# Patient Record
Sex: Male | Born: 1935 | Race: White | Hispanic: No | Marital: Married | Smoking: Former smoker
Health system: Southern US, Community
[De-identification: ages and names within clinical notes are randomized; demographics above are authoritative.]

## PROBLEM LIST (undated history)

## (undated) DIAGNOSIS — N400 Enlarged prostate without lower urinary tract symptoms: Secondary | ICD-10-CM

## (undated) DIAGNOSIS — Z8619 Personal history of other infectious and parasitic diseases: Secondary | ICD-10-CM

## (undated) DIAGNOSIS — N200 Calculus of kidney: Secondary | ICD-10-CM

## (undated) DIAGNOSIS — D494 Neoplasm of unspecified behavior of bladder: Secondary | ICD-10-CM

## (undated) DIAGNOSIS — N189 Chronic kidney disease, unspecified: Secondary | ICD-10-CM

## (undated) DIAGNOSIS — E878 Other disorders of electrolyte and fluid balance, not elsewhere classified: Secondary | ICD-10-CM

## (undated) DIAGNOSIS — R011 Cardiac murmur, unspecified: Secondary | ICD-10-CM

## (undated) DIAGNOSIS — M1990 Unspecified osteoarthritis, unspecified site: Secondary | ICD-10-CM

## (undated) DIAGNOSIS — C801 Malignant (primary) neoplasm, unspecified: Secondary | ICD-10-CM

## (undated) DIAGNOSIS — R319 Hematuria, unspecified: Secondary | ICD-10-CM

## (undated) DIAGNOSIS — K219 Gastro-esophageal reflux disease without esophagitis: Secondary | ICD-10-CM

## (undated) DIAGNOSIS — R972 Elevated prostate specific antigen [PSA]: Secondary | ICD-10-CM

## (undated) DIAGNOSIS — Z7901 Long term (current) use of anticoagulants: Secondary | ICD-10-CM

## (undated) DIAGNOSIS — R55 Syncope and collapse: Secondary | ICD-10-CM

## (undated) DIAGNOSIS — I5042 Chronic combined systolic (congestive) and diastolic (congestive) heart failure: Secondary | ICD-10-CM

## (undated) DIAGNOSIS — I10 Essential (primary) hypertension: Secondary | ICD-10-CM

## (undated) DIAGNOSIS — R339 Retention of urine, unspecified: Secondary | ICD-10-CM

## (undated) DIAGNOSIS — Z85828 Personal history of other malignant neoplasm of skin: Secondary | ICD-10-CM

## (undated) DIAGNOSIS — R569 Unspecified convulsions: Secondary | ICD-10-CM

## (undated) DIAGNOSIS — C4490 Unspecified malignant neoplasm of skin, unspecified: Secondary | ICD-10-CM

## (undated) DIAGNOSIS — N4889 Other specified disorders of penis: Secondary | ICD-10-CM

## (undated) DIAGNOSIS — Z87442 Personal history of urinary calculi: Secondary | ICD-10-CM

## (undated) DIAGNOSIS — M109 Gout, unspecified: Secondary | ICD-10-CM

## (undated) DIAGNOSIS — I482 Chronic atrial fibrillation, unspecified: Secondary | ICD-10-CM

## (undated) DIAGNOSIS — R8281 Pyuria: Secondary | ICD-10-CM

## (undated) DIAGNOSIS — S3730XA Unspecified injury of urethra, initial encounter: Secondary | ICD-10-CM

## (undated) HISTORY — DX: Unspecified malignant neoplasm of skin, unspecified: C44.90

## (undated) HISTORY — DX: Chronic atrial fibrillation, unspecified: I48.20

## (undated) HISTORY — DX: Calculus of kidney: N20.0

## (undated) HISTORY — DX: Gout, unspecified: M10.9

## (undated) HISTORY — DX: Hematuria, unspecified: R31.9

## (undated) HISTORY — DX: Retention of urine, unspecified: R33.9

## (undated) HISTORY — DX: Other specified disorders of penis: N48.89

## (undated) HISTORY — DX: Elevated prostate specific antigen (PSA): R97.20

## (undated) HISTORY — DX: Benign prostatic hyperplasia without lower urinary tract symptoms: N40.0

## (undated) HISTORY — DX: Pyuria: R82.81

## (undated) HISTORY — DX: Long term (current) use of anticoagulants: Z79.01

## (undated) HISTORY — DX: Other disorders of electrolyte and fluid balance, not elsewhere classified: E87.8

## (undated) HISTORY — DX: Cardiac murmur, unspecified: R01.1

## (undated) HISTORY — DX: Unspecified osteoarthritis, unspecified site: M19.90

## (undated) HISTORY — DX: Personal history of urinary calculi: Z87.442

## (undated) HISTORY — DX: Syncope and collapse: R55

## (undated) HISTORY — PX: SKIN BIOPSY: SHX1

## (undated) HISTORY — DX: Unspecified injury of urethra, initial encounter: S37.30XA

## (undated) HISTORY — PX: CATARACT EXTRACTION: SUR2

## (undated) HISTORY — DX: Gastro-esophageal reflux disease without esophagitis: K21.9

## (undated) HISTORY — DX: Essential (primary) hypertension: I10

---

## 1985-01-09 HISTORY — PX: AORTIC VALVE REPLACEMENT: SHX41

## 2008-11-15 ENCOUNTER — Emergency Department (HOSPITAL_COMMUNITY): Admission: EM | Admit: 2008-11-15 | Discharge: 2008-11-15 | Payer: Self-pay | Admitting: Emergency Medicine

## 2009-11-08 ENCOUNTER — Ambulatory Visit: Payer: Self-pay | Admitting: Cardiology

## 2009-11-08 ENCOUNTER — Inpatient Hospital Stay (HOSPITAL_COMMUNITY): Admission: AD | Admit: 2009-11-08 | Discharge: 2009-11-12 | Payer: Self-pay | Admitting: Cardiology

## 2009-11-09 ENCOUNTER — Encounter: Payer: Self-pay | Admitting: Cardiology

## 2009-11-15 ENCOUNTER — Ambulatory Visit: Payer: Self-pay | Admitting: Cardiology

## 2009-11-19 ENCOUNTER — Ambulatory Visit: Payer: Self-pay | Admitting: Cardiology

## 2009-11-25 ENCOUNTER — Ambulatory Visit: Payer: Self-pay | Admitting: Cardiology

## 2009-12-03 ENCOUNTER — Ambulatory Visit: Payer: Self-pay | Admitting: Cardiovascular Disease

## 2009-12-13 ENCOUNTER — Ambulatory Visit: Payer: Self-pay | Admitting: Cardiology

## 2009-12-24 ENCOUNTER — Ambulatory Visit: Payer: Self-pay | Admitting: Cardiology

## 2010-01-05 ENCOUNTER — Ambulatory Visit: Payer: Self-pay | Admitting: Cardiology

## 2010-02-28 ENCOUNTER — Ambulatory Visit (INDEPENDENT_AMBULATORY_CARE_PROVIDER_SITE_OTHER): Payer: Medicare Other | Admitting: *Deleted

## 2010-02-28 DIAGNOSIS — Z954 Presence of other heart-valve replacement: Secondary | ICD-10-CM

## 2010-02-28 DIAGNOSIS — I4891 Unspecified atrial fibrillation: Secondary | ICD-10-CM

## 2010-02-28 DIAGNOSIS — Z7901 Long term (current) use of anticoagulants: Secondary | ICD-10-CM

## 2010-02-28 DIAGNOSIS — E7800 Pure hypercholesterolemia, unspecified: Secondary | ICD-10-CM

## 2010-03-22 LAB — PROTIME-INR
INR: 2.94 — ABNORMAL HIGH (ref 0.00–1.49)
INR: 3.04 — ABNORMAL HIGH (ref 0.00–1.49)
INR: 3.2 — ABNORMAL HIGH (ref 0.00–1.49)
INR: 3.2 — ABNORMAL HIGH (ref 0.00–1.49)
Prothrombin Time: 30.7 seconds — ABNORMAL HIGH (ref 11.6–15.2)
Prothrombin Time: 31.5 seconds — ABNORMAL HIGH (ref 11.6–15.2)
Prothrombin Time: 32.8 seconds — ABNORMAL HIGH (ref 11.6–15.2)
Prothrombin Time: 32.8 seconds — ABNORMAL HIGH (ref 11.6–15.2)

## 2010-03-22 LAB — BASIC METABOLIC PANEL
BUN: 31 mg/dL — ABNORMAL HIGH (ref 6–23)
BUN: 31 mg/dL — ABNORMAL HIGH (ref 6–23)
BUN: 33 mg/dL — ABNORMAL HIGH (ref 6–23)
CO2: 33 mEq/L — ABNORMAL HIGH (ref 19–32)
CO2: 38 mEq/L — ABNORMAL HIGH (ref 19–32)
Calcium: 8.7 mg/dL (ref 8.4–10.5)
Calcium: 8.7 mg/dL (ref 8.4–10.5)
Chloride: 100 mEq/L (ref 96–112)
Chloride: 101 mEq/L (ref 96–112)
Chloride: 98 mEq/L (ref 96–112)
Creatinine, Ser: 1.1 mg/dL (ref 0.4–1.5)
Creatinine, Ser: 1.34 mg/dL (ref 0.4–1.5)
Creatinine, Ser: 1.41 mg/dL (ref 0.4–1.5)
GFR calc Af Amer: 57 mL/min — ABNORMAL LOW (ref >60)
GFR calc Af Amer: 59 mL/min — ABNORMAL LOW (ref >60)
GFR calc non Af Amer: 47 mL/min — ABNORMAL LOW (ref >60)
GFR calc non Af Amer: 50 mL/min — ABNORMAL LOW (ref >60)
GFR calc non Af Amer: >60 mL/min (ref >60)
Glucose, Bld: 104 mg/dL — ABNORMAL HIGH (ref 70–99)
Glucose, Bld: 111 mg/dL — ABNORMAL HIGH (ref 70–99)
Glucose, Bld: 116 mg/dL — ABNORMAL HIGH (ref 70–99)
Glucose, Bld: 118 mg/dL — ABNORMAL HIGH (ref 70–99)
Potassium: 3.4 mEq/L — ABNORMAL LOW (ref 3.5–5.1)
Potassium: 3.7 mEq/L (ref 3.5–5.1)
Potassium: 3.9 mEq/L (ref 3.5–5.1)
Sodium: 141 mEq/L (ref 135–145)
Sodium: 142 mEq/L (ref 135–145)
Sodium: 143 mEq/L (ref 135–145)
Sodium: 144 mEq/L (ref 135–145)

## 2010-03-22 LAB — BRAIN NATRIURETIC PEPTIDE
Pro B Natriuretic peptide (BNP): 160 pg/mL — ABNORMAL HIGH (ref 0.0–100.0)
Pro B Natriuretic peptide (BNP): 260 pg/mL — ABNORMAL HIGH (ref 0.0–100.0)
Pro B Natriuretic peptide (BNP): 261 pg/mL — ABNORMAL HIGH (ref 0.0–100.0)
Pro B Natriuretic peptide (BNP): 373 pg/mL — ABNORMAL HIGH (ref 0.0–100.0)

## 2010-03-22 LAB — CARDIAC PANEL(CRET KIN+CKTOT+MB+TROPI)
CK, MB: 5.3 ng/mL — ABNORMAL HIGH (ref 0.3–4.0)
Relative Index: 3.9 — ABNORMAL HIGH (ref 0.0–2.5)
Troponin I: 0.02 ng/mL (ref 0.00–0.06)

## 2010-03-23 LAB — TSH: TSH: 1.752 u[IU]/mL (ref 0.350–4.500)

## 2010-03-23 LAB — CARDIAC PANEL(CRET KIN+CKTOT+MB+TROPI)
CK, MB: 6 ng/mL — ABNORMAL HIGH (ref 0.3–4.0)
CK, MB: 6.3 ng/mL (ref 0.3–4.0)
Relative Index: 3.9 — ABNORMAL HIGH (ref 0.0–2.5)
Total CK: 155 U/L (ref 7–232)
Total CK: 157 U/L (ref 7–232)
Troponin I: 0.03 ng/mL (ref 0.00–0.06)

## 2010-03-23 LAB — COMPREHENSIVE METABOLIC PANEL
ALT: 19 U/L (ref 0–53)
AST: 20 U/L (ref 0–37)
Albumin: 3.4 g/dL — ABNORMAL LOW (ref 3.5–5.2)
Alkaline Phosphatase: 93 U/L (ref 39–117)
BUN: 36 mg/dL — ABNORMAL HIGH (ref 6–23)
CO2: 32 mEq/L (ref 19–32)
Chloride: 107 mEq/L (ref 96–112)
Creatinine, Ser: 1.02 mg/dL (ref 0.4–1.5)
GFR calc Af Amer: >60 mL/min (ref >60)
GFR calc non Af Amer: >60 mL/min (ref >60)
Glucose, Bld: 115 mg/dL — ABNORMAL HIGH (ref 70–99)
Sodium: 145 mEq/L (ref 135–145)
Total Bilirubin: 1 mg/dL (ref 0.3–1.2)
Total Protein: 7 g/dL (ref 6.0–8.3)

## 2010-03-23 LAB — BRAIN NATRIURETIC PEPTIDE: Pro B Natriuretic peptide (BNP): 759 pg/mL — ABNORMAL HIGH (ref 0.0–100.0)

## 2010-03-23 LAB — CBC
HCT: 33.9 % — ABNORMAL LOW (ref 39.0–52.0)
Hemoglobin: 10.2 g/dL — ABNORMAL LOW (ref 13.0–17.0)
MCH: 28.3 pg (ref 26.0–34.0)
MCHC: 30.1 g/dL (ref 30.0–36.0)
MCV: 94.2 fL (ref 78.0–100.0)
Platelets: 186 10*3/uL (ref 150–400)
RBC: 3.6 MIL/uL — ABNORMAL LOW (ref 4.22–5.81)
WBC: 9.1 10*3/uL (ref 4.0–10.5)

## 2010-03-23 LAB — APTT: aPTT: 45 seconds — ABNORMAL HIGH (ref 24–37)

## 2010-03-23 LAB — PROTIME-INR: INR: 3.09 — ABNORMAL HIGH (ref 0.00–1.49)

## 2010-04-13 LAB — URINE CULTURE
Colony Count: NO GROWTH
Culture: NO GROWTH

## 2010-04-13 LAB — URINALYSIS, ROUTINE W REFLEX MICROSCOPIC
Glucose, UA: NEGATIVE mg/dL
Ketones, ur: 15 mg/dL — AB
Nitrite: POSITIVE — AB
Protein, ur: >300 mg/dL — AB
Specific Gravity, Urine: 1.027 (ref 1.005–1.030)
Urobilinogen, UA: 1 mg/dL (ref 0.0–1.0)
pH: 5 (ref 5.0–8.0)

## 2010-04-13 LAB — URINE MICROSCOPIC-ADD ON

## 2010-04-13 LAB — PROTIME-INR
INR: 2.33 — ABNORMAL HIGH (ref 0.00–1.49)
Prothrombin Time: 25.4 s — ABNORMAL HIGH (ref 11.6–15.2)

## 2010-06-10 ENCOUNTER — Other Ambulatory Visit: Payer: Self-pay | Admitting: *Deleted

## 2010-06-10 DIAGNOSIS — E785 Hyperlipidemia, unspecified: Secondary | ICD-10-CM

## 2010-06-10 MED ORDER — CARVEDILOL 6.25 MG PO TABS: 6.2500 mg | ORAL_TABLET | Freq: Two times a day (BID) | ORAL | Status: DC

## 2010-06-10 MED ORDER — SIMVASTATIN 10 MG PO TABS: 10.0000 mg | ORAL_TABLET | Freq: Every evening | ORAL | Status: DC

## 2010-06-13 ENCOUNTER — Other Ambulatory Visit (INDEPENDENT_AMBULATORY_CARE_PROVIDER_SITE_OTHER): Payer: Medicare Other | Admitting: *Deleted

## 2010-06-13 DIAGNOSIS — E785 Hyperlipidemia, unspecified: Secondary | ICD-10-CM

## 2010-06-13 LAB — BASIC METABOLIC PANEL
CO2: 32 mEq/L (ref 19–32)
Calcium: 8.7 mg/dL (ref 8.4–10.5)
GFR: 59.49 mL/min — ABNORMAL LOW (ref >60.00)
Potassium: 4.4 mEq/L (ref 3.5–5.1)
Sodium: 140 mEq/L (ref 135–145)

## 2010-06-13 LAB — HEPATIC FUNCTION PANEL
Alkaline Phosphatase: 85 U/L (ref 39–117)
Bilirubin, Direct: 0.2 mg/dL (ref 0.0–0.3)
Total Protein: 7 g/dL (ref 6.0–8.3)

## 2010-06-13 LAB — LIPID PANEL
HDL: 41.8 mg/dL (ref >39.00)
Total CHOL/HDL Ratio: 3
VLDL: 15.2 mg/dL (ref 0.0–40.0)

## 2010-06-17 ENCOUNTER — Telehealth: Payer: Self-pay | Admitting: *Deleted

## 2010-06-17 NOTE — Telephone Encounter (Signed)
Left message on home # that labs are ok and to have repeat labs in six months.  Pt told to call with any concerns.

## 2010-06-17 NOTE — Telephone Encounter (Signed)
Message copied by Adolphus Birchwood on Fri Jun 17, 2010  1:15 PM ------      Message from: Roger Shelter      Created: Wed Jun 15, 2010  8:32 AM       Ok, recheck in six months.

## 2010-06-20 ENCOUNTER — Other Ambulatory Visit: Payer: Self-pay | Admitting: *Deleted

## 2010-06-20 MED ORDER — POTASSIUM CHLORIDE CRYS ER 20 MEQ PO TBCR: 20.0000 meq | EXTENDED_RELEASE_TABLET | Freq: Two times a day (BID) | ORAL | Status: DC

## 2010-07-20 ENCOUNTER — Other Ambulatory Visit: Payer: Self-pay | Admitting: Cardiology

## 2010-07-20 NOTE — Telephone Encounter (Signed)
Med refill

## 2010-08-15 ENCOUNTER — Encounter: Payer: Self-pay | Admitting: Nurse Practitioner

## 2010-08-22 ENCOUNTER — Other Ambulatory Visit: Payer: PRIVATE HEALTH INSURANCE | Admitting: *Deleted

## 2010-08-22 ENCOUNTER — Ambulatory Visit (INDEPENDENT_AMBULATORY_CARE_PROVIDER_SITE_OTHER): Payer: Medicare Other | Admitting: Nurse Practitioner

## 2010-08-22 ENCOUNTER — Encounter: Payer: Self-pay | Admitting: Nurse Practitioner

## 2010-08-22 DIAGNOSIS — I4891 Unspecified atrial fibrillation: Secondary | ICD-10-CM

## 2010-08-22 DIAGNOSIS — I5020 Unspecified systolic (congestive) heart failure: Secondary | ICD-10-CM

## 2010-08-22 DIAGNOSIS — E785 Hyperlipidemia, unspecified: Secondary | ICD-10-CM

## 2010-08-22 DIAGNOSIS — I359 Nonrheumatic aortic valve disorder, unspecified: Secondary | ICD-10-CM

## 2010-08-22 DIAGNOSIS — R0600 Dyspnea, unspecified: Secondary | ICD-10-CM

## 2010-08-22 DIAGNOSIS — I482 Chronic atrial fibrillation, unspecified: Secondary | ICD-10-CM

## 2010-08-22 DIAGNOSIS — I5032 Chronic diastolic (congestive) heart failure: Secondary | ICD-10-CM | POA: Insufficient documentation

## 2010-08-22 DIAGNOSIS — Z952 Presence of prosthetic heart valve: Secondary | ICD-10-CM | POA: Insufficient documentation

## 2010-08-22 DIAGNOSIS — R0609 Other forms of dyspnea: Secondary | ICD-10-CM

## 2010-08-22 NOTE — Progress Notes (Signed)
    Eric Wheeler Date of Birth: April 20, 1919   History of Present Illness: Eric Wheeler is seen today for his 4 month check. He is seen for Dr. Antoine Poche. He is a former patient of Dr. Ronnald Nian. He is 77 years old. He is doing well. He gets short winded if he overdoes, but it seems to be at baseline. No chest pain. He is not dizzy. He has had a flare up of his gout. He is on coumadin and gets his protimes with Dr. Jeannetta Nap. He does bruise easily. No active bleeding reported. His weight is staying good at home. He is tolerating his medicines.   Current Outpatient Prescriptions on File Prior to Visit  Medication Sig Dispense Refill  . carvedilol (COREG) 6.25 MG tablet Take 1 tablet (6.25 mg total) by mouth 2 (two) times daily.  60 tablet  6  . diltiazem (CARDIZEM CD) 120 MG 24 hr capsule Take 120 mg by mouth daily.        . furosemide (LASIX) 40 MG tablet Take 40 mg by mouth daily.        . potassium chloride SA (K-DUR,KLOR-CON) 20 MEQ tablet Take 1 tablet (20 mEq total) by mouth 2 (two) times daily.  60 tablet  6  . simvastatin (ZOCOR) 10 MG tablet TAKE ONE TABLET BY MOUTH IN THE EVENING  30 tablet  5  . terazosin (HYTRIN) 5 MG capsule Take 5 mg by mouth at bedtime.        [name] warfarin (COUMADIN) 5 MG tablet Take 5 mg by mouth as directed.          Allergies  Allergen Reactions  . Codeine   . Sulfa Drugs Cross Reactors     Past Medical History  Diagnosis Date  . Chronic atrial fibrillation   . Chronic anticoagulation     followed by Dr. Jeannetta Nap  . BPH (benign prostatic hypertrophy)   . Hypertension   . History of kidney stones   . Systolic heart failure     EF 45 to 50% per echo in 2011  . BPH (benign prostatic hyperplasia)   . S/P AVR 1987    Dr. Andrey Campanile    Past Surgical History  Procedure Date  . Aortic valve replacement 1987    #21 MM ST JUDE PLACED BY DR.CHARLES WILSON  . Cataract extraction     History  Smoking status  . Former Smoker  . Quit date: 01/10/1971    Smokeless tobacco  . Not on file    History  Alcohol Use No    Family History  Problem Relation Age of Onset  . Cancer Mother   . Heart disease Father   . Asthma Father   . Stroke Father     Review of Systems: The review of systems is positive for recent flare up of gout.  All other systems were reviewed and are negative.  Physical Exam: BP 120/74  Pulse 86  Ht 5\' 9"  (1.753 m)  Wt 173 lb 3.2 oz (78.563 kg)  BMI 25.58 kg/m2  LABORATORY DATA: n/a   Assessment / Plan:

## 2010-08-22 NOTE — Assessment & Plan Note (Signed)
He looks compensated. Weights are stable at home. No change in his therapy. He has not tolerated ACE in the past due to dizziness. I will see him back in about 4 months. Patient is agreeable to this plan and will call if any problems develop in the interim.

## 2010-08-22 NOTE — Patient Instructions (Addendum)
Stay on your current medicines I will see you back in 4 months. We will check labs at that time. Come fasting. Call for any problems.

## 2010-08-22 NOTE — Assessment & Plan Note (Signed)
Rate is controlled. No change in his medicines. Protimes are monitored by primary care.

## 2010-08-22 NOTE — Assessment & Plan Note (Signed)
Remote AVR in 1987. Last echo was in 2011.

## 2010-11-17 ENCOUNTER — Other Ambulatory Visit: Payer: Self-pay | Admitting: Physician Assistant

## 2010-11-17 ENCOUNTER — Other Ambulatory Visit: Payer: Self-pay | Admitting: *Deleted

## 2010-12-22 ENCOUNTER — Other Ambulatory Visit: Payer: Medicare Other | Admitting: *Deleted

## 2010-12-22 ENCOUNTER — Ambulatory Visit (INDEPENDENT_AMBULATORY_CARE_PROVIDER_SITE_OTHER): Payer: Medicare Other | Admitting: Nurse Practitioner

## 2010-12-22 ENCOUNTER — Encounter: Payer: Self-pay | Admitting: Nurse Practitioner

## 2010-12-22 ENCOUNTER — Other Ambulatory Visit (INDEPENDENT_AMBULATORY_CARE_PROVIDER_SITE_OTHER): Payer: Medicare Other | Admitting: *Deleted

## 2010-12-22 DIAGNOSIS — I4891 Unspecified atrial fibrillation: Secondary | ICD-10-CM

## 2010-12-22 DIAGNOSIS — I359 Nonrheumatic aortic valve disorder, unspecified: Secondary | ICD-10-CM

## 2010-12-22 DIAGNOSIS — Z952 Presence of prosthetic heart valve: Secondary | ICD-10-CM

## 2010-12-22 DIAGNOSIS — R0600 Dyspnea, unspecified: Secondary | ICD-10-CM

## 2010-12-22 DIAGNOSIS — E785 Hyperlipidemia, unspecified: Secondary | ICD-10-CM

## 2010-12-22 DIAGNOSIS — I5020 Unspecified systolic (congestive) heart failure: Secondary | ICD-10-CM

## 2010-12-22 DIAGNOSIS — R0989 Other specified symptoms and signs involving the circulatory and respiratory systems: Secondary | ICD-10-CM

## 2010-12-22 DIAGNOSIS — R0609 Other forms of dyspnea: Secondary | ICD-10-CM

## 2010-12-22 DIAGNOSIS — I482 Chronic atrial fibrillation, unspecified: Secondary | ICD-10-CM

## 2010-12-22 LAB — BASIC METABOLIC PANEL
BUN: 29 mg/dL — ABNORMAL HIGH (ref 6–23)
CO2: 30 mEq/L (ref 19–32)
Calcium: 8.8 mg/dL (ref 8.4–10.5)
Chloride: 103 mEq/L (ref 96–112)
Creatinine, Ser: 1.3 mg/dL (ref 0.4–1.5)
GFR: 53.32 mL/min — ABNORMAL LOW (ref >60.00)
Glucose, Bld: 106 mg/dL — ABNORMAL HIGH (ref 70–99)
Potassium: 4.2 mEq/L (ref 3.5–5.1)
Sodium: 140 mEq/L (ref 135–145)

## 2010-12-22 LAB — BRAIN NATRIURETIC PEPTIDE: Pro B Natriuretic peptide (BNP): 235 pg/mL — ABNORMAL HIGH (ref 0.0–100.0)

## 2010-12-22 LAB — LIPID PANEL
Cholesterol: 146 mg/dL (ref 0–200)
HDL: 37.1 mg/dL — ABNORMAL LOW (ref >39.00)
LDL Cholesterol: 96 mg/dL (ref 0–99)
Total CHOL/HDL Ratio: 4
Triglycerides: 67 mg/dL (ref 0.0–149.0)
VLDL: 13.4 mg/dL (ref 0.0–40.0)

## 2010-12-22 LAB — HEPATIC FUNCTION PANEL
ALT: 16 U/L (ref 0–53)
AST: 17 U/L (ref 0–37)
Albumin: 3.9 g/dL (ref 3.5–5.2)
Alkaline Phosphatase: 82 U/L (ref 39–117)
Bilirubin, Direct: 0.1 mg/dL (ref 0.0–0.3)
Total Bilirubin: 0.6 mg/dL (ref 0.3–1.2)
Total Protein: 7.3 g/dL (ref 6.0–8.3)

## 2010-12-22 NOTE — Assessment & Plan Note (Signed)
Has had remote AVR. Last echo was in 2011. He is doing well for his 77 years of age.

## 2010-12-22 NOTE — Assessment & Plan Note (Signed)
His rate is controlled. He remains on his coumadin. This is managed by his PCP.

## 2010-12-22 NOTE — Assessment & Plan Note (Signed)
He looks compensated. No complaints today. We are checking labs. Will tentatively see him back in 6 months. Patient is agreeable to this plan and will call if any problems develop in the interim.

## 2010-12-22 NOTE — Patient Instructions (Signed)
Stay on your current medicines.  I think you are doing well.   We will see you back in about 6 months with Dr. Antoine Poche.   Call the [facility] office at [phone] if you have any questions, problems or concerns.

## 2010-12-22 NOTE — Progress Notes (Signed)
Eric Wheeler Date of Birth: 18-Aug-1919 Medical Record #[id number]  History of Present Illness: Eric Wheeler is seen back today for a 4 month visit. He is seen for Dr. Antoine Poche. He is a former patient of Dr. Ronnald Nian. He has chronic systolic dysfunction with an EF of 45 to 50%, prior AVR, HTN and chronic atrial fib. He was hospitalized a little over one year ago with a CHF exacerbation. He sees Dr. Jeannetta Nap for primary care. He is doing well. Has just gotten over a cold. His wife is with him today and she notes that he does a lot of things he shouldn't. He is trying to stay active. Says he is "shooting for 100". No chest pain. Dyspnea is at baseline. No adverse bruising/bleeding with his coumadin. Overall, he feels well.   Current Outpatient Prescriptions on File Prior to Visit  Medication Sig Dispense Refill  . carvedilol (COREG) 6.25 MG tablet Take 1 tablet (6.25 mg total) by mouth 2 (two) times daily.  60 tablet  6  . diltiazem (CARDIZEM CD) 120 MG 24 hr capsule Take 120 mg by mouth daily.        . furosemide (LASIX) 40 MG tablet TAKE ONE TABLET BY MOUTH TWICE DAILY  60 tablet  3  . potassium chloride SA (K-DUR,KLOR-CON) 20 MEQ tablet Take 1 tablet (20 mEq total) by mouth 2 (two) times daily.  60 tablet  6  . simvastatin (ZOCOR) 10 MG tablet TAKE ONE TABLET BY MOUTH IN THE EVENING  30 tablet  5  . terazosin (HYTRIN) 5 MG capsule Take 5 mg by mouth at bedtime.        [name] warfarin (COUMADIN) 5 MG tablet Take 5 mg by mouth as directed.          Allergies  Allergen Reactions  . Ace Inhibitors     Dizziness   . Codeine   . Sulfa Drugs Cross Reactors     Past Medical History  Diagnosis Date  . Chronic atrial fibrillation   . Chronic anticoagulation     followed by Dr. Jeannetta Nap  . BPH (benign prostatic hypertrophy)   . Hypertension   . History of kidney stones   . Systolic heart failure     EF 45 to 50% per echo in 2011  . BPH (benign prostatic hyperplasia)   . S/P AVR 1987    Dr.  Andrey Campanile    Past Surgical History  Procedure Date  . Aortic valve replacement 1987    #21 MM ST JUDE PLACED BY DR.CHARLES WILSON  . Cataract extraction     History  Smoking status  . Former Smoker  . Quit date: 01/10/1971  Smokeless tobacco  . Not on file    History  Alcohol Use No    Family History  Problem Relation Age of Onset  . Cancer Mother   . Heart disease Father   . Asthma Father   . Stroke Father     Review of Systems: The review of systems is per the HPI.  All other systems were reviewed and are negative.  Physical Exam: Pulse 76  Ht 5\' 9"  (1.753 m)  Wt 172 lb (78.019 kg)  BMI 25.40 kg/m2 Patient is very pleasant and in no acute distress. Skin is warm and dry. Color is normal.  HEENT is unremarkable. Normocephalic/atraumatic. PERRL. Sclera are nonicteric. Neck is supple. No masses. No JVD. Lungs are clear. Cardiac exam shows an irregular rhythm.Rate is controlled. Valve is crisp.  Abdomen is soft.  Extremities are without edema. Gait and ROM are intact. No gross neurologic deficits noted.  LABORATORY DATA: PENDING   Assessment / Plan:

## 2011-01-12 ENCOUNTER — Other Ambulatory Visit: Payer: Self-pay | Admitting: Nurse Practitioner

## 2011-01-13 DIAGNOSIS — I359 Nonrheumatic aortic valve disorder, unspecified: Secondary | ICD-10-CM | POA: Diagnosis not present

## 2011-01-13 DIAGNOSIS — Z7901 Long term (current) use of anticoagulants: Secondary | ICD-10-CM | POA: Diagnosis not present

## 2011-01-16 ENCOUNTER — Other Ambulatory Visit: Payer: Self-pay | Admitting: Cardiology

## 2011-02-09 DIAGNOSIS — Z7901 Long term (current) use of anticoagulants: Secondary | ICD-10-CM | POA: Diagnosis not present

## 2011-02-09 DIAGNOSIS — I359 Nonrheumatic aortic valve disorder, unspecified: Secondary | ICD-10-CM | POA: Diagnosis not present

## 2011-02-14 DIAGNOSIS — Z85828 Personal history of other malignant neoplasm of skin: Secondary | ICD-10-CM | POA: Diagnosis not present

## 2011-03-09 DIAGNOSIS — I359 Nonrheumatic aortic valve disorder, unspecified: Secondary | ICD-10-CM | POA: Diagnosis not present

## 2011-04-05 DIAGNOSIS — Z7901 Long term (current) use of anticoagulants: Secondary | ICD-10-CM | POA: Diagnosis not present

## 2011-04-05 DIAGNOSIS — I359 Nonrheumatic aortic valve disorder, unspecified: Secondary | ICD-10-CM | POA: Diagnosis not present

## 2011-04-05 DIAGNOSIS — I059 Rheumatic mitral valve disease, unspecified: Secondary | ICD-10-CM | POA: Diagnosis not present

## 2011-04-26 ENCOUNTER — Other Ambulatory Visit: Payer: Self-pay | Admitting: Cardiology

## 2011-05-03 DIAGNOSIS — N138 Other obstructive and reflux uropathy: Secondary | ICD-10-CM | POA: Diagnosis not present

## 2011-05-03 DIAGNOSIS — R3129 Other microscopic hematuria: Secondary | ICD-10-CM | POA: Diagnosis not present

## 2011-05-03 DIAGNOSIS — N403 Nodular prostate with lower urinary tract symptoms: Secondary | ICD-10-CM | POA: Diagnosis not present

## 2011-05-04 DIAGNOSIS — I359 Nonrheumatic aortic valve disorder, unspecified: Secondary | ICD-10-CM | POA: Diagnosis not present

## 2011-06-15 DIAGNOSIS — I359 Nonrheumatic aortic valve disorder, unspecified: Secondary | ICD-10-CM | POA: Diagnosis not present

## 2011-06-29 DIAGNOSIS — Z7901 Long term (current) use of anticoagulants: Secondary | ICD-10-CM | POA: Diagnosis not present

## 2011-06-29 DIAGNOSIS — I359 Nonrheumatic aortic valve disorder, unspecified: Secondary | ICD-10-CM | POA: Diagnosis not present

## 2011-07-04 ENCOUNTER — Other Ambulatory Visit: Payer: Self-pay | Admitting: Nurse Practitioner

## 2011-07-12 ENCOUNTER — Other Ambulatory Visit: Payer: Self-pay | Admitting: Nurse Practitioner

## 2011-07-14 NOTE — Telephone Encounter (Signed)
Fax Received. Refill Completed. Eric Wheeler (R.M.A)   

## 2011-07-24 ENCOUNTER — Other Ambulatory Visit: Payer: Self-pay | Admitting: Nurse Practitioner

## 2011-07-27 DIAGNOSIS — I359 Nonrheumatic aortic valve disorder, unspecified: Secondary | ICD-10-CM | POA: Diagnosis not present

## 2011-07-27 DIAGNOSIS — Z7901 Long term (current) use of anticoagulants: Secondary | ICD-10-CM | POA: Diagnosis not present

## 2011-08-08 DIAGNOSIS — T07XXXA Unspecified multiple injuries, initial encounter: Secondary | ICD-10-CM | POA: Diagnosis not present

## 2011-08-08 DIAGNOSIS — I499 Cardiac arrhythmia, unspecified: Secondary | ICD-10-CM | POA: Diagnosis not present

## 2011-08-10 DIAGNOSIS — I359 Nonrheumatic aortic valve disorder, unspecified: Secondary | ICD-10-CM | POA: Diagnosis not present

## 2011-08-15 DIAGNOSIS — Z85828 Personal history of other malignant neoplasm of skin: Secondary | ICD-10-CM | POA: Diagnosis not present

## 2011-08-15 DIAGNOSIS — L570 Actinic keratosis: Secondary | ICD-10-CM | POA: Diagnosis not present

## 2011-09-07 DIAGNOSIS — I359 Nonrheumatic aortic valve disorder, unspecified: Secondary | ICD-10-CM | POA: Diagnosis not present

## 2011-10-05 DIAGNOSIS — I359 Nonrheumatic aortic valve disorder, unspecified: Secondary | ICD-10-CM | POA: Diagnosis not present

## 2011-11-02 DIAGNOSIS — I359 Nonrheumatic aortic valve disorder, unspecified: Secondary | ICD-10-CM | POA: Diagnosis not present

## 2011-11-06 DIAGNOSIS — N403 Nodular prostate with lower urinary tract symptoms: Secondary | ICD-10-CM | POA: Diagnosis not present

## 2011-11-06 DIAGNOSIS — N138 Other obstructive and reflux uropathy: Secondary | ICD-10-CM | POA: Diagnosis not present

## 2011-11-27 ENCOUNTER — Other Ambulatory Visit: Payer: Self-pay | Admitting: Cardiology

## 2011-11-27 NOTE — Telephone Encounter (Signed)
..   Requested Prescriptions   Pending Prescriptions Disp Refills  . carvedilol (COREG) 6.25 MG tablet [Pharmacy Med Name: CARVEDILOL 6.25MG   TAB] 60 tablet 3    Sig: TAKE ONE TABLET BY MOUTH TWICE DAILY   

## 2011-12-14 DIAGNOSIS — I359 Nonrheumatic aortic valve disorder, unspecified: Secondary | ICD-10-CM | POA: Diagnosis not present

## 2012-01-10 DIAGNOSIS — R569 Unspecified convulsions: Secondary | ICD-10-CM

## 2012-01-10 HISTORY — DX: Unspecified convulsions: R56.9

## 2012-01-15 ENCOUNTER — Other Ambulatory Visit: Payer: Self-pay | Admitting: Nurse Practitioner

## 2012-02-13 ENCOUNTER — Other Ambulatory Visit: Payer: Self-pay | Admitting: Nurse Practitioner

## 2012-02-13 ENCOUNTER — Telehealth: Payer: Self-pay | Admitting: *Deleted

## 2012-02-13 NOTE — Telephone Encounter (Signed)
I was trying to refill pts simvastatin needed appt. Called pt made an appt to see Norma Fredrickson on 03/01/12 will refill script today. Pt wife verbally understood and repeated appt date and time back

## 2012-03-01 ENCOUNTER — Ambulatory Visit (INDEPENDENT_AMBULATORY_CARE_PROVIDER_SITE_OTHER): Payer: Medicare Other | Admitting: Nurse Practitioner

## 2012-03-01 ENCOUNTER — Encounter: Payer: Self-pay | Admitting: Nurse Practitioner

## 2012-03-01 VITALS — BP 162/78 | HR 88 | Ht 69.0 in | Wt 169.8 lb

## 2012-03-01 DIAGNOSIS — I5022 Chronic systolic (congestive) heart failure: Secondary | ICD-10-CM

## 2012-03-01 MED ORDER — FUROSEMIDE 40 MG PO TABS: 40.0000 mg | ORAL_TABLET | Freq: Two times a day (BID) | ORAL | Status: DC

## 2012-03-01 NOTE — Patient Instructions (Addendum)
Stay on your current medicines - I have refilled the Lasix today  Stay active  I will see you in 6 months  Call the  [facility] office at [phone] if you have any questions, problems or concerns.

## 2012-03-01 NOTE — Progress Notes (Signed)
Jenelle Mages Date of Birth: May 18, 1919 Medical Record #[id number]  History of Present Illness: Mr. Sagan is seen back today for a follow up visit. He is seen for Dr. Antoine Poche. He is a former patient of Dr. Ronnald Nian. He has chronic systolic dysfunction with an EF of 45 to 50%, prior AVR back in 1987, HTN and chronic atrial fib. He remains on chronic coumadin therapy. He is followed by Dr. Windle Guard. He is intolerant to ACE inhibitors due to dizziness. He is on low dose beta blocker therapy.   He was last seen in December of 2012.   He comes in today. He is here with his wife. He remains hard of hearing. He is doing ok. No chest pain. Breathing is stable. BP at home is good. He remains active. Still driving. Not lightheaded or dizzy. No palpitations reported. No falls. Tolerating his medicines well. Usually only takes his lasix once a day and will occasionally need a second. His coumadin and regular labs are drawn by his PCP.  Current Outpatient Prescriptions on File Prior to Visit  Medication Sig Dispense Refill  . carvedilol (COREG) 6.25 MG tablet TAKE ONE TABLET BY MOUTH TWICE DAILY  60 tablet  3  . diltiazem (CARDIZEM CD) 120 MG 24 hr capsule Take 120 mg by mouth daily.        [name] KLOR-CON M20 20 MEQ tablet TAKE ONE TABLET BY MOUTH TWICE DAILY  60 each  6  . simvastatin (ZOCOR) 10 MG tablet Take 1 tablet (10 mg total) by mouth at bedtime.  30 tablet  3  . terazosin (HYTRIN) 5 MG capsule Take 5 mg by mouth at bedtime.        [name] warfarin (COUMADIN) 5 MG tablet Take 5 mg by mouth as directed.         No current facility-administered medications on file prior to visit.    Allergies  Allergen Reactions  . Ace Inhibitors     Dizziness   . Codeine   . Sulfa Drugs Cross Reactors     Past Medical History  Diagnosis Date  . Chronic atrial fibrillation   . Chronic anticoagulation     followed by Dr. Jeannetta Nap  . BPH (benign prostatic hypertrophy)   . Hypertension   . History of  kidney stones   . Systolic heart failure     EF 45 to 50% per echo in 2011 following exacerbation  . BPH (benign prostatic hyperplasia)   . S/P AVR 1987    Dr. Andrey Campanile    Past Surgical History  Procedure Laterality Date  . Aortic valve replacement  1987    #21 MM ST JUDE PLACED BY DR.CHARLES WILSON  . Cataract extraction      History  Smoking status  . Former Smoker  . Quit date: 01/10/1971  Smokeless tobacco  . Not on file    History  Alcohol Use No    Family History  Problem Relation Age of Onset  . Cancer Mother   . Heart disease Father   . Asthma Father   . Stroke Father     Review of Systems: The review of systems is per the HPI.  All other systems were reviewed and are negative.  Physical Exam: BP 162/78  Pulse 88  Ht 5\' 9"  (1.753 m)  Wt 169 lb 12.8 oz (77.021 kg)  BMI 25.06 kg/m2 Weight is down 3 pounds since his last visit here in 2012.  Patient is very pleasant and in  no acute distress. He is hard of hearing. Skin is warm and dry. Color is normal.  HEENT is unremarkable. Normocephalic/atraumatic. PERRL. Sclera are nonicteric. Neck is supple. No masses. No JVD. Lungs are clear. Cardiac exam shows an irregular rhythm. Valve is crisp.  Rate is ok. Abdomen is soft. Extremities are without edema. Gait and ROM are intact. No gross neurologic deficits noted.   LABORATORY DATA: Lab Results  Component Value Date   WBC 9.1 11/08/2009   HGB 10.2* 11/08/2009   HCT 33.9* 11/08/2009   PLT 186 11/08/2009   GLUCOSE 106* 12/22/2010   CHOL 146 12/22/2010   TRIG 67.0 12/22/2010   HDL 37.10* 12/22/2010   LDLCALC 96 12/22/2010   ALT 16 12/22/2010   AST 17 12/22/2010   NA 140 12/22/2010   K 4.2 12/22/2010   CL 103 12/22/2010   CREATININE 1.3 12/22/2010   BUN 29* 12/22/2010   CO2 30 12/22/2010   TSH 1.752 11/08/2009   INR 2.94* 11/12/2009     Assessment / Plan: 1. Systolic heart failure - doing well. Looks pretty well compensated. No change in his medicines.     2. HTN - has better BP control at home.  3. S/p AVR - on coumadin.   4. Chronic atrial fib - rate is ok. Remains on coumadin.  5. Advanced age  Overall he is doing pretty well and holding his own. I have left him on his current regimen. I will see him in 6 months. Lasix was refilled today.   Patient is agreeable to this plan and will call if any problems develop in the interim.

## 2012-03-07 DIAGNOSIS — Z23 Encounter for immunization: Secondary | ICD-10-CM | POA: Diagnosis not present

## 2012-03-07 DIAGNOSIS — M109 Gout, unspecified: Secondary | ICD-10-CM | POA: Diagnosis not present

## 2012-03-07 DIAGNOSIS — E785 Hyperlipidemia, unspecified: Secondary | ICD-10-CM | POA: Diagnosis not present

## 2012-03-29 ENCOUNTER — Emergency Department (HOSPITAL_COMMUNITY)
Admission: EM | Admit: 2012-03-29 | Discharge: 2012-03-29 | Disposition: A | Discharge status: Home / Self Care | Payer: Medicare Other | Attending: Emergency Medicine | Admitting: Emergency Medicine

## 2012-03-29 ENCOUNTER — Emergency Department (HOSPITAL_COMMUNITY): Payer: Medicare Other

## 2012-03-29 ENCOUNTER — Encounter (HOSPITAL_COMMUNITY): Payer: Self-pay | Admitting: Adult Health

## 2012-03-29 DIAGNOSIS — I2510 Atherosclerotic heart disease of native coronary artery without angina pectoris: Secondary | ICD-10-CM | POA: Diagnosis not present

## 2012-03-29 DIAGNOSIS — Z954 Presence of other heart-valve replacement: Secondary | ICD-10-CM | POA: Insufficient documentation

## 2012-03-29 DIAGNOSIS — Z862 Personal history of diseases of the blood and blood-forming organs and certain disorders involving the immune mechanism: Secondary | ICD-10-CM | POA: Insufficient documentation

## 2012-03-29 DIAGNOSIS — Z7901 Long term (current) use of anticoagulants: Secondary | ICD-10-CM | POA: Insufficient documentation

## 2012-03-29 DIAGNOSIS — Z79899 Other long term (current) drug therapy: Secondary | ICD-10-CM | POA: Insufficient documentation

## 2012-03-29 DIAGNOSIS — Z87448 Personal history of other diseases of urinary system: Secondary | ICD-10-CM | POA: Insufficient documentation

## 2012-03-29 DIAGNOSIS — Z8679 Personal history of other diseases of the circulatory system: Secondary | ICD-10-CM | POA: Insufficient documentation

## 2012-03-29 DIAGNOSIS — R0602 Shortness of breath: Secondary | ICD-10-CM | POA: Diagnosis not present

## 2012-03-29 DIAGNOSIS — R569 Unspecified convulsions: Secondary | ICD-10-CM | POA: Insufficient documentation

## 2012-03-29 DIAGNOSIS — I5020 Unspecified systolic (congestive) heart failure: Secondary | ICD-10-CM | POA: Diagnosis not present

## 2012-03-29 DIAGNOSIS — R000 Tachycardia, unspecified: Secondary | ICD-10-CM | POA: Diagnosis not present

## 2012-03-29 DIAGNOSIS — I10 Essential (primary) hypertension: Secondary | ICD-10-CM | POA: Diagnosis not present

## 2012-03-29 DIAGNOSIS — R55 Syncope and collapse: Secondary | ICD-10-CM | POA: Diagnosis not present

## 2012-03-29 DIAGNOSIS — I4891 Unspecified atrial fibrillation: Secondary | ICD-10-CM | POA: Diagnosis not present

## 2012-03-29 DIAGNOSIS — R51 Headache: Secondary | ICD-10-CM | POA: Diagnosis not present

## 2012-03-29 DIAGNOSIS — R413 Other amnesia: Secondary | ICD-10-CM | POA: Insufficient documentation

## 2012-03-29 DIAGNOSIS — Z951 Presence of aortocoronary bypass graft: Secondary | ICD-10-CM | POA: Insufficient documentation

## 2012-03-29 DIAGNOSIS — Z87442 Personal history of urinary calculi: Secondary | ICD-10-CM | POA: Diagnosis not present

## 2012-03-29 DIAGNOSIS — Z87891 Personal history of nicotine dependence: Secondary | ICD-10-CM | POA: Insufficient documentation

## 2012-03-29 LAB — BASIC METABOLIC PANEL
Calcium: 9.1 mg/dL (ref 8.4–10.5)
GFR calc Af Amer: 50 mL/min — ABNORMAL LOW (ref >90)
GFR calc non Af Amer: 43 mL/min — ABNORMAL LOW (ref >90)
Sodium: 139 mEq/L (ref 135–145)

## 2012-03-29 LAB — PROTIME-INR
INR: 2.54 — ABNORMAL HIGH (ref 0.00–1.49)
Prothrombin Time: 26.1 seconds — ABNORMAL HIGH (ref 11.6–15.2)

## 2012-03-29 LAB — CBC
HCT: 39.6 % (ref 39.0–52.0)
MCHC: 32.3 g/dL (ref 30.0–36.0)
RDW: 14.7 % (ref 11.5–15.5)

## 2012-03-29 MED ORDER — LEVETIRACETAM 1000 MG PO TABS: 500.0000 mg | ORAL_TABLET | Freq: Two times a day (BID) | ORAL | Status: DC

## 2012-03-29 MED ORDER — LEVETIRACETAM 500 MG PO TABS
1000.0000 mg | ORAL_TABLET | Freq: Once | ORAL | Status: AC
  Administered 2012-03-29: 1000 mg via ORAL
  Filled 2012-03-29: qty 2

## 2012-03-29 NOTE — Consult Note (Signed)
Reason for Consult: possible seizure Referring Physician: Chaney Malling  CC: episode of unresponsiveness  History is obtained from:Patient, daughter  HPI: Eric Wheeler is a 79 y.o. male with a history of afib and mechanical AVR(in 1987) who has had 6 - 7 episodes in total. The episode today was typical and consisted of a change in respiration followed by a blank stare without eye deviation. He smacked his lips during this period. After approximately 1 minute, this blank stare passed and he had micturation urgency. He was confused and got lost in his own house and urinated in an inappropriate place. He returned to his normal self over 5 - 10 minutes. He is currently completely back to baseline.   He has had two episodes causing him to fall, but the others have been identical to the one today including lip smacking and post-ictal confusion.   He describes the event as "lasting only a couple of seconds" and jsut "I don't breath" but can't give a reason why not and feels he could if he tried during the event. From his description I feel he is amnestic to the events.   He does have some mild memory problems.   ROS: A 14 point ROS was performed and is negative except as noted in the HPI.  Past Medical History  Diagnosis Date  . Chronic atrial fibrillation   . Chronic anticoagulation     followed by Dr. Jeannetta Nap  . BPH (benign prostatic hypertrophy)   . Hypertension   . History of kidney stones   . Systolic heart failure     EF 45 to 50% per echo in 2011 following exacerbation  . BPH (benign prostatic hyperplasia)   . S/P AVR 1987    Dr. Andrey Campanile    Family History: No hx sz or dementia.   Social History: Tob: remote hx of smoking  Exam: Current vital signs: BP 133/78  Pulse 93  Temp(Src) 98 F (36.7 C) (Oral)  Resp 16  SpO2 94% Vital signs in last 24 hours: Temp:  [97.7 F (36.5 C)-98 F (36.7 C)] 98 F (36.7 C) (03/21 1650) Pulse Rate:  [41-111] 93 (03/21 1926) Resp:   [15-25] 16 (03/21 1926) BP: (126-155)/(56-136) 133/78 mmHg (03/21 1926) SpO2:  [94 %-100 %] 94 % (03/21 1926)  General: in bed, NAD CV: RRR Mental Status: Patient is awake, alert, oriented to person, place, month, year, and situation. Able to spell world backwards without difficulty # of quarters in $2.75 is given as 6, then corrected with prompting to 7.  No signs of aphasia or neglect.  Cranial Nerves: II: Visual Fields are full. Pupils are equal, round, and reactive to light.  Discs are difficult to visualize. III,IV, VI: EOMI without ptosis or diploplia.  V: Facial sensation is symmetric to temperature VII: Facial movement is symmetric.  VIII: hearing is intact to voice X: Uvula elevates symmetrically XI: Shoulder shrug is symmetric. XII: tongue is midline without atrophy or fasciculations.  Motor: Tone is normal. Bulk is normal. 5/5 strength was present in all four extremities.  Sensory: Sensation is symmetric to light touch and temperature in the arms and legs. Deep Tendon Reflexes: 2+ and symmetric in the biceps and patellae.  Cerebellar: FNF  With mild tremor bilaterally Gait: Patient has a stable casual gait.   I have reviewed labs in epic and the results pertinent to this consultation are: BMP mildly elevated Cr.   I have reviewed the images obtained:CT head - remote cerebellar infarct. No  acute findings.   Impression: 79 yo M with at least 6 -7 events over the past few months that are sterotypical and consistent with seizure. I feel that given the description, I would start an AED even if his EEG is normal and therefore do not feel strongly about admitting him solely to obtain and EEG. Given the duration that these events have been happening, I also do not feel strongly about urgent MRI given his negative CT.   With him back to baseline, I feel that he could be started on keppra with outpatient follow up.   Recommendations: 1) Start keppra 500mg  BID 2) F/U as  outpatient with neurology. Could consider EEG/MRI at that time.  3) I advised him in no uncertain terms that he is not allowed to drive for 6 months. I advised him that by  law, anyone with a seizure is not allowed to drive for this period. I expressed this to the family present as well.    Ritta Slot, MD [facility] [phone]  If 7pm- 7am, please page neurology on call at [phone].

## 2012-03-29 NOTE — ED Notes (Signed)
Pt placed on monitor.  

## 2012-03-29 NOTE — ED Notes (Signed)
Pt presents to ED due to near syncopal episode that happened earlier today.  Per family pt was sitting up and became aphasic and SOB lasting about 2 minutes.  Pt also was confused during episode and doesn't remember it happening.  Pt denies any complaints at present.  A-fib on monitor- hx of a-fib, pt on cardiac monitor.  Will continue to monitor pt.

## 2012-03-29 NOTE — ED Notes (Signed)
Presents with an episode of near syncope, per family, "he became very SOB, eyes fixed and he could not speak even though he was trying to speak and very disoriented after, lasted about 3-4 minutes" Event occurred at 15: 50 today.  pt is alert and oriented at this time, no drift, no droop, no slurred speech. Answering all questions appropriately. Pt denies pain. Takes warfarin.

## 2012-03-29 NOTE — ED Notes (Signed)
AIDET performed. 

## 2012-03-29 NOTE — ED Provider Notes (Signed)
I have supervised the resident on the management of this patient and agree with the note above. I personally interviewed and examined the patient and my addendum is below.   Eric Wheeler is a 79 y.o. male hx of CAD s/p CABG, afib on coumadin here with AMS. He was visiting his wife in the hospital and was sitting and suddenly was starring at space and had lip smacking. He was a little confused afterwards. No urinary incontinence. Had similar episode several months ago with urinary incontinence. Felt fine out. No facial droop or weakness. Vitals showed HR 90-110 in atrial fibrillation otherwise stable. EKG showed afib that is chronic. Nonfocal neuro exam. Will get CT head to r/o stroke vs bleed. Will get labs. Likely seizure vs syncope.   Neuro evaluated, felt it was seizure and not syncope. Patient given keppra and will d/c home on keppra. CT head unremarkable. Labs showed slightly elevated BNP but CXR showed no heart failure.    Richardean Canal, MD 03/30/12 [phone]

## 2012-03-29 NOTE — ED Provider Notes (Signed)
History     CSN: [id number]  Arrival date & time 03/29/12  1550   First MD Initiated Contact with Patient 03/29/12 1656      Chief Complaint  Patient presents with  . Near Syncope    (Consider location/radiation/quality/duration/timing/severity/associated sxs/prior treatment) HPI Comments: 79 yo male presenting with chief complaint of near syncope.  Family reports that they were sitting and eating when the patient's eyes glazed over and he stopped breathing and stopped responding to questions.  Daughter reports that he was acting like he was trying to talk/mouthing words.  He did not fall over or slump in his chair.  Episode lasted 3-4 minutes before he regained full level of consciousness.  He was then confused for some time and appeared disoriented.  Daughter reports that this occurred in pt's wife's hospital room, and after the incident, pt got up to use the bathroom, but went into a neighboring patient's room instead.    Pt himself reports feeling well now.  No chest pain, SOB, nausea, vomiting, or dizziness.  He denies these symptoms at the time of the incident.     Past Medical History  Diagnosis Date  . Chronic atrial fibrillation   . Chronic anticoagulation     followed by Dr. Jeannetta Nap  . BPH (benign prostatic hypertrophy)   . Hypertension   . History of kidney stones   . Systolic heart failure     EF 45 to 50% per echo in 2011 following exacerbation  . BPH (benign prostatic hyperplasia)   . S/P AVR 1987    Dr. Andrey Campanile    Past Surgical History  Procedure Laterality Date  . Aortic valve replacement  1987    #21 MM ST JUDE PLACED BY DR.CHARLES WILSON  . Cataract extraction      Family History  Problem Relation Age of Onset  . Cancer Mother   . Heart disease Father   . Asthma Father   . Stroke Father     History  Substance Use Topics  . Smoking status: Former Smoker    Quit date: 01/10/1971  . Smokeless tobacco: Not on file  . Alcohol Use: No      Review  of Systems  Constitutional: Negative for fever.  HENT: Negative for congestion, facial swelling and trouble swallowing.   Respiratory: Negative for cough and shortness of breath.   Cardiovascular: Negative for chest pain.  Gastrointestinal: Positive for diarrhea (several days ago). Negative for nausea, vomiting and abdominal pain.  Genitourinary: Negative for difficulty urinating.  Skin: Negative for rash.  All other systems reviewed and are negative.    Allergies  Ace inhibitors; Codeine; and Sulfa drugs cross reactors  Home Medications   Current Outpatient Rx  Name  Route  Sig  Dispense  Refill  . carvedilol (COREG) 6.25 MG tablet      TAKE ONE TABLET BY MOUTH TWICE DAILY   60 tablet   3   . diltiazem (CARDIZEM CD) 120 MG 24 hr capsule   Oral   Take 120 mg by mouth daily.           . furosemide (LASIX) 40 MG tablet   Oral   Take 1 tablet (40 mg total) by mouth 2 (two) times daily.   60 tablet   11   . KLOR-CON M20 20 MEQ tablet      TAKE ONE TABLET BY MOUTH TWICE DAILY   60 each   6   . simvastatin (ZOCOR) 10 MG tablet  Oral   Take 1 tablet (10 mg total) by mouth at bedtime.   30 tablet   3   . terazosin (HYTRIN) 5 MG capsule   Oral   Take 5 mg by mouth at bedtime.           [name] warfarin (COUMADIN) 5 MG tablet   Oral   Take 5 mg by mouth as directed.             BP 141/76  Pulse 103  Temp(Src) 98 F (36.7 C) (Oral)  Resp 15  SpO2 100%  Physical Exam  Nursing note and vitals reviewed. Constitutional: He is oriented to person, place, and time. He appears well-developed and well-nourished. No distress.  HENT:  Head: Normocephalic and atraumatic.  Mouth/Throat: Oropharynx is clear and moist.  Eyes: Conjunctivae are normal. Pupils are equal, round, and reactive to light. No scleral icterus.  Neck: Normal range of motion. Neck supple.  Cardiovascular: Normal heart sounds and intact distal pulses.  An irregularly irregular rhythm present.  Tachycardia present.   No murmur heard. Pulmonary/Chest: Effort normal and breath sounds normal. No stridor. No respiratory distress. He has no wheezes. He has no rales.  Median sternotomy scar.    Abdominal: Soft. He exhibits no distension. There is no tenderness. There is no rebound and no guarding.  Musculoskeletal: Normal range of motion. He exhibits edema (trace BLE). He exhibits no tenderness.  Neurological: He is alert and oriented to person, place, and time.  Skin: Skin is warm and dry. No rash noted.  Psychiatric: He has a normal mood and affect. His behavior is normal.    ED Course  Procedures (including critical care time)  Labs Reviewed  CBC - Abnormal; Notable for the following:    Hemoglobin 12.8 (*)    All other components within normal limits  BASIC METABOLIC PANEL - Abnormal; Notable for the following:    CO2 33 (*)    Glucose, Bld 115 (*)    BUN 32 (*)    Creatinine, Ser 1.39 (*)    GFR calc non Af Amer 43 (*)    GFR calc Af Amer 50 (*)    All other components within normal limits  PROTIME-INR - Abnormal; Notable for the following:    Prothrombin Time 26.1 (*)    INR 2.54 (*)    All other components within normal limits  PRO B NATRIURETIC PEPTIDE - Abnormal; Notable for the following:    Pro B Natriuretic peptide (BNP) 2069.0 (*)    All other components within normal limits  POCT I-STAT TROPONIN I   Dg Chest 2 View  03/29/2012  *RADIOLOGY REPORT*  Clinical Data: Shortness of breath.  CHEST - 2 VIEW  Comparison: 11/08/2009 chest radiograph  Findings: Cardiomegaly and median sternotomy wires again noted.  There is no evidence of focal airspace disease, pulmonary edema, suspicious pulmonary nodule/mass, pleural effusion, or pneumothorax. No acute bony abnormalities are identified.  IMPRESSION: Cardiomegaly without evidence of acute cardiopulmonary disease.   Original Report Authenticated By: Harmon Pier, M.D.    Ct Head Wo Contrast  03/29/2012  *RADIOLOGY REPORT*   Clinical Data: 79 year old male with altered mental status and near- syncope.  CT HEAD WITHOUT CONTRAST  Technique:  Contiguous axial images were obtained from the base of the skull through the vertex without contrast.  Comparison: None  Findings: Mild chronic small vessel white matter ischemic changes and a remote right cerebellar infarct are noted. No acute intracranial abnormalities are identified, including mass  lesion or mass effect, hydrocephalus, extra-axial fluid collection, midline shift, hemorrhage, or acute infarction.  The visualized bony calvarium is unremarkable. Mucosal thickening within the left maxillary and ethmoid sinuses noted.  IMPRESSION: No evidence of acute intracranial abnormality.  Mild chronic small vessel white matter ischemic changes and remote right cerebellar infarct.  Left paranasal sinus disease/sinusitis.   Original Report Authenticated By: Harmon Pier, M.D.   All radiology studies independently viewed by me.      Date: 03/29/2012  Rate: 93  Rhythm: atrial fibrillation and premature ventricular contractions (PVC)  QRS Axis: left  Intervals: normal  ST/T Wave abnormalities: normal  Conduction Disutrbances:none  Narrative Interpretation:   Old EKG Reviewed: none available   1. Seizure       MDM   79 yo male with presyncope vs partial seizure.  Well appearing now.  Strong heart history, no neuro history.  Plan labs, head CT, CXR.  Will also have neurology evaluate.    6:26 PM Neurology evaluated and felt that symptoms are typical of seizure.  They recommend PO Keppra here and dc on keppra.  Labs unremarkable except for elevated BNP.  Unsure what his current baseline is (last value several years ago).  He denies SOB and CXR is clear.  Plan dc home with keppra and neurology follow up.       Rennis Petty, MD 03/29/12 [id number]

## 2012-04-01 ENCOUNTER — Other Ambulatory Visit: Payer: Self-pay | Admitting: Cardiology

## 2012-04-02 NOTE — Telephone Encounter (Signed)
..   Requested Prescriptions   Pending Prescriptions Disp Refills  . carvedilol (COREG) 6.25 MG tablet [Pharmacy Med Name: CARVEDILOL 6.25MG    TAB] 60 tablet 6    Sig: TAKE ONE TABLET BY MOUTH TWICE DAILY

## 2012-04-09 ENCOUNTER — Encounter: Payer: Self-pay | Admitting: Neurology

## 2012-04-09 ENCOUNTER — Ambulatory Visit (INDEPENDENT_AMBULATORY_CARE_PROVIDER_SITE_OTHER): Payer: Medicare Other | Admitting: Neurology

## 2012-04-09 VITALS — BP 132/69 | HR 101 | Ht 68.75 in | Wt 176.0 lb

## 2012-04-09 DIAGNOSIS — R55 Syncope and collapse: Secondary | ICD-10-CM

## 2012-04-09 NOTE — Patient Instructions (Signed)
   We will check an EEG and a heart monitor.

## 2012-04-09 NOTE — Progress Notes (Signed)
Reason for visit: Syncope  Eric Wheeler is a 79 y.o. male  History of present illness:  Eric Wheeler is a 79 year old right-handed white male with a history of atrial fibrillation, and an aortic valve replacement. The patient indicates that since his heart valve surgery that was done in 1987, he has had episodes of "stopping breathing". The events may occur once every several months. The patient has had 2 recent syncopal events associated with cessation of breathing. The first event occurred about 2 months ago, and this occurred while sitting in a chair at home. The patient was noted to stop breathing before the event. The patient lost consciousness, and he slumped over and fell over on the floor. There was no stiffening or jerking. The patient did not bite his tongue or lose control of the bowels or the bladder. The patient had a second episode on 03/29/2012. The patient was at a hospital visiting someone else, and he had a similar event. The patient again did not jerk or twitch. The patient was noted to be blanched in the face just prior to the black out. The patient may have some sensation of nausea or queasiness. The patient reports no focal numbness or weakness of the face, arms, or legs. The patient had a CT scan of the brain that was relatively unremarkable, without acute changes. No significant small vessel ischemic changes were noted. The patient is sent to this office. He was placed on Keppra taking 500 mg twice daily. The patient was operating a motor vehicle, but he has not done this since his second syncopal event.  Past Medical History  Diagnosis Date  . Chronic atrial fibrillation   . Chronic anticoagulation     followed by Dr. Jeannetta Nap  . BPH (benign prostatic hypertrophy)   . Hypertension   . History of kidney stones   . Systolic heart failure     EF 45 to 50% per echo in 2011 following exacerbation  . BPH (benign prostatic hyperplasia)   . S/P AVR 1987    Dr. Andrey Campanile     Past Surgical History  Procedure Laterality Date  . Aortic valve replacement  1987    #21 MM ST JUDE PLACED BY DR.CHARLES WILSON  . Cataract extraction      Family History  Problem Relation Age of Onset  . Cancer Mother   . Heart disease Father   . Asthma Father   . Stroke Father   . Dementia Sister   . Heart disease Brother     Social history:  reports that he quit smoking about 41 years ago. He does not have any smokeless tobacco history on file. He reports that he does not drink alcohol or use illicit drugs.  Medications:  Current Outpatient Prescriptions on File Prior to Visit  Medication Sig Dispense Refill  . carvedilol (COREG) 6.25 MG tablet Take 6.25 mg by mouth 2 (two) times daily with a meal.      . carvedilol (COREG) 6.25 MG tablet TAKE ONE TABLET BY MOUTH TWICE DAILY  60 tablet  6  . diltiazem (CARDIZEM CD) 120 MG 24 hr capsule Take 120 mg by mouth daily.        . furosemide (LASIX) 40 MG tablet Take 1 tablet (40 mg total) by mouth 2 (two) times daily.  60 tablet  11  . levETIRAcetam (KEPPRA) 1000 MG tablet Take 0.5 tablets (500 mg total) by mouth 2 (two) times daily.  60 tablet  1  . potassium  chloride SA (K-DUR,KLOR-CON) 20 MEQ tablet Take 20 mEq by mouth 2 (two) times daily.      . simvastatin (ZOCOR) 10 MG tablet Take 1 tablet (10 mg total) by mouth at bedtime.  30 tablet  3  . terazosin (HYTRIN) 5 MG capsule Take 5 mg by mouth at bedtime.        [name] warfarin (COUMADIN) 5 MG tablet Take 2.5-5 mg by mouth See admin instructions. Takes 0.5 (2.5mg ) on Saturday and Sunday. Takes 1 tablet (5mg ) all other days.       No current facility-administered medications on file prior to visit.    Allergies:  Allergies  Allergen Reactions  . Ace Inhibitors     Dizziness   . Codeine   . Sulfa Drugs Cross Reactors     ROS:  Out of a complete 14 system review of symptoms, the patient complains only of the following symptoms, and all other reviewed systems are  negative.  Swelling in the legs Short of breath Easy bruising Feeling cold Blackout Decreased energy  Blood pressure 132/69, pulse 101, height 5' 8.75" (1.746 m), weight 176 lb (79.833 kg).  Physical Exam  General: The patient is alert and cooperative at the time of the examination.  Head: Pupils are equal, round, and reactive to light. Discs are flat bilaterally.  Neck: The neck is supple, no carotid bruits are noted.  Respiratory: The respiratory examination is clear.  Cardiovascular: The cardiovascular examination reveals an irregular rhythm, a mechanical valve click is noted in the aortic area.  Skin: Extremities are without significant edema.  Neurologic Exam  Mental status:  Cranial nerves: Facial symmetry is present. There is good sensation of the face to pinprick and soft touch bilaterally. The strength of the facial muscles and the muscles to head turning and shoulder shrug are normal bilaterally. Speech is well enunciated, no aphasia or dysarthria is noted. Extraocular movements are full. Visual fields are full.  Motor: The motor testing reveals 5 over 5 strength of all 4 extremities. Good symmetric motor tone is noted throughout.  Sensory: Sensory testing is intact to pinprick, soft touch, vibration sensation, and position sense on all 4 extremities. No evidence of extinction is noted.  Coordination: Cerebellar testing reveals good finger-nose-finger and heel-to-shin bilaterally.  Gait and station: Gait is slightly wide-based, normal for age. Tandem gait is unstable. Romberg is negative. No drift is seen  Reflexes: Deep tendon reflexes are symmetric and normal bilaterally. Toes are downgoing bilaterally.   Assessment/Plan:  1. Syncopal event  2. Aortic valve replacement, mechanical  3. Atrial fibrillation  The patient has had 2 events of loss of consciousness preceded by episodes where he stops breathing. The patient may have nausea associated with these  events, but no jerking or twitching or stiffening. These events may not be seizures. The patient will be set up for an EEG study, and he will have a CardioNet monitor for 3 weeks to ensure that a heart rhythm abnormality is not the etiology of his events. The patient will followup in 3 months. For now, the patient will remain on Keppra.  Marlan Palau MD 04/09/2012 12:12 PM  [facility] [address] [place], [place] [address]  Phone [phone] Fax [phone]

## 2012-04-15 ENCOUNTER — Ambulatory Visit (INDEPENDENT_AMBULATORY_CARE_PROVIDER_SITE_OTHER): Payer: Medicare Other | Admitting: Radiology

## 2012-04-15 ENCOUNTER — Telehealth: Payer: Self-pay | Admitting: Neurology

## 2012-04-15 DIAGNOSIS — R55 Syncope and collapse: Secondary | ICD-10-CM

## 2012-04-15 NOTE — Procedures (Signed)
History: Eric Wheeler is a 79 year old gentleman with a history of episodes of loss of consciousness that were preceded by episodes of respiratory arrest. The patient has a history of atrial fibrillation and an aortic valve replacement. The patient is being evaluated for possible seizures.   This is a routine EEG. No skull defects are noted. Medications include carvedilol, Proscar, Lasix, Keppra, Zocor, Hytrin, and Coumadin.   EEG classification: Normal awake  Description of the recording: The background rhythms of this recording consists of a fairly well modulated medium amplitude alpha rhythm of 8 Hz that is reactive to eye opening and closure. As the record progresses, the patient appears to remain in the waking state throughout the recording. Photic stimulation was performed, resulting in a bilateral and symmetric photic driving response. Hyperventilation was not performed. At no time during the recording does there appear to be evidence of spike or spike wave discharges or evidence of focal slowing. EKG monitor shows no evidence of cardiac rhythm abnormalities with a heart rate of 84.  Impression: This is a normal EEG recording in the waking state. No evidence of ictal or interictal discharges are seen.

## 2012-04-15 NOTE — Telephone Encounter (Signed)
I called patient. EEG study was unremarkable. The patient has been set up for a CardioNet monitor study. I will contact him once I get this result.

## 2012-04-17 ENCOUNTER — Telehealth: Payer: Self-pay | Admitting: *Deleted

## 2012-04-17 NOTE — Telephone Encounter (Signed)
Nigel Sloop called stating since the EEG was normal, should the patient continue taking Keppra.

## 2012-04-17 NOTE — Telephone Encounter (Signed)
I called the patient. He is to stay on the Keppra for now. We will get the results from the CardioNet monitor. We will make a decision concerning the Keppra at that time.

## 2012-04-22 ENCOUNTER — Ambulatory Visit (INDEPENDENT_AMBULATORY_CARE_PROVIDER_SITE_OTHER): Payer: Medicare Other

## 2012-04-22 DIAGNOSIS — R55 Syncope and collapse: Secondary | ICD-10-CM

## 2012-04-22 NOTE — Progress Notes (Signed)
Place a event monitor on patient and went over the instructions on how to use it and when to return it

## 2012-04-29 ENCOUNTER — Telehealth: Payer: Self-pay | Admitting: Physician Assistant

## 2012-04-29 NOTE — Telephone Encounter (Signed)
Rec'd a call from [employer] that Mr Eric Wheeler's HR had gone > 180 for a minute or more. They could not reach Mr Steen. Called the number listed and spoke to his son-in-law, Mr Shela Nevin. Mr Eric Wheeler is doing fine, he was weed-eating this pm and fell but it was a mechanical fall, no significant injuries. Pt is not having any other problems at this time.

## 2012-05-08 DIAGNOSIS — N138 Other obstructive and reflux uropathy: Secondary | ICD-10-CM | POA: Diagnosis not present

## 2012-05-08 DIAGNOSIS — N403 Nodular prostate with lower urinary tract symptoms: Secondary | ICD-10-CM | POA: Diagnosis not present

## 2012-05-30 DIAGNOSIS — I359 Nonrheumatic aortic valve disorder, unspecified: Secondary | ICD-10-CM | POA: Diagnosis not present

## 2012-06-04 ENCOUNTER — Telehealth: Payer: Self-pay

## 2012-06-04 NOTE — Telephone Encounter (Signed)
TALKED TO HIS SON IN LAW ABOUT HIS MONITOR RESULTS

## 2012-06-10 ENCOUNTER — Other Ambulatory Visit: Payer: Self-pay | Admitting: Nurse Practitioner

## 2012-07-09 ENCOUNTER — Telehealth: Payer: Self-pay | Admitting: Nurse Practitioner

## 2012-07-09 NOTE — Telephone Encounter (Signed)
New Problem:    Called in wanting to have her father begin getting his INR checks in our office.  Please call back.

## 2012-07-09 NOTE — Telephone Encounter (Signed)
Gave to Tiffany in coumadin clinic she made an appointment for Pt to start getting INR's checked here

## 2012-07-09 NOTE — Telephone Encounter (Signed)
Ok to do here. Find out when he needs his next check.

## 2012-08-29 ENCOUNTER — Ambulatory Visit: Payer: Medicare Other | Admitting: Neurology

## 2012-08-30 ENCOUNTER — Encounter: Payer: Self-pay | Admitting: Nurse Practitioner

## 2012-08-30 ENCOUNTER — Ambulatory Visit (INDEPENDENT_AMBULATORY_CARE_PROVIDER_SITE_OTHER): Payer: Medicare Other | Admitting: Nurse Practitioner

## 2012-08-30 ENCOUNTER — Ambulatory Visit (INDEPENDENT_AMBULATORY_CARE_PROVIDER_SITE_OTHER): Payer: Medicare Other | Admitting: *Deleted

## 2012-08-30 VITALS — BP 120/62 | HR 64 | Ht 69.0 in | Wt 167.0 lb

## 2012-08-30 DIAGNOSIS — Z952 Presence of prosthetic heart valve: Secondary | ICD-10-CM

## 2012-08-30 DIAGNOSIS — I4891 Unspecified atrial fibrillation: Secondary | ICD-10-CM

## 2012-08-30 DIAGNOSIS — E785 Hyperlipidemia, unspecified: Secondary | ICD-10-CM

## 2012-08-30 DIAGNOSIS — Z7901 Long term (current) use of anticoagulants: Secondary | ICD-10-CM | POA: Diagnosis not present

## 2012-08-30 DIAGNOSIS — Z954 Presence of other heart-valve replacement: Secondary | ICD-10-CM | POA: Diagnosis not present

## 2012-08-30 DIAGNOSIS — R55 Syncope and collapse: Secondary | ICD-10-CM

## 2012-08-30 DIAGNOSIS — I482 Chronic atrial fibrillation, unspecified: Secondary | ICD-10-CM

## 2012-08-30 LAB — BASIC METABOLIC PANEL
BUN: 35 mg/dL — ABNORMAL HIGH (ref 6–23)
CO2: 29 mEq/L (ref 19–32)
Calcium: 8.7 mg/dL (ref 8.4–10.5)
Chloride: 102 mEq/L (ref 96–112)
Creatinine, Ser: 1.4 mg/dL (ref 0.4–1.5)
GFR: 52.67 mL/min — ABNORMAL LOW (ref >60.00)
Glucose, Bld: 99 mg/dL (ref 70–99)
Potassium: 4.1 mEq/L (ref 3.5–5.1)
Sodium: 139 mEq/L (ref 135–145)

## 2012-08-30 LAB — CBC WITH DIFFERENTIAL/PLATELET
Basophils Absolute: 0 10*3/uL (ref 0.0–0.1)
Basophils Relative: 0.5 % (ref 0.0–3.0)
Eosinophils Absolute: 0.3 10*3/uL (ref 0.0–0.7)
Eosinophils Relative: 4.4 % (ref 0.0–5.0)
HCT: 37.4 % — ABNORMAL LOW (ref 39.0–52.0)
Hemoglobin: 12.4 g/dL — ABNORMAL LOW (ref 13.0–17.0)
Lymphocytes Relative: 18.3 % (ref 12.0–46.0)
Lymphs Abs: 1.3 10*3/uL (ref 0.7–4.0)
MCHC: 33 g/dL (ref 30.0–36.0)
MCV: 89.4 fl (ref 78.0–100.0)
Monocytes Absolute: 0.9 10*3/uL (ref 0.1–1.0)
Monocytes Relative: 12.2 % — ABNORMAL HIGH (ref 3.0–12.0)
Neutro Abs: 4.7 10*3/uL (ref 1.4–7.7)
Neutrophils Relative %: 64.6 % (ref 43.0–77.0)
Platelets: 178 10*3/uL (ref 150.0–400.0)
RBC: 4.18 Mil/uL — ABNORMAL LOW (ref 4.22–5.81)
RDW: 15.3 % — ABNORMAL HIGH (ref 11.5–14.6)
WBC: 7.2 10*3/uL (ref 4.5–10.5)

## 2012-08-30 LAB — TSH: TSH: 1.38 u[IU]/mL (ref 0.35–5.50)

## 2012-08-30 LAB — POCT INR: INR: 2.7

## 2012-08-30 NOTE — Patient Instructions (Addendum)
For now, stay on your current medicines  I am going to check labs today  I will talk with one of the EP doctors (electrician doctors of the heart) about your spells and a possible different kind of heart monitor  I will call Eric Wheeler and tell him what they say.   Call the [facility] office at [phone] if you have any questions, problems or concerns.

## 2012-08-30 NOTE — Progress Notes (Signed)
Jenelle Mages Date of Birth: [birth year] Medical Record #[id number]  History of Present Illness: Mr. Age is seen back today for a 6 month check. Seen for Dr. Antoine Poche. Former patient of Dr. Ronnald Nian. Has had chronic systolic heart failure with an EF of 45 to 50%, prior AVR back in 1987, HTN and chronic atrial fib. Remains on chronic coumadin. Intolerant to ACE due to dizziness. Coumadin is now monitored thru our office.  Back in April he was seen by neuro - he told Dr. Anne Hahn the following - that ever since his heart valve surgery that was done in 1987, he had had episodes of "stopping breathing". The events may occur once every several months. The patient has had 2 recent syncopal events associated with cessation of breathing. The first event occurred about 2 months ago, and this occurred while sitting in a chair at home. The patient was noted to stop breathing before the event. The patient lost consciousness, and he slumped over and fell over on the floor. There was no stiffening or jerking. The patient did not bite his tongue or lose control of the bowels or the bladder. The patient had a second episode on 03/29/2012. The patient was at a hospital visiting someone else, and he had a similar event. The patient again did not jerk or twitch. The patient was noted to be blanched in the face just prior to the black out. The patient may have some sensation of nausea or queasiness. The patient reports no focal numbness or weakness of the face, arms, or legs. The patient had a CT scan of the brain that was relatively unremarkable, without acute changes. No significant small vessel ischemic changes were noted. The patient is sent to this office. He was placed on Keppra taking 500 mg twice daily. The patient was operating a motor vehicle, but he has not done this since his second syncopal event.  He has since had an EEG and has worn an event monitor - unfortunately, his spell happened after coming out of  the shower and was not "captured". Not felt to be seizures and his Keppra was stopped.   Comes in today. Here with his son in law today. He is now 79 years old. Seems to be slowing down. Has stopped driving. No chest pain. Continues to have these spells where he "stops breathing" - had a spell 2 weeks ago. Probably having more frequently now but still less than once a month. He tells me that he is not passing out but the description of one of the spells that was witnessed, he had one eye fixed, mouth open and guppy breathing. Notes that he will feel his heart beating fast. Describes a "weakness that comes over him with exertion". Having to rest more when he does exert himself and has gotten less active due to these spells - only happens maybe one a month and unfortunately the cardiac monitor missed it because he had just gotten out of the shower. No significant injury yet.Dr. Anne Hahn told them that he really thinks this is cardiac. Now checking his coumadin here because his wife has to have hers checked and it is done here.    Current Outpatient Prescriptions  Medication Sig Dispense Refill  . carvedilol (COREG) 6.25 MG tablet Take 6.25 mg by mouth 2 (two) times daily with a meal.      . finasteride (PROSCAR) 5 MG tablet Take 5 mg by mouth daily.      . furosemide (LASIX) 40  MG tablet Take 1 tablet (40 mg total) by mouth 2 (two) times daily.  60 tablet  11  . potassium chloride SA (K-DUR,KLOR-CON) 20 MEQ tablet Take 20 mEq by mouth 2 (two) times daily.      . simvastatin (ZOCOR) 10 MG tablet TAKE ONE TABLET BY MOUTH AT BEDTIME  30 tablet  9  . terazosin (HYTRIN) 5 MG capsule Take 5 mg by mouth at bedtime.        [name] warfarin (COUMADIN) 5 MG tablet Take 2.5-5 mg by mouth See admin instructions. Takes 0.5 (2.5mg ) on Saturday and Sunday. Takes 1 tablet (5mg ) all other days.       No current facility-administered medications for this visit.    Allergies  Allergen Reactions  . Ace Inhibitors      Dizziness   . Codeine   . Sulfa Drugs Cross Reactors     Past Medical History  Diagnosis Date  . Chronic atrial fibrillation   . Chronic anticoagulation     followed by Dr. Jeannetta Nap  . BPH (benign prostatic hypertrophy)   . Hypertension   . History of kidney stones   . Systolic heart failure     EF 45 to 50% per echo in 2011 following exacerbation  . BPH (benign prostatic hyperplasia)   . S/P AVR 1987    Dr. Andrey Campanile  . Gout   . Congestive heart failure   . Syncope     Past Surgical History  Procedure Laterality Date  . Aortic valve replacement  1987    #21 MM ST JUDE PLACED BY DR.CHARLES WILSON  . Cataract extraction      History  Smoking status  . Former Smoker  . Quit date: 01/10/1971  Smokeless tobacco  . Not on file    History  Alcohol Use No    Family History  Problem Relation Age of Onset  . Cancer Mother   . Heart disease Father   . Asthma Father   . Stroke Father   . Dementia Sister   . Heart disease Brother     Review of Systems: The review of systems is per the HPI.  All other systems were reviewed and are negative.  Physical Exam: BP 120/62  Pulse 64  Ht 5\' 9"  (1.753 m)  Wt 167 lb (75.751 kg)  BMI 24.65 kg/m2 Patient is very pleasant and in no acute distress. Skin is warm and dry. Color is normal.  HEENT is unremarkable. Normocephalic/atraumatic. PERRL. Sclera are nonicteric. Neck is supple. No masses. No JVD. Lungs are clear. Cardiac exam shows an irregular rhythm. Rate is ok. Valve sounds ok. Abdomen is soft. Extremities are without edema. Gait and ROM are intact. No gross neurologic deficits noted.  LABORATORY DATA: PENDING  Lab Results  Component Value Date   WBC 10.5 03/29/2012   HGB 12.8* 03/29/2012   HCT 39.6 03/29/2012   PLT 167 03/29/2012   GLUCOSE 115* 03/29/2012   CHOL 146 12/22/2010   TRIG 67.0 12/22/2010   HDL 37.10* 12/22/2010   LDLCALC 96 12/22/2010   ALT 16 12/22/2010   AST 17 12/22/2010   NA 139 03/29/2012   K 4.4  03/29/2012   CL 96 03/29/2012   CREATININE 1.39* 03/29/2012   BUN 32* 03/29/2012   CO2 33* 03/29/2012   TSH 1.752 11/08/2009   INR 2.7 08/30/2012    Assessment / Plan:  1. Chronic atrial fib - rate seems ok. Event monitor was basically ok - did have a spell with  increased HR - but this was after weed eating and then falling. This was not associated with any symptoms at that time.   2. Remote AVR - last echo in 2011 - don't really feel that we need to repeat yet.   3. HTN - blood pressure looks ok.   4. Syncope - not sure what to make of this. Family is quite concerned. Has already had an event monitor - may need a loop to further define - will talk with EP and then talk to Nigel Sloop (son in law) at [phone] or [phone].   5. Advanced age   Patient is agreeable to this plan and will call if any problems develop in the interim.   Rosalio Macadamia, RN, ANP-C [facility] [address] [place], [place]  [address]

## 2012-09-03 ENCOUNTER — Telehealth: Payer: Self-pay | Admitting: Nurse Practitioner

## 2012-09-03 NOTE — Telephone Encounter (Signed)
New problem    Pt was in office Friday and Lawson Fiscal told him about having sx and he want to tell Lawson Fiscal he doesn't want to have sx. Please inform her about this.

## 2012-09-03 NOTE — Telephone Encounter (Signed)
Eric Wheeler, Can you call hiim? Not sure what to make of this phone note. I was going to talk with EP when I get back about possible loop recorder - is this what he is talking about??  Shandrea Lusk

## 2012-09-03 NOTE — Telephone Encounter (Signed)
Routed to Norma Fredrickson, NP.

## 2012-09-10 NOTE — Telephone Encounter (Signed)
Spoke with patient's wife today - Melvern does not wish for any further testing/procedures. Will call if he has any problems. Also made aware that cholesterol levels did not get checked - will add to next INR.   Patient is agreeable to this plan and will call if any problems develop in the interim.   Rosalio Macadamia, RN, ANP-C [facility] [address] [place], [place]  [address]

## 2012-09-20 ENCOUNTER — Ambulatory Visit (INDEPENDENT_AMBULATORY_CARE_PROVIDER_SITE_OTHER): Payer: Medicare Other | Admitting: Nurse Practitioner

## 2012-09-20 ENCOUNTER — Ambulatory Visit (INDEPENDENT_AMBULATORY_CARE_PROVIDER_SITE_OTHER): Payer: Medicare Other | Admitting: *Deleted

## 2012-09-20 DIAGNOSIS — I4891 Unspecified atrial fibrillation: Secondary | ICD-10-CM

## 2012-09-20 DIAGNOSIS — E7800 Pure hypercholesterolemia, unspecified: Secondary | ICD-10-CM

## 2012-09-20 DIAGNOSIS — Z7901 Long term (current) use of anticoagulants: Secondary | ICD-10-CM | POA: Diagnosis not present

## 2012-09-20 DIAGNOSIS — Z954 Presence of other heart-valve replacement: Secondary | ICD-10-CM | POA: Diagnosis not present

## 2012-09-20 DIAGNOSIS — R55 Syncope and collapse: Secondary | ICD-10-CM | POA: Diagnosis not present

## 2012-09-20 DIAGNOSIS — I482 Chronic atrial fibrillation, unspecified: Secondary | ICD-10-CM

## 2012-09-20 DIAGNOSIS — Z952 Presence of prosthetic heart valve: Secondary | ICD-10-CM

## 2012-09-20 LAB — HEPATIC FUNCTION PANEL
ALT: 9 U/L (ref 0–53)
AST: 15 U/L (ref 0–37)
Albumin: 3.9 g/dL (ref 3.5–5.2)
Alkaline Phosphatase: 78 U/L (ref 39–117)
Bilirubin, Direct: 0.1 mg/dL (ref 0.0–0.3)
Total Bilirubin: 0.7 mg/dL (ref 0.3–1.2)
Total Protein: 7.3 g/dL (ref 6.0–8.3)

## 2012-09-20 LAB — LIPID PANEL
Cholesterol: 148 mg/dL (ref 0–200)
HDL: 33.5 mg/dL — ABNORMAL LOW (ref >39.00)
LDL Cholesterol: 95 mg/dL (ref 0–99)
Total CHOL/HDL Ratio: 4
Triglycerides: 97 mg/dL (ref 0.0–149.0)
VLDL: 19.4 mg/dL (ref 0.0–40.0)

## 2012-10-04 ENCOUNTER — Ambulatory Visit (INDEPENDENT_AMBULATORY_CARE_PROVIDER_SITE_OTHER): Payer: Medicare Other | Admitting: Pharmacist

## 2012-10-04 DIAGNOSIS — I4891 Unspecified atrial fibrillation: Secondary | ICD-10-CM

## 2012-10-04 DIAGNOSIS — Z954 Presence of other heart-valve replacement: Secondary | ICD-10-CM | POA: Diagnosis not present

## 2012-10-04 DIAGNOSIS — Z7901 Long term (current) use of anticoagulants: Secondary | ICD-10-CM | POA: Diagnosis not present

## 2012-10-04 DIAGNOSIS — Z952 Presence of prosthetic heart valve: Secondary | ICD-10-CM

## 2012-10-04 DIAGNOSIS — I482 Chronic atrial fibrillation, unspecified: Secondary | ICD-10-CM

## 2012-10-21 ENCOUNTER — Ambulatory Visit (INDEPENDENT_AMBULATORY_CARE_PROVIDER_SITE_OTHER): Payer: Medicare Other | Admitting: *Deleted

## 2012-10-21 DIAGNOSIS — Z7901 Long term (current) use of anticoagulants: Secondary | ICD-10-CM

## 2012-10-21 DIAGNOSIS — Z952 Presence of prosthetic heart valve: Secondary | ICD-10-CM

## 2012-10-21 DIAGNOSIS — I4891 Unspecified atrial fibrillation: Secondary | ICD-10-CM | POA: Diagnosis not present

## 2012-10-21 DIAGNOSIS — I482 Chronic atrial fibrillation, unspecified: Secondary | ICD-10-CM

## 2012-10-21 DIAGNOSIS — Z954 Presence of other heart-valve replacement: Secondary | ICD-10-CM | POA: Diagnosis not present

## 2012-10-21 LAB — POCT INR: INR: 3.4

## 2012-10-21 MED ORDER — WARFARIN SODIUM 5 MG PO TABS: ORAL_TABLET | ORAL | Status: DC

## 2012-11-04 DIAGNOSIS — N138 Other obstructive and reflux uropathy: Secondary | ICD-10-CM | POA: Diagnosis not present

## 2012-11-04 DIAGNOSIS — R3129 Other microscopic hematuria: Secondary | ICD-10-CM | POA: Diagnosis not present

## 2012-11-11 ENCOUNTER — Ambulatory Visit (INDEPENDENT_AMBULATORY_CARE_PROVIDER_SITE_OTHER): Payer: Medicare Other | Admitting: General Practice

## 2012-11-11 DIAGNOSIS — Z7901 Long term (current) use of anticoagulants: Secondary | ICD-10-CM | POA: Diagnosis not present

## 2012-11-11 DIAGNOSIS — I482 Chronic atrial fibrillation, unspecified: Secondary | ICD-10-CM

## 2012-11-11 DIAGNOSIS — I4891 Unspecified atrial fibrillation: Secondary | ICD-10-CM | POA: Diagnosis not present

## 2012-11-11 DIAGNOSIS — Z954 Presence of other heart-valve replacement: Secondary | ICD-10-CM | POA: Diagnosis not present

## 2012-11-11 DIAGNOSIS — Z952 Presence of prosthetic heart valve: Secondary | ICD-10-CM

## 2012-11-11 LAB — POCT INR: INR: 3.5

## 2012-11-14 ENCOUNTER — Other Ambulatory Visit: Payer: Self-pay

## 2012-11-27 ENCOUNTER — Other Ambulatory Visit: Payer: Self-pay | Admitting: Cardiology

## 2012-12-10 ENCOUNTER — Ambulatory Visit (INDEPENDENT_AMBULATORY_CARE_PROVIDER_SITE_OTHER): Payer: Medicare Other | Admitting: General Practice

## 2012-12-10 DIAGNOSIS — Z952 Presence of prosthetic heart valve: Secondary | ICD-10-CM

## 2012-12-10 DIAGNOSIS — Z7901 Long term (current) use of anticoagulants: Secondary | ICD-10-CM

## 2012-12-10 DIAGNOSIS — I4891 Unspecified atrial fibrillation: Secondary | ICD-10-CM | POA: Diagnosis not present

## 2012-12-10 DIAGNOSIS — I482 Chronic atrial fibrillation, unspecified: Secondary | ICD-10-CM

## 2012-12-10 DIAGNOSIS — Z954 Presence of other heart-valve replacement: Secondary | ICD-10-CM | POA: Diagnosis not present

## 2012-12-10 LAB — POCT INR: INR: 3.4

## 2012-12-25 DIAGNOSIS — R310 Gross hematuria: Secondary | ICD-10-CM | POA: Diagnosis not present

## 2012-12-25 DIAGNOSIS — N390 Urinary tract infection, site not specified: Secondary | ICD-10-CM | POA: Diagnosis not present

## 2012-12-31 DIAGNOSIS — R310 Gross hematuria: Secondary | ICD-10-CM | POA: Diagnosis not present

## 2012-12-31 DIAGNOSIS — R972 Elevated prostate specific antigen [PSA]: Secondary | ICD-10-CM | POA: Diagnosis not present

## 2012-12-31 DIAGNOSIS — R3129 Other microscopic hematuria: Secondary | ICD-10-CM | POA: Diagnosis not present

## 2013-01-07 ENCOUNTER — Other Ambulatory Visit: Payer: Self-pay | Admitting: Cardiology

## 2013-01-13 ENCOUNTER — Ambulatory Visit (INDEPENDENT_AMBULATORY_CARE_PROVIDER_SITE_OTHER): Payer: Medicare Other | Admitting: Pharmacist

## 2013-01-13 DIAGNOSIS — I4891 Unspecified atrial fibrillation: Secondary | ICD-10-CM

## 2013-01-13 DIAGNOSIS — Z5181 Encounter for therapeutic drug level monitoring: Secondary | ICD-10-CM | POA: Diagnosis not present

## 2013-01-13 DIAGNOSIS — Z954 Presence of other heart-valve replacement: Secondary | ICD-10-CM

## 2013-01-13 DIAGNOSIS — I482 Chronic atrial fibrillation, unspecified: Secondary | ICD-10-CM

## 2013-01-13 DIAGNOSIS — Z7901 Long term (current) use of anticoagulants: Secondary | ICD-10-CM

## 2013-01-13 DIAGNOSIS — Z952 Presence of prosthetic heart valve: Secondary | ICD-10-CM

## 2013-01-13 LAB — POCT INR: INR: 2.7

## 2013-01-22 DIAGNOSIS — R310 Gross hematuria: Secondary | ICD-10-CM | POA: Diagnosis not present

## 2013-01-30 ENCOUNTER — Telehealth: Payer: Self-pay | Admitting: Nurse Practitioner

## 2013-01-30 NOTE — Telephone Encounter (Signed)
I left a msg w/ pam surgical scheduler/ that Dr Percival Spanish and nurse will be back tomorrow and I will forward msg to both of them.

## 2013-01-30 NOTE — Telephone Encounter (Signed)
New Problem:  Pam from Dr. Ralene Muskrat Office is checking on the status of a surgical clearance form that was faxed on 01/22/13. Pam is requesting a call back.

## 2013-01-31 ENCOUNTER — Other Ambulatory Visit: Payer: Self-pay | Admitting: Urology

## 2013-01-31 ENCOUNTER — Telehealth: Payer: Self-pay | Admitting: Cardiology

## 2013-01-31 NOTE — Telephone Encounter (Signed)
Received request from Nurse fax box, documents faxed for surgical clearance. [id number] Fax number: [phone] Attention: [phone]/[phone]

## 2013-01-31 NOTE — Telephone Encounter (Signed)
Paperwork completed and signed - taken to MR to be faxed today.

## 2013-02-03 ENCOUNTER — Telehealth: Payer: Self-pay | Admitting: Cardiology

## 2013-02-03 NOTE — Telephone Encounter (Signed)
New Problem:  Pam is calling to inform the nurse about pt's Lovanox-bridging for patient's surgery on 02/18/13. States she was calling to give the nurse the date. Pam is also requesting a call back.

## 2013-02-03 NOTE — Telephone Encounter (Signed)
Will forward to CC to address the Lovenox bridging.

## 2013-02-10 ENCOUNTER — Encounter (HOSPITAL_COMMUNITY): Payer: Self-pay | Admitting: Pharmacy Technician

## 2013-02-10 NOTE — Telephone Encounter (Signed)
S/w pt's son-in-law is aware per [name], NP that we changed pt's appointment post hospital visit

## 2013-02-11 NOTE — Telephone Encounter (Signed)
I have reviewed the need for anticoagulation/bridging. Has bladder tumors and has had ongoing issues with hematuria. He is felt to be at high risk for bleeding post op as well. He is in chronic atrial fib and has a mechanical [employer]. [employer] AVR.   Discussed with Ronnald Collum, PHD here with the coumadin clinic - we will plan on bridging him prior to his surgery and stopping coumadin 5 days prior. We would like to restart his coumadin the day after surgery - February 11th. He has a follow up visit in the office on Friday, February 13th and will plan on restarting his Lovenox at that time (72 hours post op) as long as he is doing well.    Please send this message to "Pam" with Dr. [name] at [facility]

## 2013-02-11 NOTE — Telephone Encounter (Signed)
I am faxing over to [facility] at Dr. Ralene Muskrat office @ [phone] [place] note for surgical clearance and will hold on to the paper work.

## 2013-02-12 ENCOUNTER — Encounter (HOSPITAL_COMMUNITY): Payer: Self-pay

## 2013-02-12 ENCOUNTER — Ambulatory Visit (INDEPENDENT_AMBULATORY_CARE_PROVIDER_SITE_OTHER): Payer: Medicare Other | Admitting: Pharmacist

## 2013-02-12 ENCOUNTER — Encounter (HOSPITAL_COMMUNITY)
Admission: RE | Admit: 2013-02-12 | Discharge: 2013-02-12 | Disposition: A | Discharge status: Home / Self Care | Payer: Medicare Other | Source: Ambulatory Visit | Attending: Urology | Admitting: Urology

## 2013-02-12 DIAGNOSIS — Z7901 Long term (current) use of anticoagulants: Secondary | ICD-10-CM | POA: Diagnosis not present

## 2013-02-12 DIAGNOSIS — I482 Chronic atrial fibrillation, unspecified: Secondary | ICD-10-CM

## 2013-02-12 DIAGNOSIS — I4891 Unspecified atrial fibrillation: Secondary | ICD-10-CM | POA: Diagnosis not present

## 2013-02-12 DIAGNOSIS — Z954 Presence of other heart-valve replacement: Secondary | ICD-10-CM

## 2013-02-12 DIAGNOSIS — Z5181 Encounter for therapeutic drug level monitoring: Secondary | ICD-10-CM | POA: Diagnosis not present

## 2013-02-12 DIAGNOSIS — Z01812 Encounter for preprocedural laboratory examination: Secondary | ICD-10-CM | POA: Insufficient documentation

## 2013-02-12 DIAGNOSIS — Z952 Presence of prosthetic heart valve: Secondary | ICD-10-CM

## 2013-02-12 HISTORY — DX: Unspecified convulsions: R56.9

## 2013-02-12 HISTORY — DX: Personal history of other malignant neoplasm of skin: Z85.828

## 2013-02-12 HISTORY — DX: Neoplasm of unspecified behavior of bladder: D49.4

## 2013-02-12 HISTORY — DX: Malignant (primary) neoplasm, unspecified: C80.1

## 2013-02-12 HISTORY — DX: Personal history of other infectious and parasitic diseases: Z86.19

## 2013-02-12 LAB — CBC
HEMATOCRIT: 43.1 % (ref 39.0–52.0)
Hemoglobin: 12.9 g/dL — ABNORMAL LOW (ref 13.0–17.0)
MCH: 28.1 pg (ref 26.0–34.0)
MCHC: 29.9 g/dL — AB (ref 30.0–36.0)
MCV: 93.9 fL (ref 78.0–100.0)
Platelets: 144 10*3/uL — ABNORMAL LOW (ref 150–400)
RBC: 4.59 MIL/uL (ref 4.22–5.81)
RDW: 14.8 % (ref 11.5–15.5)
WBC: 7.7 10*3/uL (ref 4.0–10.5)

## 2013-02-12 LAB — POCT INR: INR: 2.5

## 2013-02-12 LAB — BASIC METABOLIC PANEL
BUN: 34 mg/dL — ABNORMAL HIGH (ref 6–23)
CO2: 31 mEq/L (ref 19–32)
CREATININE: 1.45 mg/dL — AB (ref 0.50–1.35)
Calcium: 9.1 mg/dL (ref 8.4–10.5)
Chloride: 99 mEq/L (ref 96–112)
GFR, EST AFRICAN AMERICAN: 47 mL/min — AB (ref >90)
GFR, EST NON AFRICAN AMERICAN: 41 mL/min — AB (ref >90)
Glucose, Bld: 106 mg/dL — ABNORMAL HIGH (ref 70–99)
Potassium: 4.9 mEq/L (ref 3.7–5.3)
Sodium: 141 mEq/L (ref 137–147)

## 2013-02-12 LAB — APTT: aPTT: 33 seconds (ref 24–37)

## 2013-02-12 LAB — PROTIME-INR
INR: 2.04 — ABNORMAL HIGH (ref 0.00–1.49)
Prothrombin Time: 22.4 seconds — ABNORMAL HIGH (ref 11.6–15.2)

## 2013-02-12 MED ORDER — ENOXAPARIN SODIUM 80 MG/0.8ML ~~LOC~~ SOLN: 80.0000 mg | SUBCUTANEOUS | Status: DC

## 2013-02-12 NOTE — Progress Notes (Signed)
BMET faxed to Dr. [name]

## 2013-02-12 NOTE — Patient Instructions (Addendum)
Eric Wheeler  02/12/2013                           YOUR PROCEDURE IS SCHEDULED ON: 02/18/13               PLEASE REPORT TO SHORT STAY CENTER AT :  5:30 AM               CALL THIS NUMBER IF ANY PROBLEMS THE DAY OF SURGERY :               [phone]                   NO VISITORS UNDER AGE 78 DUE TO FLU RESTRICTIONS                REMEMBER:   Do not eat food or drink liquids AFTER MIDNIGHT    Take these medicines the morning of surgery with A SIP OF WATER: CARVEDILOL   Do not wear jewelry, make-up   Do not wear lotions, powders, or perfumes.   Do not shave legs or underarms 12 hrs. before surgery (men may shave face)  Do not bring valuables to the hospital.  Contacts, dentures or bridgework may not be worn into surgery.  Leave suitcase in the car. After surgery it may be brought to your room.  For patients admitted to the hospital more than one night, checkout time is 11:00                          The day of discharge.   Patients discharged the day of surgery will not be allowed to drive home                             If going home same day of surgery, must have someone stay with you first                           24 hrs at home and arrange for some one to drive you home from hospital.    Special Instructions:   Please read over the following fact sheets that you were given:               1. [place]                                                X_____________________________________________________________________        Failure to follow these instructions may result in cancellation of your surgery

## 2013-02-12 NOTE — Patient Instructions (Addendum)
2/4- Coumadin 1 tablet 2/5- No Coumadin or Lovenox 2/6- Lovenox 80mg  injection first thing in the morning 2/7- Lovenox 80mg  injection first thing in the morning 2/8- Lovenox 80mg  injection first thing in the morning 2/9- Lovenox 80mg  injection first thing in the morning 2/10- Day of Procedure.  DO NOT TAKE ANY LOVENOX OR COUMADIN 2/11- Coumadin 1 tablet 2/12- Coumadin 1/2 tablet 2/13- Appt with Cecille Rubin.

## 2013-02-17 NOTE — Progress Notes (Signed)
Spoke with son-in law Mr. Coble, to inform about surgery date and time change per Dr. Suzanne Boron arrive at 630 am and go to Short Stay [place] take medication as instructed. Do chlorhexidine shower bath Tuesday night and Wednesday morning.

## 2013-02-18 ENCOUNTER — Telehealth: Payer: Self-pay | Admitting: Nurse Practitioner

## 2013-02-18 NOTE — Telephone Encounter (Signed)
New Problem:  Pt's son n law, Fritz Pickerel, states the pt's surgery date has been moved from today to tomorrow. Fritz Pickerel is asking if the pt still needs to come in on this Friday to see her and for a coumadin check or if they should wait until next week. Fritz Pickerel  Would like a call back.

## 2013-02-18 NOTE — Telephone Encounter (Signed)
S/w pt's son in law is aware to keep f/u appointment on Friday, 2/13

## 2013-02-18 NOTE — H&P (Signed)
oblems Problems   1. Elevated prostate specific antigen (PSA) (790.93)  2. Gross hematuria (599.71)  3. Microscopic hematuria (599.72)  4. Nodular prostate with lower urinary tract symptoms (600.11)  5. Urinary tract infection (599.0)  6. Urinary tract infection (599.0)  History of Present Illness   Eric Wheeler returns today in f/u.  He was in in December with an episode of gross hematuria and saw Justice Rocher who thought he had a UTI and started him on an antibiotic.  The culture came back negative and the med was stopped.   The urine has cleared.  He had a CT scan that showed no renal or ureteral abnormalities but he had 2 right sided bladder wall lesions that could be trabeculation or tumors.  His prostate was quite large.  He has a history of BPH with BOO and prior retention.  He is currently on finsteride and terazosin.  He is voiding well and still has nocturia but only 1x.    He has a mild sting with voiding but no other complaints.   His UA today has 3-6 RBC's.  He has had prior PSA elevations to about 10 but we stopped checking that in 2010.   There have been no significant changes.   Past Medical History Problems   1. History of Arthritis (V13.4)  2. History of Gout (274.9)  3. History of cardiac disorder (V12.50)  4. History of congestive heart disease (V12.59)  5. History of heartburn (V12.79)  6. History of hypercholesterolemia (V12.29)  7. History of kidney stones (V13.01)  8. Microscopic hematuria (599.72)  9. History of Murmur (785.2)  10. History of Pyuria (791.9)  11. History of Skin Cancer (V10.83)  12. History of Urinary urgency (788.63)  13. History of Valvular Heart Disease (396.9)  Surgical History Problems   1. History of Biopsy Skin  2. History of Heart Valve Replacement  Current Meds  1. Carvedilol 6.25 MG Oral Tablet;  Therapy: (Recorded:27Oct2014) to Recorded  2. Finasteride 5 MG Oral Tablet; TAKE ONE TABLET BY MOUTH EVERY DAY;  Therapy: 27Oct2010 to  (Evaluate:23Apr2015)  Requested for: 28Apr2014; Last  Rx:28Apr2014 Ordered  3. Furosemide 40 MG Oral Tablet;  Therapy: (Recorded:22Oct2012) to Recorded  4. Simvastatin 10 MG Oral Tablet;  Therapy: (Recorded:27Oct2014) to Recorded  5. Terazosin HCl - 5 MG Oral Capsule; TAKE ONE CAPSULE BY MOUTH EVERY DAY;  Therapy: 21Aug2009 to (Evaluate:25Apr2015)  Requested for: 30Apr2014; Last  Rx:30Apr2014 Ordered  6. Warfarin Sodium 5 MG Oral Tablet;  Therapy: (Recorded:17Nov2008) to Recorded  Allergies Medication   1. Codeine Derivatives  2. Sulfa Drugs  Family History Problems   1. Family history of Cancer : Mother  2. Family history of Death In The Family Father : Father   Stroke     Age 20  3. Family history of Death In The Family Mother : Mother   Cancer     Age 26  4. Family history of Family Health Status Number Of Children   1 daughter  5. Family history of Stroke Syndrome (V17.1) : Father  Social History Problems   1. Denied: Alcohol Use  2. Caffeine Use   1  3. Former Smoker   Stop at age 76  4. Marital History - Currently Married  5. Occupation:   retired   Past and social history reviewed and updated.   He is isn't driving anymore.   Review of Systems  Genitourinary: no hematuria.  Gastrointestinal: no flank pain and no abdominal pain.  Constitutional:  no fever.    Vitals Vital Signs [Data Includes: Last 1 Day]  Recorded: [id number] 02:57PM  Blood Pressure: 139 / 70 Temperature: 97.7 F Heart Rate: 86  Physical Exam Constitutional: Well nourished and well developed . No acute distress.  Pulmonary: No respiratory distress and normal respiratory rhythm and effort.  Cardiovascular: Heart rate and rhythm are normal . No peripheral edema. His valve is audible.    Results/Data Urine [Data Includes: Last 1 Day]   [id number]  COLOR YELLOW   APPEARANCE CLEAR   SPECIFIC GRAVITY 1.025   pH 6.0   GLUCOSE NEG mg/dL  BILIRUBIN NEG   KETONE NEG mg/dL  BLOOD  SMALL   PROTEIN TRACE mg/dL  UROBILINOGEN 1 mg/dL  NITRITE NEG   LEUKOCYTE ESTERASE NEG   SQUAMOUS EPITHELIAL/HPF RARE   WBC 3-6 WBC/hpf  RBC 3-6 RBC/hpf  BACTERIA RARE   CRYSTALS NONE SEEN   CASTS NONE SEEN    Procedure  Procedure: Cystoscopy   Indication: Hematuria.  Informed Consent: Risks, benefits, and potential adverse events were discussed and informed consent was obtained from the patient.  Prep: The patient was prepped with betadine.  Procedure Note:  Urethral meatus:. No abnormalities.  Anterior urethra: No abnormalities.  Prostatic urethra: No abnormalities . Estimated length was 5 cm. There was visual obstruction of the prostatic urethra. The lateral and median prostatic lobes were enlarged. An enlarged intravesical median lobe was visualized.  Bladder: Visulization was clear. The ureteral orifices were not able to be identified. Examination of the bladder demonstrated moderate trabeculation and a diverticulum located on the right side of the bladder (with surrounding tumor). (He has a area suspicious for CIS on the right lateral wall and anterior bladder that is extensive and he has a 2-3cm papillary tumor in the CIS near a tic on the right posterior wall. ) The patient tolerated the procedure well.  Complications: None.    Assessment Assessed   1. Bladder cancer (188.9)  2. 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   Plan Gross hematuria   1. Cysto; Status:Hold For - Appointment,Date of Service; Requested QIH:[id number];  Health Maintenance   2. UA With REFLEX; [Do Not Release]; Status:Resulted - Requires Verification;   Done:  [id number] 02:11PM   He is going to need a TURBT.  I won't do [place] since he will probably need BCG.  I reviewed the risks of bleeding, infection, retention, bladder injury, need for second stage resection, thrombotic events and anesthetic  risks.  He will need to be off of warfarin preop and for a week post op.   Discussion/Summary    CC: Dr. Claris Gower and [facility]. [name]

## 2013-02-19 ENCOUNTER — Encounter (HOSPITAL_COMMUNITY): Payer: Medicare Other | Admitting: Anesthesiology

## 2013-02-19 ENCOUNTER — Ambulatory Visit (HOSPITAL_COMMUNITY): Payer: Medicare Other | Admitting: Anesthesiology

## 2013-02-19 ENCOUNTER — Observation Stay (HOSPITAL_COMMUNITY)
Admission: RE | Admit: 2013-02-19 | Discharge: 2013-02-21 | Disposition: A | Discharge status: Home / Self Care | Payer: Medicare Other | Source: Ambulatory Visit | Attending: Urology | Admitting: Urology

## 2013-02-19 ENCOUNTER — Encounter (HOSPITAL_COMMUNITY): Payer: Self-pay | Admitting: *Deleted

## 2013-02-19 ENCOUNTER — Encounter (HOSPITAL_COMMUNITY)
Admission: RE | Disposition: A | Discharge status: Home / Self Care | Payer: Self-pay | Source: Ambulatory Visit | Attending: Urology

## 2013-02-19 DIAGNOSIS — Z79899 Other long term (current) drug therapy: Secondary | ICD-10-CM | POA: Insufficient documentation

## 2013-02-19 DIAGNOSIS — N401 Enlarged prostate with lower urinary tract symptoms: Secondary | ICD-10-CM | POA: Insufficient documentation

## 2013-02-19 DIAGNOSIS — D090 Carcinoma in situ of bladder: Secondary | ICD-10-CM | POA: Diagnosis not present

## 2013-02-19 DIAGNOSIS — N320 Bladder-neck obstruction: Secondary | ICD-10-CM | POA: Insufficient documentation

## 2013-02-19 DIAGNOSIS — R569 Unspecified convulsions: Secondary | ICD-10-CM | POA: Insufficient documentation

## 2013-02-19 DIAGNOSIS — I10 Essential (primary) hypertension: Secondary | ICD-10-CM | POA: Insufficient documentation

## 2013-02-19 DIAGNOSIS — Z87891 Personal history of nicotine dependence: Secondary | ICD-10-CM | POA: Diagnosis not present

## 2013-02-19 DIAGNOSIS — Z882 Allergy status to sulfonamides status: Secondary | ICD-10-CM | POA: Insufficient documentation

## 2013-02-19 DIAGNOSIS — Z87442 Personal history of urinary calculi: Secondary | ICD-10-CM | POA: Insufficient documentation

## 2013-02-19 DIAGNOSIS — D075 Carcinoma in situ of prostate: Secondary | ICD-10-CM | POA: Diagnosis not present

## 2013-02-19 DIAGNOSIS — I5022 Chronic systolic (congestive) heart failure: Secondary | ICD-10-CM | POA: Insufficient documentation

## 2013-02-19 DIAGNOSIS — E7800 Pure hypercholesterolemia, unspecified: Secondary | ICD-10-CM | POA: Insufficient documentation

## 2013-02-19 DIAGNOSIS — N3289 Other specified disorders of bladder: Secondary | ICD-10-CM | POA: Diagnosis not present

## 2013-02-19 DIAGNOSIS — N400 Enlarged prostate without lower urinary tract symptoms: Secondary | ICD-10-CM | POA: Diagnosis not present

## 2013-02-19 DIAGNOSIS — I4891 Unspecified atrial fibrillation: Secondary | ICD-10-CM | POA: Insufficient documentation

## 2013-02-19 DIAGNOSIS — N323 Diverticulum of bladder: Secondary | ICD-10-CM | POA: Insufficient documentation

## 2013-02-19 DIAGNOSIS — Z87440 Personal history of urinary (tract) infections: Secondary | ICD-10-CM | POA: Diagnosis not present

## 2013-02-19 DIAGNOSIS — Z7901 Long term (current) use of anticoagulants: Secondary | ICD-10-CM

## 2013-02-19 DIAGNOSIS — Z885 Allergy status to narcotic agent status: Secondary | ICD-10-CM | POA: Diagnosis not present

## 2013-02-19 DIAGNOSIS — M109 Gout, unspecified: Secondary | ICD-10-CM | POA: Diagnosis not present

## 2013-02-19 DIAGNOSIS — Z954 Presence of other heart-valve replacement: Secondary | ICD-10-CM | POA: Insufficient documentation

## 2013-02-19 DIAGNOSIS — C679 Malignant neoplasm of bladder, unspecified: Principal | ICD-10-CM | POA: Diagnosis present

## 2013-02-19 DIAGNOSIS — I509 Heart failure, unspecified: Secondary | ICD-10-CM | POA: Diagnosis not present

## 2013-02-19 DIAGNOSIS — Z85828 Personal history of other malignant neoplasm of skin: Secondary | ICD-10-CM | POA: Insufficient documentation

## 2013-02-19 DIAGNOSIS — I482 Chronic atrial fibrillation, unspecified: Secondary | ICD-10-CM | POA: Diagnosis present

## 2013-02-19 DIAGNOSIS — Z952 Presence of prosthetic heart valve: Secondary | ICD-10-CM

## 2013-02-19 DIAGNOSIS — N403 Nodular prostate with lower urinary tract symptoms: Secondary | ICD-10-CM | POA: Insufficient documentation

## 2013-02-19 DIAGNOSIS — N138 Other obstructive and reflux uropathy: Secondary | ICD-10-CM | POA: Diagnosis not present

## 2013-02-19 DIAGNOSIS — R972 Elevated prostate specific antigen [PSA]: Secondary | ICD-10-CM | POA: Diagnosis not present

## 2013-02-19 DIAGNOSIS — D494 Neoplasm of unspecified behavior of bladder: Secondary | ICD-10-CM | POA: Diagnosis not present

## 2013-02-19 DIAGNOSIS — I5032 Chronic diastolic (congestive) heart failure: Secondary | ICD-10-CM | POA: Diagnosis present

## 2013-02-19 HISTORY — PX: TRANSURETHRAL RESECTION OF PROSTATE: SHX73

## 2013-02-19 HISTORY — DX: Chronic combined systolic (congestive) and diastolic (congestive) heart failure: I50.42

## 2013-02-19 HISTORY — PX: CYSTOSCOPY: SHX5120

## 2013-02-19 LAB — PROTIME-INR
INR: 1.11 (ref 0.00–1.49)
Prothrombin Time: 14.1 seconds (ref 11.6–15.2)

## 2013-02-19 SURGERY — TRANSURETHRAL RESECTION OF THE PROSTATE WITH GYRUS INSTRUMENTS
Anesthesia: General

## 2013-02-19 MED ORDER — MEPERIDINE HCL 50 MG/ML IJ SOLN: 6.2500 mg | INTRAMUSCULAR | Status: DC | PRN

## 2013-02-19 MED ORDER — ETOMIDATE 2 MG/ML IV SOLN
INTRAVENOUS | Status: AC
  Filled 2013-02-19: qty 10

## 2013-02-19 MED ORDER — CARVEDILOL 6.25 MG PO TABS
6.2500 mg | ORAL_TABLET | Freq: Two times a day (BID) | ORAL | Status: DC
  Administered 2013-02-19 – 2013-02-21 (×4): 6.25 mg via ORAL
  Filled 2013-02-19 (×6): qty 1

## 2013-02-19 MED ORDER — DIPHENHYDRAMINE HCL 50 MG/ML IJ SOLN
12.5000 mg | Freq: Four times a day (QID) | INTRAMUSCULAR | Status: DC | PRN
Start: 2013-02-19 — End: 2013-02-21

## 2013-02-19 MED ORDER — CIPROFLOXACIN IN D5W 200 MG/100ML IV SOLN
200.0000 mg | INTRAVENOUS | Status: AC
  Administered 2013-02-19: 200 mg via INTRAVENOUS
  Filled 2013-02-19: qty 100

## 2013-02-19 MED ORDER — DIPHENHYDRAMINE HCL 12.5 MG/5ML PO ELIX: 12.5000 mg | ORAL_SOLUTION | Freq: Four times a day (QID) | ORAL | Status: DC | PRN

## 2013-02-19 MED ORDER — HYDROMORPHONE HCL PF 1 MG/ML IJ SOLN: 0.5000 mg | INTRAMUSCULAR | Status: DC | PRN

## 2013-02-19 MED ORDER — ETOMIDATE 2 MG/ML IV SOLN
INTRAVENOUS | Status: DC | PRN
  Administered 2013-02-19: 8 mg via INTRAVENOUS

## 2013-02-19 MED ORDER — PROPOFOL 10 MG/ML IV BOLUS
INTRAVENOUS | Status: DC | PRN
  Administered 2013-02-19: 50 mg via INTRAVENOUS

## 2013-02-19 MED ORDER — FENTANYL CITRATE 0.05 MG/ML IJ SOLN
INTRAMUSCULAR | Status: AC
  Filled 2013-02-19: qty 2

## 2013-02-19 MED ORDER — ONDANSETRON HCL 4 MG/2ML IJ SOLN: 4.0000 mg | INTRAMUSCULAR | Status: DC | PRN

## 2013-02-19 MED ORDER — SODIUM CHLORIDE 0.9 % IR SOLN
Status: DC | PRN
  Administered 2013-02-19: 15000 mL via INTRAVESICAL

## 2013-02-19 MED ORDER — FENTANYL CITRATE 0.05 MG/ML IJ SOLN
INTRAMUSCULAR | Status: DC | PRN
  Administered 2013-02-19 (×4): 25 ug via INTRAVENOUS

## 2013-02-19 MED ORDER — LIDOCAINE HCL 2 % EX GEL
CUTANEOUS | Status: AC
  Filled 2013-02-19: qty 10

## 2013-02-19 MED ORDER — SODIUM CHLORIDE 0.9 % IJ SOLN
INTRAMUSCULAR | Status: AC
  Filled 2013-02-19: qty 10

## 2013-02-19 MED ORDER — PROMETHAZINE HCL 25 MG/ML IJ SOLN: 6.2500 mg | INTRAMUSCULAR | Status: DC | PRN

## 2013-02-19 MED ORDER — HYOSCYAMINE SULFATE 0.125 MG SL SUBL
0.1250 mg | SUBLINGUAL_TABLET | SUBLINGUAL | Status: DC | PRN
  Administered 2013-02-19 – 2013-02-21 (×4): 0.125 mg via ORAL
  Filled 2013-02-19 (×4): qty 1

## 2013-02-19 MED ORDER — SIMVASTATIN 10 MG PO TABS
10.0000 mg | ORAL_TABLET | Freq: Every day | ORAL | Status: DC
  Administered 2013-02-19 – 2013-02-20 (×2): 10 mg via ORAL
  Filled 2013-02-19 (×3): qty 1

## 2013-02-19 MED ORDER — ACETAMINOPHEN 325 MG PO TABS
650.0000 mg | ORAL_TABLET | ORAL | Status: DC | PRN
Start: 2013-02-19 — End: 2013-02-21

## 2013-02-19 MED ORDER — GLYCOPYRROLATE 0.2 MG/ML IJ SOLN
INTRAMUSCULAR | Status: DC | PRN
  Administered 2013-02-19: 0.4 mg via INTRAVENOUS

## 2013-02-19 MED ORDER — ONDANSETRON HCL 4 MG/2ML IJ SOLN
INTRAMUSCULAR | Status: DC | PRN
  Administered 2013-02-19: 4 mg via INTRAVENOUS

## 2013-02-19 MED ORDER — FENTANYL CITRATE 0.05 MG/ML IJ SOLN
25.0000 ug | INTRAMUSCULAR | Status: DC | PRN
  Administered 2013-02-19 (×2): 25 ug via INTRAVENOUS
  Administered 2013-02-19: 50 ug via INTRAVENOUS
  Administered 2013-02-19 (×2): 25 ug via INTRAVENOUS

## 2013-02-19 MED ORDER — FUROSEMIDE 40 MG PO TABS
40.0000 mg | ORAL_TABLET | Freq: Two times a day (BID) | ORAL | Status: DC | PRN
  Filled 2013-02-19: qty 1

## 2013-02-19 MED ORDER — KCL IN DEXTROSE-NACL 20-5-0.45 MEQ/L-%-% IV SOLN
INTRAVENOUS | Status: DC
  Administered 2013-02-19 – 2013-02-21 (×4): via INTRAVENOUS
  Filled 2013-02-19 (×5): qty 1000

## 2013-02-19 MED ORDER — HYDROCODONE-ACETAMINOPHEN 5-325 MG PO TABS
1.0000 | ORAL_TABLET | ORAL | Status: DC | PRN
  Administered 2013-02-19 – 2013-02-20 (×3): 1 via ORAL
  Filled 2013-02-19 (×3): qty 1

## 2013-02-19 MED ORDER — CIPROFLOXACIN HCL 250 MG PO TABS
250.0000 mg | ORAL_TABLET | Freq: Two times a day (BID) | ORAL | Status: DC
  Administered 2013-02-19 – 2013-02-21 (×4): 250 mg via ORAL
  Filled 2013-02-19 (×6): qty 1

## 2013-02-19 MED ORDER — LIDOCAINE HCL (CARDIAC) 20 MG/ML IV SOLN
INTRAVENOUS | Status: AC
  Filled 2013-02-19: qty 5

## 2013-02-19 MED ORDER — LACTATED RINGERS IV SOLN
INTRAVENOUS | Status: DC | PRN
  Administered 2013-02-19: 08:00:00 via INTRAVENOUS

## 2013-02-19 MED ORDER — TERAZOSIN HCL 5 MG PO CAPS
5.0000 mg | ORAL_CAPSULE | Freq: Every day | ORAL | Status: DC
  Administered 2013-02-19 – 2013-02-20 (×2): 5 mg via ORAL
  Filled 2013-02-19 (×3): qty 1

## 2013-02-19 MED ORDER — ONDANSETRON HCL 4 MG/2ML IJ SOLN
INTRAMUSCULAR | Status: AC
Start: 2013-02-19 — End: 2013-02-19
  Filled 2013-02-19: qty 2

## 2013-02-19 MED ORDER — ROCURONIUM BROMIDE 100 MG/10ML IV SOLN
INTRAVENOUS | Status: DC | PRN
  Administered 2013-02-19: 20 mg via INTRAVENOUS

## 2013-02-19 MED ORDER — LIDOCAINE HCL (CARDIAC) 20 MG/ML IV SOLN
INTRAVENOUS | Status: DC | PRN
  Administered 2013-02-19: 40 mg via INTRAVENOUS

## 2013-02-19 MED ORDER — PROPOFOL 10 MG/ML IV BOLUS
INTRAVENOUS | Status: AC
  Filled 2013-02-19: qty 20

## 2013-02-19 MED ORDER — EPHEDRINE SULFATE 50 MG/ML IJ SOLN
INTRAMUSCULAR | Status: AC
Start: 2013-02-19 — End: 2013-02-19
  Filled 2013-02-19: qty 1

## 2013-02-19 MED ORDER — BISACODYL 10 MG RE SUPP
10.0000 mg | Freq: Every day | RECTAL | Status: DC | PRN
  Filled 2013-02-19: qty 1

## 2013-02-19 MED ORDER — FINASTERIDE 5 MG PO TABS
5.0000 mg | ORAL_TABLET | Freq: Every day | ORAL | Status: DC
  Administered 2013-02-20 – 2013-02-21 (×2): 5 mg via ORAL
  Filled 2013-02-19 (×2): qty 1

## 2013-02-19 MED ORDER — DOCUSATE SODIUM 100 MG PO CAPS
100.0000 mg | ORAL_CAPSULE | Freq: Two times a day (BID) | ORAL | Status: DC
  Administered 2013-02-19 – 2013-02-21 (×4): 100 mg via ORAL
  Filled 2013-02-19 (×5): qty 1

## 2013-02-19 MED ORDER — NEOSTIGMINE METHYLSULFATE 1 MG/ML IJ SOLN
INTRAMUSCULAR | Status: DC | PRN
  Administered 2013-02-19: 3 mg via INTRAVENOUS

## 2013-02-19 MED ORDER — PHENYLEPHRINE HCL 10 MG/ML IJ SOLN
INTRAMUSCULAR | Status: AC
  Filled 2013-02-19: qty 1

## 2013-02-19 SURGICAL SUPPLY — 26 items
BAG URINE DRAINAGE (UROLOGICAL SUPPLIES) ×1 IMPLANT
BAG URO CATCHER STRL LF (DRAPE) ×2 IMPLANT
BLADE SURG 15 STRL LF DISP TIS (BLADE) IMPLANT
BLADE SURG 15 STRL SS (BLADE)
CATH FOLEY 2WAY SLVR 30CC 22FR (CATHETERS) ×1 IMPLANT
CATH FOLEY 3WAY 30CC 22FR (CATHETERS) IMPLANT
CATH HEMA 3WAY 30CC 24FR COUDE (CATHETERS) ×1 IMPLANT
CATH URET 5FR 28IN OPEN ENDED (CATHETERS) IMPLANT
DRAPE CAMERA CLOSED 9X96 (DRAPES) ×2 IMPLANT
ELECT BUTTON HF 24-28F 2 30DE (ELECTRODE) ×2 IMPLANT
ELECT HF RESECT BIPO 24F 45 ND (CUTTING LOOP) ×1 IMPLANT
ELECT LOOP MED HF 24F 12D (CUTTING LOOP) ×1 IMPLANT
ELECT LOOP MED HF 24F 12D CBL (CLIP) ×1 IMPLANT
ELECT REM PT RETURN 9FT ADLT (ELECTROSURGICAL)
ELECTRODE REM PT RTRN 9FT ADLT (ELECTROSURGICAL) ×1 IMPLANT
GLOVE SURG SS PI 8.0 STRL IVOR (GLOVE) ×3 IMPLANT
GOWN STRL REUS W/TWL XL LVL3 (GOWN DISPOSABLE) ×2 IMPLANT
HOLDER FOLEY CATH W/STRAP (MISCELLANEOUS) ×1 IMPLANT
IV NS IRRIG 3000ML ARTHROMATIC (IV SOLUTION) ×4 IMPLANT
KIT ASPIRATION TUBING (SET/KITS/TRAYS/PACK) ×1 IMPLANT
MANIFOLD NEPTUNE II (INSTRUMENTS) ×2 IMPLANT
PACK CYSTO (CUSTOM PROCEDURE TRAY) ×2 IMPLANT
SUT ETHILON 3 0 PS 1 (SUTURE) IMPLANT
SYR 30ML LL (SYRINGE) ×1 IMPLANT
SYRINGE IRR TOOMEY STRL 70CC (SYRINGE) ×1 IMPLANT
TUBING CONNECTING 10 (TUBING) ×4 IMPLANT

## 2013-02-19 NOTE — Interval H&P Note (Signed)
History and Physical Interval Note:  02/19/2013 8:23 AM  Eric Wheeler  has presented today for surgery, with the diagnosis of bladder tumor large  The various methods of treatment have been discussed with the patient and family. After consideration of risks, benefits and other options for treatment, the patient has consented to  Procedure(s): TRANSURETHRAL RESECTION OF THE PROSTATE WITH GYRUS INSTRUMENTS (N/A) CYSTOSCOPY (N/A) as a surgical intervention .  The patient's history has been reviewed, patient examined, no change in status, stable for surgery.  I have reviewed the patient's chart and labs.  Questions were answered to the patient's satisfaction.     Eric Wheeler J

## 2013-02-19 NOTE — Brief Op Note (Signed)
02/19/2013  9:50 AM  PATIENT:  Eric Wheeler  80 y.o. male  PRE-OPERATIVE DIAGNOSIS:  bladder tumor large  POST-OPERATIVE DIAGNOSIS:  same PROCEDURE:  Procedure(s): TRANSURETHRAL RESECTION OF LARGE BLADDER TUMOR WITH GYRUS INSTRUMENTS (N/A) CYSTOSCOPY (N/A)  SURGEON:  Surgeon(s) and Role:    * Irine Seal, MD - Primary  PHYSICIAN ASSISTANT:   ASSISTANTS: none   ANESTHESIA:   general  EBL:     BLOOD ADMINISTERED:none  DRAINS: Urinary Catheter (Foley)   LOCAL MEDICATIONS USED:  NONE  SPECIMEN:  Source of Specimen:  prostate tissue and bladder tumor tissue  DISPOSITION OF SPECIMEN:  PATHOLOGY  COUNTS:  YES  TOURNIQUET:  * No tourniquets in log *  DICTATION: .Other Dictation: Dictation Number [id number] PLAN OF CARE: Admit for overnight observation  PATIENT DISPOSITION:  PACU - hemodynamically stable.   Delay start of Pharmacological VTE agent (>24hrs) due to surgical blood loss or risk of bleeding: yes

## 2013-02-19 NOTE — Transfer of Care (Signed)
Immediate Anesthesia Transfer of Care Note  Patient: Eric Wheeler  Procedure(s) Performed: Procedure(s): TRANSURETHRAL RESECTION OF THE PROSTATE WITH GYRUS INSTRUMENTS (N/A) CYSTOSCOPY (N/A)  Patient Location: PACU  Anesthesia Type:General  Level of Consciousness: awake, alert  and oriented  Airway & Oxygen Therapy: Patient Spontanous Breathing and Patient connected to face mask oxygen  Post-op Assessment: Report given to PACU RN and Post -op Vital signs reviewed and stable  Post vital signs: Reviewed and stable  Complications: No apparent anesthesia complications

## 2013-02-19 NOTE — Progress Notes (Signed)
Nutrition Brief Note  Patient identified on the Malnutrition Screening Tool (MST) Report  Wt Readings from Last 15 Encounters:  02/19/13 164 lb (74.39 kg)  02/19/13 164 lb (74.39 kg)  02/12/13 164 lb (74.39 kg)  08/30/12 167 lb (75.751 kg)  04/09/12 176 lb (79.833 kg)  03/01/12 169 lb 12.8 oz (77.021 kg)  12/22/10 172 lb (78.019 kg)  08/22/10 173 lb 3.2 oz (78.563 kg)    Body mass index is 24.21 kg/(m^2). Patient meets criteria for Normal weight based on current BMI.   Current diet order is NPO post resection of prostate/bladder. Labs and medications reviewed.   Pt's family reported pt with good appetite. Wife noted "pt eats everything in sight". Maintains weight at 160-162 lbs as pt on diuretics. Pt weighs himself on daily basis. No questions regarding nutrition/diet. Eager for diet advancement.  No nutrition interventions warranted at this time. If nutrition issues arise, please consult RD.   Atlee Abide MS RD LDN Clinical Dietitian [phone]

## 2013-02-19 NOTE — Anesthesia Preprocedure Evaluation (Addendum)
Anesthesia Evaluation  Patient identified by MRN, date of birth, ID band Patient awake    Reviewed: Allergy & Precautions, H&P , NPO status , Patient's Chart, lab work & pertinent test results  Airway Mallampati: II TM Distance: >3 FB Neck ROM: Full    Dental no notable dental hx.    Pulmonary former smoker,  breath sounds clear to auscultation  Pulmonary exam normal       Cardiovascular hypertension, Pt. on medications +CHF + dysrhythmias Atrial Fibrillation Rhythm:Regular Rate:Normal     Neuro/Psych Seizures -,  negative neurological ROS  negative psych ROS   GI/Hepatic negative GI ROS, Neg liver ROS,   Endo/Other  negative endocrine ROS  Renal/GU negative Renal ROS  negative genitourinary   Musculoskeletal negative musculoskeletal ROS (+)   Abdominal   Peds negative pediatric ROS (+)  Hematology negative hematology ROS (+)   Anesthesia Other Findings   Reproductive/Obstetrics negative OB ROS                          Anesthesia Physical Anesthesia Plan  ASA: III  Anesthesia Plan: General   Post-op Pain Management:    Induction: Intravenous  Airway Management Planned: LMA  Additional Equipment:   Intra-op Plan:   Post-operative Plan:   Informed Consent: I have reviewed the patients History and Physical, chart, labs and discussed the procedure including the risks, benefits and alternatives for the proposed anesthesia with the patient or authorized representative who has indicated his/her understanding and acceptance.   Dental advisory given  Plan Discussed with: CRNA  Anesthesia Plan Comments:         Anesthesia Quick Evaluation

## 2013-02-19 NOTE — Progress Notes (Signed)
Patient ID: Eric Wheeler, male   DOB: June 16, 1919, 80 y.o.   MRN: [id number]  He is doing well post op.  He has some urgency and required a pain pill, but the foley is draining well.  The urine is blood tinged without clots.

## 2013-02-19 NOTE — Anesthesia Postprocedure Evaluation (Signed)
  Anesthesia Post-op Note  Patient: Eric Wheeler  Procedure(s) Performed: Procedure(s) (LRB): TRANSURETHRAL RESECTION OF THE PROSTATE /bladder WITH GYRUS INSTRUMENTS (N/A) CYSTOSCOPY (N/A)  Patient Location: PACU  Anesthesia Type: General  Level of Consciousness: awake and alert   Airway and Oxygen Therapy: Patient Spontanous Breathing  Post-op Pain: mild  Post-op Assessment: Post-op Vital signs reviewed, Patient's Cardiovascular Status Stable, Respiratory Function Stable, Patent Airway and No signs of Nausea or vomiting  Last Vitals:  Filed Vitals:   02/19/13 1243  BP: 153/77  Pulse: 70  Temp: 36.3 C  Resp: 18    Post-op Vital Signs: stable   Complications: No apparent anesthesia complications

## 2013-02-19 NOTE — Preoperative (Signed)
Beta Blockers   Reason not to administer Beta Blockers:Not Applicable 

## 2013-02-20 ENCOUNTER — Encounter (HOSPITAL_COMMUNITY): Payer: Self-pay | Admitting: Nurse Practitioner

## 2013-02-20 DIAGNOSIS — I4891 Unspecified atrial fibrillation: Secondary | ICD-10-CM

## 2013-02-20 DIAGNOSIS — Z7901 Long term (current) use of anticoagulants: Secondary | ICD-10-CM

## 2013-02-20 DIAGNOSIS — Z954 Presence of other heart-valve replacement: Secondary | ICD-10-CM

## 2013-02-20 DIAGNOSIS — C679 Malignant neoplasm of bladder, unspecified: Secondary | ICD-10-CM

## 2013-02-20 LAB — BASIC METABOLIC PANEL
BUN: 23 mg/dL (ref 6–23)
CO2: 26 mEq/L (ref 19–32)
CREATININE: 1.24 mg/dL (ref 0.50–1.35)
Calcium: 8.2 mg/dL — ABNORMAL LOW (ref 8.4–10.5)
Chloride: 102 mEq/L (ref 96–112)
GFR calc Af Amer: 57 mL/min — ABNORMAL LOW (ref >90)
GFR, EST NON AFRICAN AMERICAN: 49 mL/min — AB (ref >90)
GLUCOSE: 145 mg/dL — AB (ref 70–99)
Potassium: 4 mEq/L (ref 3.7–5.3)
SODIUM: 138 meq/L (ref 137–147)

## 2013-02-20 LAB — HEMOGLOBIN AND HEMATOCRIT, BLOOD
HEMATOCRIT: 37.3 % — AB (ref 39.0–52.0)
Hemoglobin: 11.7 g/dL — ABNORMAL LOW (ref 13.0–17.0)

## 2013-02-20 NOTE — Progress Notes (Signed)
On initial assessment, catheter was draining well, red in color, no clots noted. Upon reassessment, patient complained of pressure in lower abdomen, stating, "my bladder feels really full". Bladder scanned with 37 cc noted. Hand irrigated 50 cc, return seen. Patient given PRN med for spasms. Patient resting comfortably at this time. Will continue to monitor closely. Blanchard Kelch, RN

## 2013-02-20 NOTE — Op Note (Signed)
[facility], MAIN             ACCOUNT NO.:  [id number]  MEDICAL RECORD NO.:  [id number]  LOCATION:  55                         FACILITY:  [facility]  PHYSICIAN:  Marshall Cork. [name], M.D.    DATE OF BIRTH:  1919-08-08  DATE OF PROCEDURE:  02/19/2013 DATE OF DISCHARGE:                              OPERATIVE REPORT   PROCEDURE:  Transurethral resection of large bladder tumor.  PREOPERATIVE DIAGNOSIS:  Multifocal papillary bladder tumor.  POSTOPERATIVE DIAGNOSIS:  Multifocal papillary bladder tumor.  SURGEON:  Marshall Cork. [name], M.D.  ANESTHESIA:  General.  SPECIMENS: 1. Prostate middle lobe chips. 2. Bladder tumor chips.  BLOOD LOSS:  Minimal.  COMPLICATIONS:  None.  INDICATIONS:  Mr. Colberg is a 80 year old white male with history of BPH with bladder obstruction, recently presented with hematuria and was found to have multifocal papillary bladder tumors with contiguous areas of mucosal abnormality consistent with carcinoma in situ, and it was felt he would require transurethral resection, but no mitomycin-C since BCG will be indicated.  FINDINGS AND PROCEDURE:  He was given Cipro.  He was taken to the operating room where general anesthetic was induced.  He was placed in lithotomy position and fitted with PAS hose.  His perineum and genitalia were prepped with Betadine solution.  He was draped in usual sterile fashion.  Cystoscopy was performed using the 22-French scope and 12-degree lens. Examination revealed the normal urethra.  The external sphincter was intact.  The prostatic urethra was approximately 4 cm with trilobar hyperplasia with a moderately large middle lobe.  Inspection of the bladder revealed moderate severe trabeculation with some cellules and diverticuli.  There were multiple papillary fronds in the area of the trigone that obscured the ureteral orifices bilaterally.  There were also additional papillary fronds on the dome and there were significant flat  changes consistent with carcinoma in situ, that was in the contiguous area between the two papillary tumors.  Once thorough inspection was performed, the 28-French continuous flow resectoscope sheath was inserted, this was fitted with an [place] handle and a gyrus loop.  Saline was used as the irrigant.  The patient was given muscle relaxers well.  Initially, I had to resect the middle lobe to expose the trigone sufficiently to resect the bladder tumor. This specimen was collected and sent separately to reduce confusion by the pathologist.  I then resected the area of the trigone, this was approximately a 5 x 3-cm area.  The area of the right ureteral orifice was not heavily invested with tumor, but I never really saw the orifice. On the left, I also did not see the orifice, but that portion of the trigone was covered with papillary tumor and it was felt that ureteral orifice had probably been resected.  Once this area had been resected, hemostasis was achieved.  I turned my attention to the area on the dome, which was approximately 4 x 4-cm area of tumor with bulk of the papillary tumor removed.  Then, I attempted to fulgurate some of the contiguous area with carcinoma in situ; however, due to the diffuse nature of this finding, I do not believe that the entire tumor volume was eliminated; however, it was  felt that this would require later treatment with BCG, soon attempt was not made to fulgurate the entire potential carcinoma in situ burden.  Once the bulk of the tumor had been resected and hemostasis was achieved, the bladder was evacuated free of the chips.  Additional hemostasis was achieved in the prostate.  The scope was removed after all active bleeding was eliminated and all chips were removed.  A 22- French Foley catheter was inserted.  The balloon was filled with 30 mL of sterile fluid.  The catheter was irrigated with clear return and placed to straight drainage.  The  patient was taken down from lithotomy position.  Anesthesia was reversed, and he was moved to the recovery room in stable condition.  There were no complications.     Marshall Cork. [name], M.D.     JJW/MEDQ  D:  02/19/2013  T:  02/20/2013  Job:  [id number]

## 2013-02-20 NOTE — Progress Notes (Signed)
Patient ID: CASTLE LAMONS, male   DOB: November 26, 1919, 80 y.o.   MRN: [id number] 1 Day Post-Op  Subjective: [name] is doing well and only complains of bladder spasms.   He was irrigated a couple of times in the night with return of small clots and a bladder scan showed an empty bladder.   He has required levsin for the spasms.  His urine is clear this morning.   ROS: Negative except as above.   Objective: Vital signs in last 24 hours: Temp:  [97.4 F (36.3 C)-98.8 F (37.1 C)] 98.2 F (36.8 C) (02/12 0534) Pulse Rate:  [70-112] 106 (02/12 0534) Resp:  [14-18] 18 (02/12 0534) BP: (125-165)/(58-102) 129/67 mmHg (02/12 0534) SpO2:  [97 %-100 %] 97 % (02/12 0534) Weight:  [74.39 kg (164 lb)] 74.39 kg (164 lb) (02/11 1301)  Intake/Output from previous day: 02/11 0701 - 02/12 0700 In: 2478.8 [P.O.:510; I.V.:1798.8] Out: 1045 [Urine:1025; Blood:20] Intake/Output this shift:    General appearance: alert and no distress Resp: clear to auscultation bilaterally Cardio: regular but with mild tachycardia GI: soft, flat, NT  Lab Results:   Recent Labs  02/20/13 0445  HGB 11.7*  HCT 37.3*   BMET  Recent Labs  02/20/13 0445  NA 138  K 4.0  CL 102  CO2 26  GLUCOSE 145*  BUN 23  CREATININE 1.24  CALCIUM 8.2*   PT/INR  Recent Labs  02/19/13 0730  LABPROT 14.1  INR 1.11   ABG No results found for this basename: PHART, PCO2, PO2, HCO3,  in the last 72 hours  Studies/Results: No results found.  Anti-infectives: Anti-infectives   Start     Dose/Rate Route Frequency Ordered Stop   02/19/13 2000  ciprofloxacin (CIPRO) tablet 250 mg     250 mg Oral 2 times daily 02/19/13 1252     02/19/13 0714  ciprofloxacin (CIPRO) IVPB 200 mg     200 mg 100 mL/hr over 60 Minutes Intravenous 60 min pre-op 02/19/13 4268 02/19/13 0835      Current Facility-Administered Medications  Medication Dose Route Frequency Provider Last Rate Last Dose  . acetaminophen (TYLENOL) tablet 650  mg  650 mg Oral Q4H PRN Irine Seal, MD      . bisacodyl (DULCOLAX) suppository 10 mg  10 mg Rectal Daily PRN Irine Seal, MD      . carvedilol (COREG) tablet 6.25 mg  6.25 mg Oral BID WC Irine Seal, MD   6.25 mg at 02/19/13 1620  . ciprofloxacin (CIPRO) tablet 250 mg  250 mg Oral BID Irine Seal, MD   250 mg at 02/19/13 1949  . dextrose 5 % and 0.45 % NaCl with KCl 20 mEq/L infusion   Intravenous Continuous Irine Seal, MD 75 mL/hr at 02/20/13 0300    . diphenhydrAMINE (BENADRYL) injection 12.5 mg  12.5 mg Intravenous Q6H PRN Irine Seal, MD       Or  . diphenhydrAMINE (BENADRYL) 12.5 MG/5ML elixir 12.5 mg  12.5 mg Oral Q6H PRN Irine Seal, MD      . docusate sodium (COLACE) capsule 100 mg  100 mg Oral BID Irine Seal, MD   100 mg at 02/19/13 2145  . finasteride (PROSCAR) tablet 5 mg  5 mg Oral Daily Irine Seal, MD      . furosemide (LASIX) tablet 40 mg  40 mg Oral BID PRN Irine Seal, MD      . HYDROcodone-acetaminophen (NORCO/VICODIN) 5-325 MG per tablet 1-2 tablet  1-2 tablet Oral Q4H PRN  Irine Seal, MD   1 tablet at 02/19/13 1409  . HYDROmorphone (DILAUDID) injection 0.5-1 mg  0.5-1 mg Intravenous Q2H PRN Irine Seal, MD      . hyoscyamine (LEVSIN SL) SL tablet 0.125 mg  0.125 mg Oral Q4H PRN Irine Seal, MD   0.125 mg at 02/20/13 0259  . ondansetron (ZOFRAN) injection 4 mg  4 mg Intravenous Q4H PRN Irine Seal, MD      . simvastatin (ZOCOR) tablet 10 mg  10 mg Oral QHS Irine Seal, MD   10 mg at 02/19/13 2145  . terazosin (HYTRIN) capsule 5 mg  5 mg Oral QHS Irine Seal, MD   5 mg at 02/19/13 2145    Assessment: s/p Procedure(s): TRANSURETHRAL RESECTION OF THE PROSTATE /bladder WITH GYRUS INSTRUMENTS CYSTOSCOPY  He is doing well post op and his urine is clearing.   Plan: Because of the extent of the resection, I am going to keep him one more day with the foley.     LOS: 1 day    Nickey Kloepfer J 02/20/2013

## 2013-02-20 NOTE — Progress Notes (Signed)
  I have spoken to Cardiology and relayed my desire to keep him off anticoagulation for 1 week post op because of bleeding risks.  They will see him on rounds in the hospital.

## 2013-02-20 NOTE — Consult Note (Signed)
CARDIOLOGY CONSULT NOTE   Patient ID: Eric Wheeler MRN: [id number] DOB/AGE: 78-Oct-1921 78 y.o.  Admit date: 02/19/2013  Primary Physician   [name], NP Primary Cardiologist   Previously Dr. Doreatha Lew Reason for Consultation   Coumadin management in the setting of bladder cancer surgery, chronic afib and remote AVR with mechanical valve  HPI: Eric Wheeler is a 80 y.o. male with a history of chronic systolic heart failure with an EF of 45 to 50%, prior St. Jude AVR (1987), HTN and permanent atrial fib on chronic coumadin who is followed by Glean Hess. The patient was recently diagnosed with bladder cancer and has ongoing issues with hematuria. He was scheduled for surgery with Dr. [name] and was felt to be at high risk for bleeding post op. Bridging management was discussed with Ronnald Collum, PHD at the coumadin clinic. The plan was to bridge him prior to his surgery by stopping coumadin 5 days prior and to restart his coumadin the day after surgery. It was planned to restart his Lovenox 72 hours post op.  Yesterday he underwent transurethral resection of large bladder tumor where multifocal papillary bladder tumor was found. Dr. [name] would like to keep him off coumadin for a week post op. The patient is recovering well with some bladder spasms and his urine is clearing with foley cath. Urine bag is tinged red with blood.   Past Medical History  Diagnosis Date  . Chronic atrial fibrillation   . Chronic anticoagulation     a. afib/st. jude avr - goal 2.5-3.5   . BPH (benign prostatic hypertrophy)   . Hypertension   . History of kidney stones   . Chronic combined systolic and diastolic CHF (congestive heart failure)     a. EF 45 to 50% per echo in 2011 following exacerbation  . Gout   . Syncope   . History of skin cancer   . Bladder tumor     a. s/p resection 02/19/2013.  [name] Hx of scarlet fever   . Seizures 2014    evaluated by Dr. Jannifer Franklin at [facility] neurology -  no  cause determined - refered back to cardiologist - recommended loop recorder to monitor rythm but pt refused - continues to have seizures per family - notes in EPIC     Past Surgical History  Procedure Laterality Date  . Aortic valve replacement  1987    #21 MM ST JUDE PLACED BY DR.CHARLES WILSON  . Cataract extraction    . Transurethral resection of prostate N/A 02/19/2013    Procedure: TRANSURETHRAL RESECTION OF THE PROSTATE /bladder WITH GYRUS INSTRUMENTS;  Surgeon: Irine Seal, MD;  Location: [facility];  Service: Urology;  Laterality: N/A;  . Cystoscopy N/A 02/19/2013    Procedure: CYSTOSCOPY;  Surgeon: Irine Seal, MD;  Location: [facility];  Service: Urology;  Laterality: N/A;    Allergies  Allergen Reactions  . Ace Inhibitors     Dizziness   . Codeine Nausea And Vomiting  . Sulfa Drugs Cross Reactors Nausea And Vomiting    I have reviewed the patient's current medications . carvedilol  6.25 mg Oral BID WC  . ciprofloxacin  250 mg Oral BID  . docusate sodium  100 mg Oral BID  . finasteride  5 mg Oral Daily  . simvastatin  10 mg Oral QHS  . terazosin  5 mg Oral QHS   . dextrose 5 % and 0.45 % NaCl with KCl 20 mEq/L 75 mL/hr at 02/20/13  0300   acetaminophen, bisacodyl, diphenhydrAMINE, diphenhydrAMINE, furosemide, HYDROcodone-acetaminophen, HYDROmorphone (DILAUDID) injection, hyoscyamine, ondansetron  Prior to Admission medications   Medication Sig Start Date End Date Taking? Authorizing Provider  carvedilol (COREG) 6.25 MG tablet Take 6.25 mg by mouth 2 (two) times daily with a meal.   Yes Historical Provider, MD  enoxaparin (LOVENOX) 80 MG/0.8ML injection Inject 0.8 mLs (80 mg total) into the skin daily. 02/12/13  Yes Minus Breeding, MD  finasteride (PROSCAR) 5 MG tablet Take 5 mg by mouth daily.   Yes Historical Provider, MD  furosemide (LASIX) 40 MG tablet Take 40 mg by mouth 2 (two) times daily as needed for fluid.   Yes Historical Provider, MD  simvastatin (ZOCOR) 10 MG tablet  Take 10 mg by mouth at bedtime.   Yes Historical Provider, MD  terazosin (HYTRIN) 5 MG capsule Take 5 mg by mouth at bedtime.     Yes Historical Provider, MD  warfarin (COUMADIN) 5 MG tablet Take 5 mg by mouth every morning.    Historical Provider, MD     History   Social History  . Marital Status: Married    Spouse Name: N/A    Number of Children: N/A  . Years of Education: N/A   Occupational History  . Not on file.   Social History Main Topics  . Smoking status: Former Smoker    Quit date: 01/10/1971  . Smokeless tobacco: Not on file  . Alcohol Use: No  . Drug Use: No  . Sexual Activity: Not Currently   Other Topics Concern  . Not on file   Social History Narrative  . No narrative on file    Family Status  Relation Status Death Age  . Mother Deceased 74  . Father Deceased 19  . Sister Deceased   . Brother Deceased    Family History  Problem Relation Age of Onset  . Cancer Mother   . Heart disease Father   . Asthma Father   . Stroke Father   . Dementia Sister   . Heart disease Brother      ROS:  Full 14 point review of systems complete and found to be negative unless listed above.  Physical Exam: Blood pressure 129/67, pulse 106, temperature 98.2 F (36.8 C), temperature source Oral, resp. rate 18, height 5\' 9"  (1.753 m), weight 164 lb (74.39 kg), SpO2 97.00%.  General: Well developed, well nourished, male in no acute distress Head: Eyes PERRLA, No xanthomas.   Normocephalic and atraumatic, oropharynx without edema or exudate. Dentition:  Lungs: CTAB Heart:Heart irregular rate and rhythm with S1, S2  murmur. pulses are 2+ extrem.   Neck: No carotid bruits. No lymphadenopathy.  JVD. Abdomen: Bowel sounds present, abdomen soft and non-tender without masses or hernias noted. Msk:  No spine or cva tenderness. No weakness, no joint deformities or effusions. Extremities: No clubbing or cyanosis.  edema.  Neuro: Alert and oriented X 3. No focal deficits  noted. Psych:  Good affect, responds appropriately Skin: No rashes or lesions noted.  Labs:   Lab Results  Component Value Date   WBC 7.7 02/12/2013   HGB 11.7* 02/20/2013   HCT 37.3* 02/20/2013   MCV 93.9 02/12/2013   PLT 144* 02/12/2013    Recent Labs  02/19/13 0730  INR 1.11     Recent Labs Lab 02/20/13 0445  NA 138  K 4.0  CL 102  CO2 26  BUN 23  CREATININE 1.24  CALCIUM 8.2*  GLUCOSE 145*  Echo: Study Date: 11/09/2009 Indications: Atrial fibrillation - 427.31. CHF - 428.0 History: PMH: Mechanical aortic valve. Study Conclusions - Left ventricle: The cavity size was normal. Wall thickness was normal. Systolic function was mildly reduced. The estimated ejection fraction was in the range of 45% to 50%. Diffuse hypokinesis. Atrial fibrillation precludes evaluation of LV diastolic function. - Ventricular septum: Septal motion showed paradox. - Aortic valve: A mechanical prosthesis was present and functioning normally. Trivial regurgitation. Valve area: 1.07cm^2(VTI). Valve area: 1.12cm^2 (Vmax). - Mitral valve: Calcified annulus. Mild regurgitation. - Left atrium: The atrium was severely dilated. - Right ventricle: The cavity size was moderately dilated. Systolic function was mildly to moderately reduced. - Right atrium: The atrium was severely dilated. - Tricuspid valve: Moderate regurgitation. - Pulmonary arteries: Systolic pressure was mildly increased. PA peak pressure: 50mm Hg (S).   ECG:  None from this admission   Radiology:  No results found.  ASSESSMENT AND PLAN:    Active Problems:   Bladder cancer   S/P AVR (aortic valve replacement)   Chronic atrial fibrillation   Chronic systolic congestive heart failure   Long term (current) use of anticoagulants  Eric Wheeler is a 80 y.o. male with a history of chronic systolic heart failure with an EF of 45 to 50%, prior St. Jude AVR (1987), HTN and permanent atrial fib on chronic coumadin who  underwent bladder surgery yesterday for bladder cancer. Cardiology has been consulted for guidance on bridging anticoagulation management.  -Dr. [name] believes he is very high risk for bleeding post op and recommends he be off coumadin for 1 week. Ronnald Collum, PHD at the coumadin clinic reccomended coumadin be restarted the day after the surgery. See HPI.     SignedPerry Mount, PA-C 02/20/2013 2:12 PM  Pager [phone]  Co-Sign MD   I have examined the patient and reviewed assessment and plan and discussed with patient.  Agree with above as stated.   Holding anticoagulation due to bleeding risk.  ECG showed AFib preoperatively.  Irregularly irregular. Crisp S2 click.  Would plan on resuming coumadin on Sunday 5 mg.  Then resume usual dose per prior schedule on Monday.  Will arrange for recheck of INR on Wednesday 2/18.  Hopefully, he will be 2.0-3.0.  He states that his INR is usually around 2.3.  Prescriptions for carvedilol and potassium given to the patient.   Teja Costen S.

## 2013-02-21 ENCOUNTER — Ambulatory Visit: Payer: Medicare Other | Admitting: Nurse Practitioner

## 2013-02-21 DIAGNOSIS — Z7901 Long term (current) use of anticoagulants: Secondary | ICD-10-CM | POA: Diagnosis not present

## 2013-02-21 DIAGNOSIS — Z954 Presence of other heart-valve replacement: Secondary | ICD-10-CM | POA: Diagnosis not present

## 2013-02-21 DIAGNOSIS — I4891 Unspecified atrial fibrillation: Secondary | ICD-10-CM | POA: Diagnosis not present

## 2013-02-21 MED ORDER — WARFARIN SODIUM 5 MG PO TABS: 5.0000 mg | ORAL_TABLET | Freq: Every morning | ORAL | Status: DC

## 2013-02-21 MED ORDER — TRAMADOL HCL 50 MG PO TABS: 50.0000 mg | ORAL_TABLET | Freq: Four times a day (QID) | ORAL | Status: DC | PRN

## 2013-02-21 NOTE — Progress Notes (Signed)
Per MD, RN to insert 20 French foley if pt unable to void. At 1015 pt still unable to void after foley had been removed at 0505. 270ml of urine in bladder per bladder scan. Foley was inserted, to the Y, and urine return was immediately noted. MD was made aware and stated that pt would need f/u appointment on Monday, at [facility]. Pt and family made aware of appointment need, and educated on catheter care.

## 2013-02-21 NOTE — Progress Notes (Addendum)
Reviewed the plans for re-instituting coumadin. Valve is crisp. Continue with plan for re start of coumadin on Sunday. No heparin or lovenox overlap. See initial consult for details.

## 2013-02-21 NOTE — Discharge Summary (Signed)
Physician Discharge Summary  Patient ID: Eric Wheeler MRN: [id number] DOB/AGE: 07/08/1919 80 y.o.  Admit date: 02/19/2013 Discharge date: 02/21/2013  Admission Diagnoses:  Bladder cancer  Discharge Diagnoses:  Principal Problem:   Bladder cancer Active Problems:   Chronic atrial fibrillation   Chronic systolic congestive heart failure   S/P AVR (aortic valve replacement)   Long term (current) use of anticoagulants BPH with BOO  Past Medical History  Diagnosis Date  . Chronic atrial fibrillation   . Chronic anticoagulation     a. afib/st. jude avr - goal 2.5-3.5   . BPH (benign prostatic hypertrophy)   . Hypertension   . History of kidney stones   . Chronic combined systolic and diastolic CHF (congestive heart failure)     a. EF 45 to 50% per echo in 2011 following exacerbation  . Gout   . Syncope   . History of skin cancer   . Bladder tumor     a. s/p resection 02/19/2013.  [name] Hx of scarlet fever   . Seizures 2014    evaluated by Dr. Jannifer Franklin at [facility] neurology -  no cause determined - refered back to cardiologist - recommended loop recorder to monitor rythm but pt refused - continues to have seizures per family - notes in EPIC    Surgeries: Procedure(s): [place] on 02/19/2013   Consultants (if any):    Discharged Condition: Improved  Hospital Course: Eric Wheeler is an 80 y.o. male who was admitted 02/19/2013 with a diagnosis of Bladder cancer and went to the operating room on 02/19/2013 and underwent the above named procedures.  He was found to have extensive carcinoma in situ which was confirmed on pathology.  He had a large prostate middle lobe that required partial resection to access the tumor.   He has done well post operatively and his foley was removed at 5am today.   He has not voided yet at 7:30am.  If he is unable to void by 10am and foley will be reinserted and he will be  discharged home with the foley for a voiding trial on Monday.  He was seen by cardiology during this admission and will resume the warfarin on Sunday to have him start becoming therapeutic a week post op.     He was given perioperative antibiotics:  Anti-infectives   Start     Dose/Rate Route Frequency Ordered Stop   02/19/13 2000  ciprofloxacin (CIPRO) tablet 250 mg     250 mg Oral 2 times daily 02/19/13 1252     02/19/13 0714  ciprofloxacin (CIPRO) IVPB 200 mg     20 0 mg 100 mL/hr over 60 Minutes Intravenous 60 min pre-op 02/19/13 0714 02/19/13 0835    .  He was given sequential compression devices for DVT prophylaxis.  He benefited maximally from the hospital stay and there were no complications.    Recent vital signs:  Filed Vitals:   02/21/13 0557  BP: 142/62  Pulse: 98  Temp: 97.6 F (36.4 C)  Resp: 18    Recent laboratory studies:  Lab Results  Component Value Date   HGB 11.7* 02/20/2013   HGB 12.9* 02/12/2013   HGB 12.4* 08/30/2012   Lab Results  Component Value Date   WBC 7.7 02/12/2013   PLT 144* 02/12/2013   Lab Results  Component Value Date   INR 1.11 02/19/2013   Lab Results  Component Value Date   NA 138 02/20/2013  K 4.0 02/20/2013   CL 102 02/20/2013   CO2 26 02/20/2013   BUN 23 02/20/2013   CREATININE 1.24 02/20/2013   GLUCOSE 145* 02/20/2013    Discharge Medications:     Medication List    ASK your doctor about these medications       carvedilol 6.25 MG tablet  Commonly known as:  COREG  Take 6.25 mg by mouth 2 (two) times daily with a meal.     enoxaparin 80 MG/0.8ML injection  Commonly known as:  LOVENOX  Inject 0.8 mLs (80 mg total) into the skin daily.     finasteride 5 MG tablet  Commonly known as:  PROSCAR  Take 5 mg by mouth daily.     furosemide 40 MG tablet  Commonly known as:  LASIX  Take 40 mg by mouth 2 (two) times daily as needed for fluid.     simvastatin 10 MG tablet  Commonly known as:  ZOCOR  Take 10 mg by mouth at  bedtime.     terazosin 5 MG capsule  Commonly known as:  HYTRIN  Take 5 mg by mouth at bedtime.     warfarin 5 MG tablet  Commonly known as:  COUMADIN  Take 5 mg by mouth every morning.      He will resume the warfarin on Sunday per Cardiology.   Tramadol 50mg  po prn. Diagnostic Studies: No results found.  Disposition: 01-Home or Self Care  If he has to go home with a foley, he will be set up for a voiding trial on Monday.         SignedMalka So 02/21/2013, 7:30 AM

## 2013-02-21 NOTE — Discharge Instructions (Signed)
Cystoscopy, Care After   Refer to this sheet in the next few weeks. These instructions provide you with information on caring for yourself after your procedure. Your caregiver may also give you more specific instructions. Your treatment has been planned according to current medical practices, but problems sometimes occur. Call your caregiver if you have any problems or questions after your procedure.   HOME CARE INSTRUCTIONS   Things you can do to ease any discomfort after your procedure include:   Drinking enough water and fluids to keep your urine clear or pale yellow.   Taking a warm bath to relieve any burning feelings.  SEEK IMMEDIATE MEDICAL CARE IF:   You have an increase in blood in your urine.   You notice blood clots in your urine.   You have difficulty passing urine.   You have the chills.   You have abdominal pain.   You have a fever or persistent symptoms for more than 2 3 days.   You have a fever and your symptoms suddenly get worse.  MAKE SURE YOU:   Understand these instructions.   Will watch your condition.   Will get help right away if you are not doing well or get worse.  Document Released: 07/15/2004 Document Revised: 08/28/2012 Document Reviewed: 06/19/2011   ExitCare Patient Information 2014 [employer].

## 2013-02-24 DIAGNOSIS — E878 Other disorders of electrolyte and fluid balance, not elsewhere classified: Secondary | ICD-10-CM | POA: Diagnosis not present

## 2013-02-24 DIAGNOSIS — R112 Nausea with vomiting, unspecified: Secondary | ICD-10-CM | POA: Diagnosis not present

## 2013-02-24 DIAGNOSIS — R310 Gross hematuria: Secondary | ICD-10-CM | POA: Diagnosis not present

## 2013-02-24 DIAGNOSIS — C679 Malignant neoplasm of bladder, unspecified: Secondary | ICD-10-CM | POA: Diagnosis not present

## 2013-02-25 ENCOUNTER — Ambulatory Visit: Payer: Medicare Other | Admitting: Nurse Practitioner

## 2013-02-27 ENCOUNTER — Encounter (HOSPITAL_COMMUNITY): Payer: Self-pay | Admitting: Emergency Medicine

## 2013-02-27 ENCOUNTER — Emergency Department (HOSPITAL_COMMUNITY): Payer: Medicare Other

## 2013-02-27 ENCOUNTER — Inpatient Hospital Stay (HOSPITAL_COMMUNITY)
Admission: EM | Admit: 2013-02-27 | Discharge: 2013-03-06 | DRG: 070 | Disposition: A | Discharge status: Home / Self Care | Payer: Medicare Other | Attending: Family Medicine | Admitting: Family Medicine

## 2013-02-27 DIAGNOSIS — G9341 Metabolic encephalopathy: Secondary | ICD-10-CM | POA: Diagnosis present

## 2013-02-27 DIAGNOSIS — R0989 Other specified symptoms and signs involving the circulatory and respiratory systems: Secondary | ICD-10-CM | POA: Diagnosis present

## 2013-02-27 DIAGNOSIS — R112 Nausea with vomiting, unspecified: Secondary | ICD-10-CM | POA: Diagnosis present

## 2013-02-27 DIAGNOSIS — R338 Other retention of urine: Secondary | ICD-10-CM | POA: Diagnosis present

## 2013-02-27 DIAGNOSIS — B958 Unspecified staphylococcus as the cause of diseases classified elsewhere: Secondary | ICD-10-CM | POA: Diagnosis present

## 2013-02-27 DIAGNOSIS — R4182 Altered mental status, unspecified: Secondary | ICD-10-CM | POA: Diagnosis not present

## 2013-02-27 DIAGNOSIS — N401 Enlarged prostate with lower urinary tract symptoms: Secondary | ICD-10-CM | POA: Diagnosis present

## 2013-02-27 DIAGNOSIS — J90 Pleural effusion, not elsewhere classified: Secondary | ICD-10-CM | POA: Diagnosis present

## 2013-02-27 DIAGNOSIS — D72829 Elevated white blood cell count, unspecified: Secondary | ICD-10-CM

## 2013-02-27 DIAGNOSIS — Z9849 Cataract extraction status, unspecified eye: Secondary | ICD-10-CM

## 2013-02-27 DIAGNOSIS — I428 Other cardiomyopathies: Secondary | ICD-10-CM | POA: Diagnosis present

## 2013-02-27 DIAGNOSIS — R319 Hematuria, unspecified: Secondary | ICD-10-CM

## 2013-02-27 DIAGNOSIS — I498 Other specified cardiac arrhythmias: Secondary | ICD-10-CM | POA: Diagnosis present

## 2013-02-27 DIAGNOSIS — R339 Retention of urine, unspecified: Secondary | ICD-10-CM | POA: Diagnosis not present

## 2013-02-27 DIAGNOSIS — N138 Other obstructive and reflux uropathy: Secondary | ICD-10-CM | POA: Diagnosis present

## 2013-02-27 DIAGNOSIS — I482 Chronic atrial fibrillation, unspecified: Secondary | ICD-10-CM

## 2013-02-27 DIAGNOSIS — I4891 Unspecified atrial fibrillation: Secondary | ICD-10-CM

## 2013-02-27 DIAGNOSIS — Z85828 Personal history of other malignant neoplasm of skin: Secondary | ICD-10-CM

## 2013-02-27 DIAGNOSIS — G9340 Encephalopathy, unspecified: Secondary | ICD-10-CM

## 2013-02-27 DIAGNOSIS — N390 Urinary tract infection, site not specified: Secondary | ICD-10-CM | POA: Diagnosis not present

## 2013-02-27 DIAGNOSIS — N1330 Unspecified hydronephrosis: Secondary | ICD-10-CM | POA: Diagnosis present

## 2013-02-27 DIAGNOSIS — K5900 Constipation, unspecified: Secondary | ICD-10-CM

## 2013-02-27 DIAGNOSIS — E876 Hypokalemia: Secondary | ICD-10-CM | POA: Diagnosis present

## 2013-02-27 DIAGNOSIS — I10 Essential (primary) hypertension: Secondary | ICD-10-CM | POA: Diagnosis not present

## 2013-02-27 DIAGNOSIS — D630 Anemia in neoplastic disease: Secondary | ICD-10-CM | POA: Diagnosis present

## 2013-02-27 DIAGNOSIS — Z7901 Long term (current) use of anticoagulants: Secondary | ICD-10-CM | POA: Diagnosis not present

## 2013-02-27 DIAGNOSIS — I509 Heart failure, unspecified: Secondary | ICD-10-CM | POA: Diagnosis present

## 2013-02-27 DIAGNOSIS — Z823 Family history of stroke: Secondary | ICD-10-CM

## 2013-02-27 DIAGNOSIS — Z8249 Family history of ischemic heart disease and other diseases of the circulatory system: Secondary | ICD-10-CM

## 2013-02-27 DIAGNOSIS — I5032 Chronic diastolic (congestive) heart failure: Secondary | ICD-10-CM | POA: Diagnosis not present

## 2013-02-27 DIAGNOSIS — R0609 Other forms of dyspnea: Secondary | ICD-10-CM | POA: Diagnosis present

## 2013-02-27 DIAGNOSIS — Z87442 Personal history of urinary calculi: Secondary | ICD-10-CM | POA: Diagnosis not present

## 2013-02-27 DIAGNOSIS — N179 Acute kidney failure, unspecified: Secondary | ICD-10-CM | POA: Diagnosis not present

## 2013-02-27 DIAGNOSIS — D638 Anemia in other chronic diseases classified elsewhere: Secondary | ICD-10-CM

## 2013-02-27 DIAGNOSIS — R310 Gross hematuria: Secondary | ICD-10-CM | POA: Diagnosis present

## 2013-02-27 DIAGNOSIS — I059 Rheumatic mitral valve disease, unspecified: Secondary | ICD-10-CM | POA: Diagnosis not present

## 2013-02-27 DIAGNOSIS — Z954 Presence of other heart-valve replacement: Secondary | ICD-10-CM

## 2013-02-27 DIAGNOSIS — C679 Malignant neoplasm of bladder, unspecified: Secondary | ICD-10-CM

## 2013-02-27 DIAGNOSIS — R944 Abnormal results of kidney function studies: Secondary | ICD-10-CM | POA: Diagnosis not present

## 2013-02-27 DIAGNOSIS — Z87891 Personal history of nicotine dependence: Secondary | ICD-10-CM

## 2013-02-27 DIAGNOSIS — I5023 Acute on chronic systolic (congestive) heart failure: Secondary | ICD-10-CM | POA: Diagnosis not present

## 2013-02-27 DIAGNOSIS — M109 Gout, unspecified: Secondary | ICD-10-CM | POA: Diagnosis present

## 2013-02-27 DIAGNOSIS — R34 Anuria and oliguria: Secondary | ICD-10-CM | POA: Diagnosis present

## 2013-02-27 DIAGNOSIS — N139 Obstructive and reflux uropathy, unspecified: Secondary | ICD-10-CM

## 2013-02-27 DIAGNOSIS — Z952 Presence of prosthetic heart valve: Secondary | ICD-10-CM

## 2013-02-27 DIAGNOSIS — R1110 Vomiting, unspecified: Secondary | ICD-10-CM

## 2013-02-27 LAB — COMPREHENSIVE METABOLIC PANEL
ALT: 13 U/L (ref 0–53)
ALT: 12 U/L (ref 0–53)
AST: 13 U/L (ref 0–37)
AST: 11 U/L (ref 0–37)
Albumin: 2.8 g/dL — ABNORMAL LOW (ref 3.5–5.2)
Albumin: 2.7 g/dL — ABNORMAL LOW (ref 3.5–5.2)
Alkaline Phosphatase: 85 U/L (ref 39–117)
Alkaline Phosphatase: 79 U/L (ref 39–117)
BUN: 84 mg/dL — AB (ref 6–23)
BUN: 79 mg/dL — ABNORMAL HIGH (ref 6–23)
CALCIUM: 7.9 mg/dL — AB (ref 8.4–10.5)
CO2: 19 mEq/L (ref 19–32)
CO2: 20 mEq/L (ref 19–32)
CREATININE: 6.39 mg/dL — AB (ref 0.50–1.35)
Calcium: 7.8 mg/dL — ABNORMAL LOW (ref 8.4–10.5)
Chloride: 96 mEq/L (ref 96–112)
Chloride: 97 mEq/L (ref 96–112)
Creatinine, Ser: 5.43 mg/dL — ABNORMAL HIGH (ref 0.50–1.35)
GFR calc Af Amer: 8 mL/min — ABNORMAL LOW (ref >90)
GFR calc Af Amer: 10 mL/min — ABNORMAL LOW (ref >90)
GFR calc non Af Amer: 7 mL/min — ABNORMAL LOW (ref >90)
GFR calc non Af Amer: 8 mL/min — ABNORMAL LOW (ref >90)
GLUCOSE: 115 mg/dL — AB (ref 70–99)
Glucose, Bld: 140 mg/dL — ABNORMAL HIGH (ref 70–99)
Potassium: 5.2 mEq/L (ref 3.7–5.3)
Potassium: 4.7 mEq/L (ref 3.7–5.3)
SODIUM: 137 meq/L (ref 137–147)
Sodium: 136 mEq/L — ABNORMAL LOW (ref 137–147)
TOTAL PROTEIN: 6.7 g/dL (ref 6.0–8.3)
TOTAL PROTEIN: 6.5 g/dL (ref 6.0–8.3)
Total Bilirubin: 0.5 mg/dL (ref 0.3–1.2)
Total Bilirubin: 0.4 mg/dL (ref 0.3–1.2)

## 2013-02-27 LAB — CBC WITH DIFFERENTIAL/PLATELET
BASOS PCT: 0 % (ref 0–1)
Basophils Absolute: 0 10*3/uL (ref 0.0–0.1)
Basophils Absolute: 0 10*3/uL (ref 0.0–0.1)
Basophils Relative: 0 % (ref 0–1)
EOS ABS: 0 10*3/uL (ref 0.0–0.7)
EOS PCT: 0 % (ref 0–5)
EOS PCT: 0 % (ref 0–5)
Eosinophils Absolute: 0 10*3/uL (ref 0.0–0.7)
HCT: 29.7 % — ABNORMAL LOW (ref 39.0–52.0)
HEMATOCRIT: 33.3 % — AB (ref 39.0–52.0)
Hemoglobin: 10.7 g/dL — ABNORMAL LOW (ref 13.0–17.0)
Hemoglobin: 9.7 g/dL — ABNORMAL LOW (ref 13.0–17.0)
LYMPHS ABS: 0.7 10*3/uL (ref 0.7–4.0)
Lymphocytes Relative: 3 % — ABNORMAL LOW (ref 12–46)
Lymphocytes Relative: 5 % — ABNORMAL LOW (ref 12–46)
Lymphs Abs: 0.5 10*3/uL — ABNORMAL LOW (ref 0.7–4.0)
MCH: 29.2 pg (ref 26.0–34.0)
MCH: 29.4 pg (ref 26.0–34.0)
MCHC: 32.1 g/dL (ref 30.0–36.0)
MCHC: 32.7 g/dL (ref 30.0–36.0)
MCV: 91 fL (ref 78.0–100.0)
MCV: 90 fL (ref 78.0–100.0)
MONO ABS: 1.4 10*3/uL — AB (ref 0.1–1.0)
MONOS PCT: 8 % (ref 3–12)
Monocytes Absolute: 1.1 10*3/uL — ABNORMAL HIGH (ref 0.1–1.0)
Monocytes Relative: 8 % (ref 3–12)
NEUTROS ABS: 15.4 10*3/uL — AB (ref 1.7–7.7)
NEUTROS PCT: 88 % — AB (ref 43–77)
Neutro Abs: 12.9 10*3/uL — ABNORMAL HIGH (ref 1.7–7.7)
Neutrophils Relative %: 89 % — ABNORMAL HIGH (ref 43–77)
PLATELETS: 183 10*3/uL (ref 150–400)
Platelets: 165 10*3/uL (ref 150–400)
RBC: 3.66 MIL/uL — ABNORMAL LOW (ref 4.22–5.81)
RBC: 3.3 MIL/uL — AB (ref 4.22–5.81)
RDW: 15.1 % (ref 11.5–15.5)
RDW: 15 % (ref 11.5–15.5)
WBC: 17.4 10*3/uL — ABNORMAL HIGH (ref 4.0–10.5)
WBC: 14.8 10*3/uL — AB (ref 4.0–10.5)

## 2013-02-27 LAB — URINALYSIS, ROUTINE W REFLEX MICROSCOPIC
Glucose, UA: NEGATIVE mg/dL
Ketones, ur: NEGATIVE mg/dL
Nitrite: POSITIVE — AB
PROTEIN: 100 mg/dL — AB
Specific Gravity, Urine: 1.011 (ref 1.005–1.030)
Urobilinogen, UA: 1 mg/dL (ref 0.0–1.0)
pH: 7.5 (ref 5.0–8.0)

## 2013-02-27 LAB — MAGNESIUM: Magnesium: 2 mg/dL (ref 1.5–2.5)

## 2013-02-27 LAB — URINE MICROSCOPIC-ADD ON

## 2013-02-27 LAB — PROTIME-INR
INR: 2.38 — AB (ref 0.00–1.49)
INR: 2.81 — ABNORMAL HIGH (ref 0.00–1.49)
PROTHROMBIN TIME: 25.2 s — AB (ref 11.6–15.2)
Prothrombin Time: 28.6 seconds — ABNORMAL HIGH (ref 11.6–15.2)

## 2013-02-27 LAB — PHOSPHORUS: Phosphorus: 6.4 mg/dL — ABNORMAL HIGH (ref 2.3–4.6)

## 2013-02-27 LAB — APTT: APTT: 40 s — AB (ref 24–37)

## 2013-02-27 LAB — PROCALCITONIN: Procalcitonin: 0.13 ng/mL

## 2013-02-27 MED ORDER — TRAMADOL HCL 50 MG PO TABS
50.0000 mg | ORAL_TABLET | Freq: Four times a day (QID) | ORAL | Status: DC | PRN
  Administered 2013-03-01 – 2013-03-02 (×2): 50 mg via ORAL
  Filled 2013-02-27 (×2): qty 1

## 2013-02-27 MED ORDER — CARVEDILOL 6.25 MG PO TABS
6.2500 mg | ORAL_TABLET | Freq: Two times a day (BID) | ORAL | Status: DC
  Administered 2013-02-28 – 2013-03-06 (×13): 6.25 mg via ORAL
  Filled 2013-02-27 (×15): qty 1

## 2013-02-27 MED ORDER — PIPERACILLIN-TAZOBACTAM IN DEX 2-0.25 GM/50ML IV SOLN
2.2500 g | Freq: Three times a day (TID) | INTRAVENOUS | Status: DC
  Administered 2013-02-27 – 2013-03-02 (×8): 2.25 g via INTRAVENOUS
  Filled 2013-02-27 (×10): qty 50

## 2013-02-27 MED ORDER — ONDANSETRON HCL 4 MG/2ML IJ SOLN: 4.0000 mg | Freq: Four times a day (QID) | INTRAMUSCULAR | Status: DC | PRN

## 2013-02-27 MED ORDER — PIPERACILLIN-TAZOBACTAM 3.375 G IVPB
3.3750 g | Freq: Once | INTRAVENOUS | Status: AC
  Administered 2013-02-27: 3.375 g via INTRAVENOUS
  Filled 2013-02-27: qty 50

## 2013-02-27 MED ORDER — WARFARIN - PHARMACIST DOSING INPATIENT: Freq: Every day | Status: DC

## 2013-02-27 MED ORDER — FINASTERIDE 5 MG PO TABS
5.0000 mg | ORAL_TABLET | Freq: Every morning | ORAL | Status: DC
  Administered 2013-02-28 – 2013-03-06 (×7): 5 mg via ORAL
  Filled 2013-02-27 (×7): qty 1

## 2013-02-27 MED ORDER — SODIUM CHLORIDE 0.9 % IJ SOLN
3.0000 mL | Freq: Two times a day (BID) | INTRAMUSCULAR | Status: DC
  Administered 2013-02-27 – 2013-03-03 (×7): 3 mL via INTRAVENOUS

## 2013-02-27 MED ORDER — ONDANSETRON HCL 4 MG/2ML IJ SOLN
4.0000 mg | Freq: Once | INTRAMUSCULAR | Status: AC
  Administered 2013-02-27: 4 mg via INTRAVENOUS
  Filled 2013-02-27: qty 2

## 2013-02-27 MED ORDER — MORPHINE SULFATE 2 MG/ML IJ SOLN: 1.0000 mg | INTRAMUSCULAR | Status: DC | PRN

## 2013-02-27 MED ORDER — DEXTROSE 5 % IV SOLN
1.0000 g | Freq: Once | INTRAVENOUS | Status: DC
  Filled 2013-02-27: qty 10

## 2013-02-27 MED ORDER — ACETAMINOPHEN 325 MG PO TABS
650.0000 mg | ORAL_TABLET | Freq: Four times a day (QID) | ORAL | Status: DC | PRN
Start: 2013-02-27 — End: 2013-03-06
  Administered 2013-03-01: 650 mg via ORAL
  Filled 2013-02-27: qty 2

## 2013-02-27 MED ORDER — PHENAZOPYRIDINE HCL 100 MG PO TABS
100.0000 mg | ORAL_TABLET | Freq: Three times a day (TID) | ORAL | Status: DC | PRN
  Filled 2013-02-27 (×3): qty 1

## 2013-02-27 MED ORDER — IOHEXOL 300 MG/ML  SOLN
50.0000 mL | Freq: Once | INTRAMUSCULAR | Status: AC | PRN
  Administered 2013-02-27: 50 mL via ORAL

## 2013-02-27 MED ORDER — SIMVASTATIN 10 MG PO TABS
10.0000 mg | ORAL_TABLET | Freq: Every day | ORAL | Status: DC
  Administered 2013-02-27 – 2013-03-05 (×7): 10 mg via ORAL
  Filled 2013-02-27 (×8): qty 1

## 2013-02-27 MED ORDER — SODIUM CHLORIDE 0.9 % IV SOLN
INTRAVENOUS | Status: AC
  Administered 2013-02-27: 20:00:00 via INTRAVENOUS

## 2013-02-27 MED ORDER — TERAZOSIN HCL 5 MG PO CAPS
5.0000 mg | ORAL_CAPSULE | Freq: Every day | ORAL | Status: DC
  Administered 2013-02-27 – 2013-03-05 (×7): 5 mg via ORAL
  Filled 2013-02-27 (×8): qty 1

## 2013-02-27 MED ORDER — WARFARIN SODIUM 2.5 MG PO TABS
2.5000 mg | ORAL_TABLET | Freq: Once | ORAL | Status: AC
  Administered 2013-02-27: 2.5 mg via ORAL
  Filled 2013-02-27: qty 1

## 2013-02-27 MED ORDER — ONDANSETRON HCL 4 MG PO TABS
4.0000 mg | ORAL_TABLET | Freq: Four times a day (QID) | ORAL | Status: DC | PRN
Start: 2013-02-27 — End: 2013-03-06

## 2013-02-27 MED ORDER — ACETAMINOPHEN 650 MG RE SUPP: 650.0000 mg | Freq: Four times a day (QID) | RECTAL | Status: DC | PRN

## 2013-02-27 NOTE — Progress Notes (Signed)
ANTICOAGULATION CONSULT NOTE - Initial Consult  Pharmacy Consult for warfarin Indication: atrial fibrillation  Allergies  Allergen Reactions  . Ace Inhibitors Other (See Comments)    Dizziness   . Codeine Nausea And Vomiting  . Sulfa Drugs Cross Reactors Nausea And Vomiting    Patient Measurements: Body weight: 74.4kg on 2/11  Vital Signs: Temp: 97.7 F (36.5 C) (02/19 1824) Temp src: Oral (02/19 1824) BP: 112/70 mmHg (02/19 1728) Pulse Rate: 101 (02/19 1600)  Labs:  Recent Labs  02/27/13 1329  HGB 10.7*  HCT 33.3*  PLT 165  LABPROT 25.2*  INR 2.38*  CREATININE 6.39*    Medical History: Past Medical History  Diagnosis Date  . Chronic atrial fibrillation   . Chronic anticoagulation     a. afib/st. jude avr - goal 2.5-3.5   . BPH (benign prostatic hypertrophy)   . Hypertension   . History of kidney stones   . Gout   . Syncope   . History of skin cancer   . Bladder tumor     a. s/p resection 02/19/2013.  [name] Hx of scarlet fever   . Seizures 2014    evaluated by Dr. Jannifer Franklin at [facility] neurology -  no cause determined - refered back to cardiologist - recommended loop recorder to monitor rythm but pt refused - continues to have seizures per family - notes in EPIC  . Chronic combined systolic and diastolic CHF (congestive heart failure)     a. EF 45 to 50% per echo in 2011 following exacerbation    Medications:  Scheduled:  . [START ON 02/28/2013] carvedilol  6.25 mg Oral BID WC  . [START ON 02/28/2013] finasteride  5 mg Oral q morning - 10a  . simvastatin  10 mg Oral QHS  . sodium chloride  3 mL Intravenous Q12H  . terazosin  5 mg Oral QHS   Infusions:  . sodium chloride      Assessment: 56 yoM admitted 2/19 with UTI and ARF. Patient is well known to pharmacy from antibiotic dosing. Pt on on chronic warfarin PTA for atrial fibrillation, followed by [place] anti-coagulation clinic. Warfarin held been held recently for urologic procedure, restarted 2/11. Home  dose 2.5mg  daily except 5mg  every 3rd day. Last dose taken 2/18. INR on admission within therapeutic range at 2.38. Hgb on admission 10.7 (baseline appears ~12.5), plts low/stable at baseline. Pharmacy has been consulted to continue warfarin while inpatient.  Goal of Therapy:  INR 2-3 Monitor platelets by anticoagulation protocol: Yes   Plan:  - warfarin 2.5mg  tonight per home dosing regimen - daily PT/INR - CBC in AM and at least q72h while inpatient - pharmacy will follow up daily  Thank you for the consult.  Johny Drilling, PharmD, BCPS Pager: [phone] Pharmacy: [phone] 02/27/2013 7:04 PM

## 2013-02-27 NOTE — ED Notes (Addendum)
Patient with Hx of bladder cancer, enlarged prostate, afib, reports to ED for anuria since urethral catheter removal Monday, constipation x 3 days, and disorientation. Pt had surgery for bladder cancer, after which he had a catheterization, has been taking prescribed pain medications. Pt c/o rectal pain with straining, denies pain at moment.

## 2013-02-27 NOTE — Progress Notes (Signed)
ANTIBIOTIC CONSULT NOTE - INITIAL  Pharmacy Consult for Zosyn  Indication: UTI  Allergies  Allergen Reactions  . Ace Inhibitors Other (See Comments)    Dizziness   . Codeine Nausea And Vomiting  . Sulfa Drugs Cross Reactors Nausea And Vomiting    Patient Measurements: Weight: 74.4kg on 02/19/13 IBW: 70.7kg  Vital Signs: Temp: 97.7 F (36.5 C) (02/19 1824) Temp src: Oral (02/19 1824) BP: 112/70 mmHg (02/19 1728) Pulse Rate: 101 (02/19 1600) Intake/Output from this shift: Total I/O In: -  Out: 625 [Urine:625]  Labs:  Recent Labs  02/27/13 1329  WBC 17.4*  HGB 10.7*  PLT 165  CREATININE 6.39*    Medical History: Past Medical History  Diagnosis Date  . Chronic atrial fibrillation   . Chronic anticoagulation     a. afib/st. jude avr - goal 2.5-3.5   . BPH (benign prostatic hypertrophy)   . Hypertension   . History of kidney stones   . Gout   . Syncope   . History of skin cancer   . Bladder tumor     a. s/p resection 02/19/2013.  [name] Hx of scarlet fever   . Seizures 2014    evaluated by Dr. Jannifer Franklin at [facility] neurology -  no cause determined - refered back to cardiologist - recommended loop recorder to monitor rythm but pt refused - continues to have seizures per family - notes in EPIC  . Chronic combined systolic and diastolic CHF (congestive heart failure)     a. EF 45 to 50% per echo in 2011 following exacerbation    Assessment: 11 yoM admitted 2/19 from home with urinary retention, constipation, confusion since Monday. Recently underwent a large bladder tumor resection (multifocal papillary tumor) on 02/19/13 by Dr. [name] and was started on ciprofloxacin post-procedure. Pt states he has not urinated in 3 days. Pharmacy has been consulted to dose Zosyn for UTI.  Noted patient received Zosyn 3.375g IV x1 at 1545  Antiinfectives 2/19 >> Zosyn >>    Tmax: WBCs: elevated to 17.4 Renal: SCr 6.39 (baseline appears ~1.4), CrCl 8 ml/min GC and  normalized  Microbiology 2/19 urine: collected prior to IV abx admisnistration    Goal of Therapy:  eradication of infection, proper renal dosing of Zosyn  Plan:  - Zosyn 2.25g IV q8h to start at midnight - follow-up clinical course, culture results, renal function - follow-up antibiotic de-escalation and length of therapy  Thank you for the consult.  Johny Drilling, PharmD, BCPS Pager: [phone] Pharmacy: [phone] 02/27/2013 6:33 PM

## 2013-02-27 NOTE — H&P (Signed)
[facility]  History and Physical  SAMAR DASS RXV:[id number] DOB: Dec 11, 1919 DOA: 02/27/2013  Referring physician: ED physician   PCP: [name], NP   Chief Complaint: altered mental status   HPI:  80 year old male with a history of A. fib, hypertension, diastolic and systolic CHF, St. Jude's AVR on coumadin who recently underwent a large bladder tumor resection (multifocal papillary tumor) on 02/19/13 by Dr. [name] who now presented to [facility] ED after found to be more confused at home and initially noted three days prior to this admission. Daughter at bedside provided most of the details and explains she has also noted urinary retention since the foley was removed three days prior to this admission and and nearly no urine output since that time. This has also been associated with nausea and several episodes of non bloody vomiting, poor oral intake, malaise. Pt denies chest pain or shortness of breath.   In ED, pt noted to have Cr > 6 and was determined to be most likely post obstructive in etiology. [facility] asked to admit to telemetry bed and urology team has been consulted by ED doctor.   Assessment and Plan:  Principal Problem:  Acute encephalopathy  - secondary to acute infection, UTI and urinary retention form obstructive uropathy  - foley inserted; monitor urine output  - treat UTI with zosyn  - PT /OT evaluation once pt able to participate  Active Problems:  UTI (urinary tract infection)  - treat with zosyn, per pharmacy protocol  - follow up urine culture results  Acute renal failure/ Obstructive uropathy/ bilateral hydroureteronephrosis  - appreciate urology consult and their recommendations  - foley placed  - continue to monitor renal function, urine output  - may continue IV fluids for next 12 hours and then reassess if still needed  - hold lasix  Chronic atrial fibrillation  - coumadin per pharmacy  - rate controlled with coreg  - monitor for bleeds  Chronic  systolic congestive heart failure  - last 2 D ECHO in 2011 showed EF in 45% range  - IV fluids are at 75 cc/hr; will give IV fluids for next 12 hours and then reassess if still needed  - hold Lasix while pt on IVF  Bladder cancer  - per urology  Leukocytosis, unspecified  - likely due to UTI  - treat with zosyn as noted above  Anemia of chronic disease  - secondary to history of bladder cancer  - hemoglobin 10.6 on this admission  - no indications for transfusion   Code Status: Full  Family Communication: Pt and daughter at bedside  Disposition Plan: Admit to telemetry bed   Review of Systems:  Constitutional: Negative for diaphoresis.  HENT: Negative for hearing loss, ear pain, nosebleeds, congestion, sore throat, neck pain, tinnitus and ear discharge.  Eyes: Negative for blurred vision, double vision, photophobia, pain, discharge and redness.  Respiratory: Negative for cough, hemoptysis, wheezing and stridor.  Cardiovascular: Negative for claudication and leg swelling.  Gastrointestinal: Negative for heartburn, constipation, blood in stool and melena.  Genitourinary: Negative for flank pain.  Musculoskeletal: Negative for myalgias, back pain, joint pain and falls.  Skin: Negative for itching and rash.  Neurological: Negative for tingling, tremors, sensory change, speech change, focal weakness, loss of consciousness and headaches.  Endo/Heme/Allergies: Negative for environmental allergies and polydipsia. Does not bruise/bleed easily.  Psychiatric/Behavioral: Negative for suicidal ideas. The patient is not nervous/anxious.   Past Medical History   Diagnosis  Date   .  Chronic atrial fibrillation    .  Chronic anticoagulation      a. afib/st. jude avr - goal 2.5-3.5   .  BPH (benign prostatic hypertrophy)    .  Hypertension    .  History of kidney stones    .  Gout    .  Syncope    .  History of skin cancer    .  Bladder tumor      a. s/p resection 02/19/2013.   [name]  Hx of  scarlet fever    .  Seizures  2014     evaluated by Dr. Jannifer Franklin at [facility] neurology - no cause determined - refered back to cardiologist - recommended loop recorder to monitor rythm but pt refused - continues to have seizures per family - notes in EPIC   .  Chronic combined systolic and diastolic CHF (congestive heart failure)      a. EF 45 to 50% per echo in 2011 following exacerbation    Past Surgical History   Procedure  Laterality  Date   .  Aortic valve replacement   1987     #21 MM ST JUDE PLACED BY DR.CHARLES WILSON   .  Cataract extraction     .  Transurethral resection of prostate  N/A  02/19/2013     Procedure: TRANSURETHRAL RESECTION OF THE PROSTATE /bladder WITH GYRUS INSTRUMENTS; Surgeon: Irine Seal, MD; Location: [facility] ORS; Service: Urology; Laterality: N/A;   .  Cystoscopy  N/A  02/19/2013     Procedure: CYSTOSCOPY; Surgeon: Irine Seal, MD; Location: [facility] ORS; Service: Urology; Laterality: N/A;    Social History: reports that he quit smoking about 42 years ago. He has never used smokeless tobacco. He reports that he does not drink alcohol or use illicit drugs.  Allergies   Allergen  Reactions   .  Ace Inhibitors  Other (See Comments)     Dizziness   .  Codeine  Nausea And Vomiting   .  Sulfa Drugs Cross Reactors  Nausea And Vomiting    Family History   Problem  Relation  Age of Onset   .  Cancer  Mother    .  Heart disease  Father    .  Asthma  Father    .  Stroke  Father    .  Dementia  Sister    .  Heart disease  Brother     Medication  Sig   carvedilol (COREG) 6.25 MG tablet  Take 6.25 mg by mouth 2 (two) times daily with a meal.   finasteride (PROSCAR) 5 MG tablet  Take 5 mg by mouth every morning.   furosemide (LASIX) 40 MG tablet  Take 40 mg by mouth 2 (two) times daily as needed for fluid.   ondansetron (ZOFRAN-ODT) 4 MG disintegrating tablet  Take 4 mg by mouth every 6 (six) hours as needed for nausea or vomiting.   phenazopyridine (PYRIDIUM) 100 MG tablet  Take  100 mg by mouth 3 (three) times daily as needed for pain.   potassium chloride (KLOR-CON) 20 MEQ packet  Take 20 mEq by mouth 2 (two) times daily.   simvastatin (ZOCOR) 10 MG tablet  Take 10 mg by mouth at bedtime.   terazosin (HYTRIN) 5 MG capsule  Take 5 mg by mouth at bedtime.   traMADol (ULTRAM) 50 MG tablet  Take 50 mg by mouth every 6 (six) hours as needed for moderate pain.   warfarin (COUMADIN) 5 MG tablet  Take 2.5-5 mg by mouth See admin instructions. Take 2.5mg  every night except take 5mg  every third night    Physical Exam:  Filed Vitals:    02/27/13 1445  02/27/13 1545  02/27/13 1600  02/27/13 1728   BP:     112/70   Pulse:  100  115  101    Resp:  30  20  28     SpO2:  98%  95%  98%     Physical Exam  Constitutional: Appears well-developed and well-nourished. No distress.  HENT: Normocephalic. External right and left ear normal. Dry MM  Eyes: Conjunctivae and EOM are normal. PERRLA, no scleral icterus.  Neck: Normal ROM. Neck supple. No JVD. No tracheal deviation. No thyromegaly.  CVS: Regular rhythm, tachycardic, S1/S2 +, no murmurs, no gallops, no carotid bruit.  Pulmonary: Effort and breath sounds normal, no stridor, diminished breath sounds at bases  Abdominal: Soft. BS +, no distension, tenderness, rebound or guarding.  Musculoskeletal: Normal range of motion. No edema and no tenderness.  Lymphadenopathy: No lymphadenopathy noted, cervical, inguinal. Neuro: Alert. Normal reflexes, muscle tone coordination. No cranial nerve deficit.  Skin: Skin is warm and dry. No rash noted. Not diaphoretic. No erythema. No pallor.  Psychiatric: Normal mood and affect. Behavior, judgment, thought content normal.   Labs on Admission:  Basic Metabolic Panel:   Recent Labs  Lab  02/27/13 1329   NA  136*   K  5.2   CL  96   CO2  19   GLUCOSE  140*   BUN  84*   CREATININE  6.39*   CALCIUM  7.8*    Liver Function Tests:   Recent Labs  Lab  02/27/13 1329   AST  13   ALT  13    ALKPHOS  85   BILITOT  0.5   PROT  6.7   ALBUMIN  2.8*    CBC:   Recent Labs  Lab  02/27/13 1329   WBC  17.4*   NEUTROABS  15.4*   HGB  10.7*   HCT  33.3*   MCV  91.0   PLT  165    Radiological Exams on Admission:  Ct Abdomen Pelvis Wo Contrast 02/27/2013  Diffusely thick walled bladder with mild proximal bilateral hydroureteronephrosis. Given the history of bladder cancer, this may indicate tumor involvement although cystitis or posttreatment change could appear similar. Retroperitoneal and supravesical lymphadenopathy, with interval increase in size of the supravesical nodes which could be neoplastic or inflammatory. New trace bilateral pleural effusions.  Dg Abd Acute W/chest 02/27/2013  1. Mild distention of transverse and proximal colon with a transition point just beyond the and splenic flexure. This raises the possibility of diverticulitis.  2. Stable cardiomegaly without failure.  3. Fusion of the SI joints bilaterally and findings suggesting ankylosing spondylitis.   Faye Ramsay, MD  [facility]  Pager [phone]  If 7PM-7AM, please contact night-coverage  [email or web address]  Password [facility]  02/27/2013, 6:21 PM

## 2013-02-27 NOTE — ED Notes (Signed)
Lanelle Bal and Joellyn Quails did the cath.

## 2013-02-27 NOTE — ED Notes (Signed)
Pt had a very loose bowel movement in the bed. Bedside cleaning was performed.

## 2013-02-27 NOTE — ED Provider Notes (Signed)
TIME SEEN: 1:00 PM  CHIEF COMPLAINT: Altered mental status, urinary retention, constipation  HPI: Patient is a 80 year old male with a history of A. fib, hypertension, diastolic and systolic CHF, St. Jude's AVR on coumadin who recently underwent a large bladder tumor resection (multifocal papillary tumor) on 02/19/13 by Dr. [name] who presents emergency department with urinary retention, constipation, confusion since Monday, three days ago.  Most of the history is provided by the patient's daughter. She reports that he has had intermittent cognitive impairment for several years. It seems to be worse over the past several days since discharge from the hospital on 02/21/13.  She reports he was asking yesterday what floor of the hospital he was on when he was home.  Patient reports he has not urinated in 3 days.  Had his foley removed in Urology office three days ago.  He is not sure when his last bowel movement was. He states he is not passing gas. He denies that he is having any new pain. He has had intermittent nausea and vomiting. No known fever. Patient denies any new back pain, numbness or weakness, incontinence. Patient still reports that he has not been eating or drinking well over the past few days.  Daughter states she started taking pain medication on Monday, 3 days ago, due to bladder spasms but she does not know the name of the medication. Pt lives at home with elderly wife.  No recent h/o injury.  ROS: See HPI Constitutional: no fever  Eyes: no drainage  ENT: no runny nose   Cardiovascular:  no chest pain  Resp: no SOB  GI:  vomiting GU: no dysuria Integumentary: no rash  Allergy: no hives  Musculoskeletal: no leg swelling  Neurological: no slurred speech ROS otherwise negative  PAST MEDICAL HISTORY/PAST SURGICAL HISTORY:  Past Medical History  Diagnosis Date  . Chronic atrial fibrillation   . Chronic anticoagulation     a. afib/st. jude avr - goal 2.5-3.5   . BPH (benign prostatic  hypertrophy)   . Hypertension   . History of kidney stones   . Chronic combined systolic and diastolic CHF (congestive heart failure)     a. EF 45 to 50% per echo in 2011 following exacerbation  . Gout   . Syncope   . History of skin cancer   . Bladder tumor     a. s/p resection 02/19/2013.  [name] Hx of scarlet fever   . Seizures 2014    evaluated by Dr. Jannifer Franklin at [facility] neurology -  no cause determined - refered back to cardiologist - recommended loop recorder to monitor rythm but pt refused - continues to have seizures per family - notes in EPIC    MEDICATIONS:  Prior to Admission medications   Medication Sig Start Date End Date Taking? Authorizing Provider  carvedilol (COREG) 6.25 MG tablet Take 6.25 mg by mouth 2 (two) times daily with a meal.    Historical Provider, MD  enoxaparin (LOVENOX) 80 MG/0.8ML injection Inject 0.8 mLs (80 mg total) into the skin daily. 02/12/13   Minus Breeding, MD  finasteride (PROSCAR) 5 MG tablet Take 5 mg by mouth daily.    Historical Provider, MD  furosemide (LASIX) 40 MG tablet Take 40 mg by mouth 2 (two) times daily as needed for fluid.    Historical Provider, MD  hyoscyamine (LEVSIN SL) 0.125 MG SL tablet Place 0.125 mg under the tongue every 6 (six) hours as needed (bladder spasms).    Historical Provider, MD  simvastatin (ZOCOR) 10 MG tablet Take 10 mg by mouth at bedtime.    Historical Provider, MD  terazosin (HYTRIN) 5 MG capsule Take 5 mg by mouth at bedtime.      Historical Provider, MD  traMADol (ULTRAM) 50 MG tablet Take 1 tablet (50 mg total) by mouth every 6 (six) hours as needed. 02/21/13   Irine Seal, MD  warfarin (COUMADIN) 5 MG tablet Take 1 tablet (5 mg total) by mouth every morning. Resume on Sunday per cardiology instructions. 02/21/13   Irine Seal, MD    ALLERGIES:  Allergies  Allergen Reactions  . Ace Inhibitors     Dizziness   . Codeine Nausea And Vomiting  . Sulfa Drugs Cross Reactors Nausea And Vomiting    SOCIAL HISTORY:   History  Substance Use Topics  . Smoking status: Former Smoker    Quit date: 01/10/1971  . Smokeless tobacco: Not on file  . Alcohol Use: No    FAMILY HISTORY: Family History  Problem Relation Age of Onset  . Cancer Mother   . Heart disease Father   . Asthma Father   . Stroke Father   . Dementia Sister   . Heart disease Brother     EXAM: There were no vitals taken for this visit. CONSTITUTIONAL: Alert and oriented and responds appropriately to questions. Well-appearing; well-nourished, elderly HEAD: Normocephalic EYES: Conjunctivae clear, PERRL ENT: normal nose; no rhinorrhea; moist mucous membranes; pharynx without lesions noted NECK: Supple, no meningismus, no LAD  CARD: RRR; S1 and S2 appreciated; no murmurs, no clicks, no rubs, no gallops RESP: Normal chest excursion without splinting or tachypnea; breath sounds clear and equal bilaterally; no wheezes, no rhonchi, no rales,  ABD/GI: Normal bowel sounds; non-distended; soft, diffusely tender to palpation in the lower abdomen without guarding or rebound, no peritoneal signs RECTAL:  No gross blood or melena, no stool or fecal impaction in the rectal vault, slightly diminished rectal tone   BACK:  The back appears normal and is non-tender to palpation, there is no CVA tenderness EXT: Normal ROM in all joints; non-tender to palpation; no edema; normal capillary refill; no cyanosis    SKIN: Normal color for age and race; warm NEURO: Moves all extremities equally, cranial nerves II through XII intact, sensation to light touch intact diffusely PSYCH: The patient's mood and manner are appropriate. Grooming and personal hygiene are appropriate.  MEDICAL DECISION MAKING:  Patient here with altered mental status, urinary retention, constipation. Daughter called Dr. [name] today who advised pt to come to ED.  Will check labs, urine, place foley catheter, obtain abdominal xray.     ED PROGRESS: Patient has a leukocytosis of 17.4 with  left shift. He also has acute renal failure with creatinine of 6.39 that is likely postobstructive. Urine shows leukocytes, bacteria, hemoglobin and is nitrite positive. Urine culture pending. Will give IV Zosyn. Patient x-ray shows no obstruction. There is mild distention of the transverse and proximal colon that raises the question of diverticulitis. Patient's abdominal exam has improved after Foley catheter was placed as pt has approximately 1 L of urine retained.  However patient is still mildly tender to palpation in his lower abdomen and is actively vomiting. This may be do to his UTI however cannot exclude any other intra-abdominal pathology. Will obtain CT of his abdomen and pelvis with oral contrast only.  Will update Dr. [name], urology.   2:49 PM  Spoke with Dr. Janice Norrie with Urology who agrees with plan and admission to medicine.  Pt does not have a PCP.   4:00 PM  CT AP pending.  Signed out to Dr. Doy Mince who will follow up on CT scan and admit.  Family updated with plan.  [place], DO 02/27/13 1559

## 2013-02-27 NOTE — Progress Notes (Signed)
[facility] History and Physical  Eric Wheeler XVQ:[id number] DOB: [birth year] DOA: 02/27/2013  Referring physician: ED physician PCP: [name], NP   Chief Complaint: altered mental status  HPI:  80 year old male with a history of A. fib, hypertension, diastolic and systolic CHF, St. Jude's AVR on coumadin who recently underwent a large bladder tumor resection (multifocal papillary tumor) on 02/19/13 by Dr. [name] who now presented to [facility] ED after found to be more confused at home and initially noted three days prior to this admission. Daughter at bedside provided most of the details and explains she has also noted urinary retention since the foley was removed three days prior to this admission and and nearly no urine output since that time. This has also been associated with nausea and several episodes of non bloody vomiting, poor oral intake, malaise. Pt denies chest pain or shortness of breath.  In ED, pt noted to have Cr > 6 and was determined to be most likely post obstructive in etiology. [facility] asked to admit to telemetry bed and urology team has been consulted by ED doctor.   Assessment and Plan:  Principal Problem:   Acute encephalopathy - secondary to acute infection, UTI and urinary retention form obstructive uropathy - foley inserted; monitor urine output - treat UTI with zosyn - PT /OT evaluation once pt able to participate  Active Problems:   UTI (urinary tract infection) - treat with zosyn, per pharmacy protocol - follow up urine culture results   Acute renal failure/ Obstructive uropathy/ bilateral hydroureteronephrosis - appreciate urology consult and their recommendations - foley placed - continue to monitor renal function, urine output - may continue IV fluids for next 12 hours and then reassess if still needed - hold lasix   Chronic atrial fibrillation - coumadin per pharmacy - rate controlled with coreg - monitor for bleeds   Chronic systolic  congestive heart failure - last 2 D ECHO in 2011 showed EF in 45% range - IV fluids are at 75 cc/hr; will give IV fluids for next 12 hours and then reassess if still needed - hold Lasix while pt on IVF   Bladder cancer - per urology   Leukocytosis, unspecified - likely due to UTI - treat with zosyn as noted above    Anemia of chronic disease - secondary to history of bladder cancer - hemoglobin 10.6 on this admission - no indications for transfusion   Code Status: Full Family Communication: Pt and daughter at bedside Disposition Plan: Admit to telemetry bed   Review of Systems:  Constitutional: Negative for diaphoresis.  HENT: Negative for hearing loss, ear pain, nosebleeds, congestion, sore throat, neck pain, tinnitus and ear discharge.   Eyes: Negative for blurred vision, double vision, photophobia, pain, discharge and redness.  Respiratory: Negative for cough, hemoptysis, wheezing and stridor.   Cardiovascular: Negative for claudication and leg swelling.  Gastrointestinal: Negative for heartburn, constipation, blood in stool and melena.  Genitourinary: Negative for flank pain.  Musculoskeletal: Negative for myalgias, back pain, joint pain and falls.  Skin: Negative for itching and rash.  Neurological: Negative for tingling, tremors, sensory change, speech change, focal weakness, loss of consciousness and headaches.  Endo/Heme/Allergies: Negative for environmental allergies and polydipsia. Does not bruise/bleed easily.  Psychiatric/Behavioral: Negative for suicidal ideas. The patient is not nervous/anxious.      Past Medical History  Diagnosis Date  . Chronic atrial fibrillation   . Chronic anticoagulation     a. afib/st. jude avr - goal 2.5-3.5   .  BPH (benign prostatic hypertrophy)   . Hypertension   . History of kidney stones   . Gout   . Syncope   . History of skin cancer   . Bladder tumor     a. s/p resection 02/19/2013.  [name] Hx of scarlet fever   . Seizures 2014     evaluated by [facility]. Jannifer Franklin at [facility] neurology -  no cause determined - refered back to cardiologist - recommended loop recorder to monitor rythm but pt refused - continues to have seizures per family - notes in EPIC  . Chronic combined systolic and diastolic CHF (congestive heart failure)     a. EF 45 to 50% per echo in 2011 following exacerbation    Past Surgical History  Procedure Laterality Date  . Aortic valve replacement  1987    #21 MM ST JUDE PLACED BY DR.CHARLES WILSON  . Cataract extraction    . Transurethral resection of prostate N/A 02/19/2013    Procedure: TRANSURETHRAL RESECTION OF THE PROSTATE /bladder WITH GYRUS INSTRUMENTS;  Surgeon: Irine Seal, MD;  Location: [facility];  Service: Urology;  Laterality: N/A;  . Cystoscopy N/A 02/19/2013    Procedure: CYSTOSCOPY;  Surgeon: Irine Seal, MD;  Location: [facility];  Service: Urology;  Laterality: N/A;    Social History:  reports that he quit smoking about 42 years ago. He has never used smokeless tobacco. He reports that he does not drink alcohol or use illicit drugs.  Allergies  Allergen Reactions  . Ace Inhibitors Other (See Comments)    Dizziness   . Codeine Nausea And Vomiting  . Sulfa Drugs Cross Reactors Nausea And Vomiting    Family History  Problem Relation Age of Onset  . Cancer Mother   . Heart disease Father   . Asthma Father   . Stroke Father   . Dementia Sister   . Heart disease Brother     Prior to Admission medications   Medication Sig Start Date End Date Taking? Authorizing Provider  carvedilol (COREG) 6.25 MG tablet Take 6.25 mg by mouth 2 (two) times daily with a meal.   Yes Historical Provider, MD  finasteride (PROSCAR) 5 MG tablet Take 5 mg by mouth every morning.    Yes Historical Provider, MD  furosemide (LASIX) 40 MG tablet Take 40 mg by mouth 2 (two) times daily as needed for fluid.   Yes Historical Provider, MD  ondansetron (ZOFRAN-ODT) 4 MG disintegrating tablet Take 4 mg by mouth every 6 (six)  hours as needed for nausea or vomiting.   Yes Historical Provider, MD  phenazopyridine (PYRIDIUM) 100 MG tablet Take 100 mg by mouth 3 (three) times daily as needed for pain.   Yes Historical Provider, MD  potassium chloride (KLOR-CON) 20 MEQ packet Take 20 mEq by mouth 2 (two) times daily.   Yes Historical Provider, MD  simvastatin (ZOCOR) 10 MG tablet Take 10 mg by mouth at bedtime.   Yes Historical Provider, MD  terazosin (HYTRIN) 5 MG capsule Take 5 mg by mouth at bedtime.     Yes Historical Provider, MD  traMADol (ULTRAM) 50 MG tablet Take 50 mg by mouth every 6 (six) hours as needed for moderate pain.   Yes Historical Provider, MD  warfarin (COUMADIN) 5 MG tablet Take 2.5-5 mg by mouth See admin instructions. Take 2.5mg  every night except take 5mg  every third night   Yes Historical Provider, MD    Physical Exam: Filed Vitals:   02/27/13 1445 02/27/13 1545 02/27/13 1600 02/27/13  1728  BP:    112/70  Pulse: 100 115 101   Resp: 30 20 28    SpO2: 98% 95% 98%     Physical Exam  Constitutional: Appears well-developed and well-nourished. No distress.  HENT: Normocephalic. External right and left ear normal. Dry MM Eyes: Conjunctivae and EOM are normal. PERRLA, no scleral icterus.  Neck: Normal ROM. Neck supple. No JVD. No tracheal deviation. No thyromegaly.  CVS: Regular rhythm, tachycardic, S1/S2 +, no murmurs, no gallops, no carotid bruit.  Pulmonary: Effort and breath sounds normal, no stridor, diminished breath sounds at bases  Abdominal: Soft. BS +,  no distension, tenderness, rebound or guarding.  Musculoskeletal: Normal range of motion. No edema and no tenderness.  Lymphadenopathy: No lymphadenopathy noted, cervical, inguinal. Neuro: Alert. Normal reflexes, muscle tone coordination. No cranial nerve deficit. Skin: Skin is warm and dry. No rash noted. Not diaphoretic. No erythema. No pallor.  Psychiatric: Normal mood and affect. Behavior, judgment, thought content normal.   Labs  on Admission:  Basic Metabolic Panel:  Recent Labs Lab 02/27/13 1329  NA 136*  K 5.2  CL 96  CO2 19  GLUCOSE 140*  BUN 84*  CREATININE 6.39*  CALCIUM 7.8*   Liver Function Tests:  Recent Labs Lab 02/27/13 1329  AST 13  ALT 13  ALKPHOS 85  BILITOT 0.5  PROT 6.7  ALBUMIN 2.8*   CBC:  Recent Labs Lab 02/27/13 1329  WBC 17.4*  NEUTROABS 15.4*  HGB 10.7*  HCT 33.3*  MCV 91.0  PLT 165   Radiological Exams on Admission: Ct Abdomen Pelvis Wo Contrast   02/27/2013    Diffusely thick walled bladder with mild proximal bilateral hydroureteronephrosis. Given the history of bladder cancer, this may indicate tumor involvement although cystitis or posttreatment change could appear similar.  Retroperitoneal and supravesical lymphadenopathy, with interval increase in size of the supravesical nodes which could be neoplastic or inflammatory.  New trace bilateral pleural effusions.     Dg Abd Acute W/chest   02/27/2013    1. Mild distention of transverse and proximal colon with a transition point just beyond the and splenic flexure. This raises the possibility of diverticulitis.  2. Stable cardiomegaly without failure.  3. Fusion of the SI joints bilaterally and findings suggesting ankylosing spondylitis.     Faye Ramsay, MD  [facility] Pager [phone]  If 7PM-7AM, please contact night-coverage [email or web address] Password [facility] 02/27/2013, 6:21 PM

## 2013-02-27 NOTE — ED Notes (Signed)
Pt had a very sudden, loose bowel movement. Bed side cleaning was done.

## 2013-02-28 DIAGNOSIS — N179 Acute kidney failure, unspecified: Secondary | ICD-10-CM

## 2013-02-28 LAB — GLUCOSE, CAPILLARY
Glucose-Capillary: 110 mg/dL — ABNORMAL HIGH (ref 70–99)
Glucose-Capillary: 67 mg/dL — ABNORMAL LOW (ref 70–99)

## 2013-02-28 LAB — CBC
HCT: 30.3 % — ABNORMAL LOW (ref 39.0–52.0)
Hemoglobin: 10 g/dL — ABNORMAL LOW (ref 13.0–17.0)
MCH: 29.7 pg (ref 26.0–34.0)
MCHC: 33 g/dL (ref 30.0–36.0)
MCV: 89.9 fL (ref 78.0–100.0)
PLATELETS: 168 10*3/uL (ref 150–400)
RBC: 3.37 MIL/uL — ABNORMAL LOW (ref 4.22–5.81)
RDW: 15.3 % (ref 11.5–15.5)
WBC: 9.2 10*3/uL (ref 4.0–10.5)

## 2013-02-28 LAB — PROTIME-INR
INR: 2.72 — ABNORMAL HIGH (ref 0.00–1.49)
Prothrombin Time: 27.9 seconds — ABNORMAL HIGH (ref 11.6–15.2)

## 2013-02-28 LAB — COMPREHENSIVE METABOLIC PANEL
ALBUMIN: 2.5 g/dL — AB (ref 3.5–5.2)
ALK PHOS: 63 U/L (ref 39–117)
ALT: 11 U/L (ref 0–53)
AST: 9 U/L (ref 0–37)
BUN: 74 mg/dL — ABNORMAL HIGH (ref 6–23)
CHLORIDE: 103 meq/L (ref 96–112)
CO2: 20 mEq/L (ref 19–32)
Calcium: 7.6 mg/dL — ABNORMAL LOW (ref 8.4–10.5)
Creatinine, Ser: 4.52 mg/dL — ABNORMAL HIGH (ref 0.50–1.35)
GFR calc Af Amer: 12 mL/min — ABNORMAL LOW (ref >90)
GFR calc non Af Amer: 10 mL/min — ABNORMAL LOW (ref >90)
Glucose, Bld: 84 mg/dL (ref 70–99)
POTASSIUM: 4.2 meq/L (ref 3.7–5.3)
Sodium: 140 mEq/L (ref 137–147)
Total Bilirubin: 0.4 mg/dL (ref 0.3–1.2)
Total Protein: 5.9 g/dL — ABNORMAL LOW (ref 6.0–8.3)

## 2013-02-28 LAB — TSH: TSH: 2.843 u[IU]/mL (ref 0.350–4.500)

## 2013-02-28 MED ORDER — DEXTROSE 50 % IV SOLN: 25.0000 mL | Freq: Once | INTRAVENOUS | Status: AC | PRN

## 2013-02-28 MED ORDER — WARFARIN SODIUM 2.5 MG PO TABS
2.5000 mg | ORAL_TABLET | Freq: Once | ORAL | Status: AC
  Administered 2013-02-28: 2.5 mg via ORAL
  Filled 2013-02-28: qty 1

## 2013-02-28 MED ORDER — GLUCOSE-VITAMIN C 4-6 GM-MG PO CHEW: 4.0000 | CHEWABLE_TABLET | ORAL | Status: DC | PRN

## 2013-02-28 MED ORDER — GLUCOSE 40 % PO GEL: 1.0000 | ORAL | Status: DC | PRN

## 2013-02-28 NOTE — Progress Notes (Signed)
Hypoglycemic Event  CBG: 67  Treatment: 15 GM carbohydrate snack  Symptoms: None  Follow-up CBG: [id number] CBG Result:110  Possible Reasons for Event: Inadequate meal intake patient NPO until this am.   Comments/MD notified:    Erma Heritage  Remember to initiate Hypoglycemia Order Set & complete

## 2013-02-28 NOTE — Progress Notes (Signed)
[facility] PROGRESS NOTE  Eric Wheeler [id number] DOB: February 19, 1919 DOA: 02/27/2013 PCP: [name], NP  Assessment/Plan: Acute encephalopathy  - secondary to acute infection, UTI and urinary retention form obstructive uropathy  - foley inserted; monitor urine output  - treat UTI with zosyn  - PT /OT evaluation once pt able to participate  - his encephalopathy is resolved.  Active Problems:  UTI (urinary tract infection)  - treat with zosyn, per pharmacy protocol  - follow up urine culture results  Acute renal failure/ Obstructive uropathy/ bilateral hydroureteronephrosis  - appreciate urology consult and their recommendations  - foley placed  - continue to monitor renal function, urine output  - hold lasix  Chronic atrial fibrillation  - coumadin per pharmacy  - rate controlled with coreg  - monitor for bleeds  Chronic systolic congestive heart failure  - last 2 D ECHO in 2011 showed EF in 45% range  - lasix held for acute renal failure.   Bladder cancer  - per urology  Leukocytosis, unspecified  - likely due to UTI  - treat with zosyn as noted above  Anemia of chronic disease  - secondary to history of bladder cancer  - hemoglobin 10.6 on this admission  - no indications for transfusion   Code Status: Full  Family Communication: Pt and daughter at bedside  Disposition Plan: Admit to telemetry bed      Consultants:  urology  Procedures:  none  Antibiotics:  Zosyn 2/19  HPI/Subjective: Comfortable.   Objective: Filed Vitals:   02/28/13 1316  BP: 111/67  Pulse: 112  Temp: 97.9 F (36.6 C)  Resp: 20    Intake/Output Summary (Last 24 hours) at 02/28/13 1901 Last data filed at 02/28/13 1700  Gross per 24 hour  Intake 1638.75 ml  Output   4000 ml  Net -2361.25 ml   Filed Weights   02/27/13 1902 02/28/13 0516  Weight: 74.39 kg (164 lb) 76.6 kg (168 lb 14 oz)    Exam:   General:  Alert afebrile  comforatble  Cardiovascular: s1s2  Respiratory: ctab  Abdomen: soft NT ND BS+  Musculoskeletal: no pedal edema.  Data Reviewed: Basic Metabolic Panel:  Recent Labs Lab 02/27/13 1329 02/27/13 2000 02/28/13 0405  NA 136* 137 140  K 5.2 4.7 4.2  CL 96 97 103  CO2 19 20 20   GLUCOSE 140* 115* 84  BUN 84* 79* 74*  CREATININE 6.39* 5.43* 4.52*  CALCIUM 7.8* 7.9* 7.6*  MG  --  2.0  --   PHOS  --  6.4*  --    Liver Function Tests:  Recent Labs Lab 02/27/13 1329 02/27/13 2000 02/28/13 0405  AST 13 11 9   ALT 13 12 11   ALKPHOS 85 79 63  BILITOT 0.5 0.4 0.4  PROT 6.7 6.5 5.9*  ALBUMIN 2.8* 2.7* 2.5*   No results found for this basename: LIPASE, AMYLASE,  in the last 168 hours No results found for this basename: AMMONIA,  in the last 168 hours CBC:  Recent Labs Lab 02/27/13 1329 02/27/13 2000 02/28/13 0405  WBC 17.4* 14.8* 9.2  NEUTROABS 15.4* 12.9*  --   HGB 10.7* 9.7* 10.0*  HCT 33.3* 29.7* 30.3*  MCV 91.0 90.0 89.9  PLT 165 183 168   Cardiac Enzymes: No results found for this basename: CKTOTAL, CKMB, CKMBINDEX, TROPONINI,  in the last 168 hours BNP (last 3 results)  Recent Labs  03/29/12 1615  PROBNP 2069.0*   CBG:  Recent Labs Lab 02/28/13  0744 02/28/13 0815  GLUCAP 67* 110*    No results found for this or any previous visit (from the past 240 hour(s)).   Studies: Ct Abdomen Pelvis Wo Contrast  02/27/2013   CLINICAL DATA:  Anuria, history of bladder cancer with removal of urinary catheter 4 days ago.  EXAM: CT ABDOMEN AND PELVIS WITHOUT CONTRAST  TECHNIQUE: Multidetector CT imaging of the abdomen and pelvis was performed following the standard protocol without intravenous contrast.  COMPARISON:  DG ABD ACUTE W/CHEST dated 02/27/2013; CT ABD-PEL WO/W CM dated 12/31/2012  FINDINGS: Trace pleural effusions are noted.  Unenhanced CT was performed per clinician order. Lack of IV contrast limits sensitivity and specificity, especially for evaluation of  abdominal/pelvic solid viscera. 1 cm lateral segment left hepatic lobe cyst is reidentified. Too small to characterize 5 mm lateral segment left hepatic lobe hypodense lesion image 9 is stable. Gallstones are noted within the decompressed otherwise normal-appearing gallbladder. Motion artifact degrades imaging at multiple levels. Adrenal glands, spleen, and pancreas are grossly unremarkable.  There has been interval development of mild hydroureteronephrosis to the level of the bladder which is partly decompressed but demonstrates partly visualized wall thickening of at least 1 cm image 67. A Foley catheter is in place. At the right bladder apex, there is a 0.7 cm dominant lymph node as well as other similar-appearing enlarged supravesical lymph nodes for example image 57. 0.8 cm periaortic node identified image 40. Pericaval 0.7 cm lymph node identified image 51, not significantly changed. The supravesical nodes are increase in size since previously but the dominant periaortic node is stable in size. There is trace fluid tracking along the ureters tracking to the pelvis. Marked enlargement of the prostate is again noted. Bilateral small inguinal nodes are stable.  No bowel wall thickening or focal segmental dilatation. Moderate atheromatous aortic calcification without aneurysm.  The bones are subjectively osteopenic. Multilevel disc degenerative changes noted. No new lytic or sclerotic osseous lesion is identified. Lower thoracic loss of vertebral body height is reidentified.  IMPRESSION: Diffusely thick walled bladder with mild proximal bilateral hydroureteronephrosis. Given the history of bladder cancer, this may indicate tumor involvement although cystitis or posttreatment change could appear similar.  Retroperitoneal and supravesical lymphadenopathy, with interval increase in size of the supravesical nodes which could be neoplastic or inflammatory.  New trace bilateral pleural effusions.   Electronically Signed    By: Conchita Paris M.D.   On: 02/27/2013 16:45   Dg Abd Acute W/chest  02/27/2013   CLINICAL DATA:  Abdominal distention. Unable to void. Bladder cancer.  EXAM: ACUTE ABDOMEN SERIES (ABDOMEN 2 VIEW & CHEST 1 VIEW)  COMPARISON:  Two-view chest x-ray 03/29/2012. CT of the abdomen and pelvis 12/31/2012.  FINDINGS: Heart is enlarged. Chronic interstitial coarsening is evident. No focal airspace disease is evident.  There is mild distention of the transverse colon. The transition point appears to be just beyond the splenic flexure. No free air is evident. The SI joints are fused bilaterally with findings suggesting ankylosing spondylitis.  IMPRESSION: 1. Mild distention of transverse and proximal colon with a transition point just beyond the and splenic flexure. This raises the possibility of diverticulitis. 2. Stable cardiomegaly without failure. 3. Fusion of the SI joints bilaterally and findings suggesting ankylosing spondylitis.   Electronically Signed   By: Lawrence Santiago M.D.   On: 02/27/2013 14:24    Scheduled Meds: . carvedilol  6.25 mg Oral BID WC  . finasteride  5 mg Oral q morning - 10a  .  piperacillin-tazobactam (ZOSYN)  IV  2.25 g Intravenous Q8H  . simvastatin  10 mg Oral QHS  . sodium chloride  3 mL Intravenous Q12H  . terazosin  5 mg Oral QHS  . Warfarin - Pharmacist Dosing Inpatient   Does not apply q1800   Continuous Infusions:   Principal Problem:   Acute encephalopathy Active Problems:   Chronic atrial fibrillation   Chronic systolic congestive heart failure   Bladder cancer   UTI (urinary tract infection)   Leukocytosis, unspecified   Anemia of chronic disease   Hydronephrosis   Uropathy, obstructive   Acute renal failure    Time spent: 35  min    Kimberle Stanfill  [facility] Pager [phone] If 7PM-7AM, please contact night-coverage at [email or web address], password [facility] 02/28/2013, 7:01 PM  LOS: 1 day

## 2013-02-28 NOTE — Progress Notes (Signed)
ANTICOAGULATION CONSULT NOTE - Follow Up Consult  Pharmacy Consult for warfarin Indication: atrial fibrillation  Allergies  Allergen Reactions  . Ace Inhibitors Other (See Comments)    Dizziness   . Codeine Nausea And Vomiting  . Sulfa Drugs Cross Reactors Nausea And Vomiting    Patient Measurements: Height: 5\' 9"  (175.3 cm) Weight: 168 lb 14 oz (76.6 kg) IBW/kg (Calculated) : 70.7 Heparin Dosing Weight:   Vital Signs: Temp: 98.2 F (36.8 C) (02/20 0516) Temp src: Oral (02/20 0516) BP: 119/58 mmHg (02/20 0516) Pulse Rate: 112 (02/20 0516)  Labs:  Recent Labs  02/27/13 1329 02/27/13 2000 02/27/13 2214 02/28/13 0405  HGB 10.7* 9.7*  --  10.0*  HCT 33.3* 29.7*  --  30.3*  PLT 165 183  --  168  APTT  --  40*  --   --   LABPROT 25.2*  --  28.6* 27.9*  INR 2.38*  --  2.81* 2.72*  CREATININE 6.39* 5.43*  --  4.52*    Estimated Creatinine Clearance: 10.6 ml/min (by C-G formula based on Cr of 4.52).   Assessment: 66 yoM admitted 2/19 with UTI and ARF. Patient is well known to pharmacy from antibiotic dosing. Pt on on chronic warfarin PTA for atrial fibrillation, followed by [place] anti-coagulation clinic. Warfarin held been held recently for urologic procedure, restarted 2/11. Home dose 2.5mg  daily except 5mg  every 3rd day. Last dose taken 2/18. INR on admission within therapeutic range at 2.38. Hgb on admission 10.7 (baseline appears ~12.5), plts low/stable at baseline. Pharmacy has been consulted to continue warfarin while inpatient.  Today;s INR = 2.72  CBC: Hgb = 10, platelets = 168  Goal of Therapy:  INR 2-3 Monitor platelets by anticoagulation protocol: Yes   Plan:   Coumadin 2.5mg  po tonight  Daily INR  Doreene Eland, PharmD, BCPS.   Pager: [phone]  02/28/2013,10:26 AM

## 2013-02-28 NOTE — Progress Notes (Signed)
INITIAL NUTRITION ASSESSMENT  DOCUMENTATION CODES Per approved criteria  -Not Applicable   INTERVENTION: Encouraged PO intake Will continue to monitor  NUTRITION DIAGNOSIS: Inadequate oral intake related to urine retention/constipation/UTI as evidenced by PO intake <75% for past 7 days.   Goal: Pt to meet >/= 90% of their estimated nutrition needs    Monitor:  Total protein/energy intake, labs, weights, I/O's, GI profile  Reason for Assessment: MST  80 y.o. male  Admitting Dx: Acute encephalopathy  ASSESSMENT: 80 year old male with a history of A. fib, hypertension, diastolic and systolic CHF, St. Jude's AVR on coumadin who recently underwent a large bladder tumor resection (multifocal papillary tumor) on 02/19/13 by Dr. [name] who now presented to [facility] ED after found to be more confused at home and initially noted three days prior to this admission. Daughter at bedside provided most of the details and explains she has also noted urinary retention since the foley was removed three days prior to this admission and and nearly no urine output since that time. This has also been associated with nausea and several episodes of non bloody vomiting, poor oral intake, malaise   -Pt's family reported decreased appetite for past week d/t nausea, urinary retention and constipation -Denied any unintentional wt loss. Pt's weight stays within 160-162 lbs -Wife reported good appetite prior to last week. Diet recall indicated pt consumes 2 meals/day, usually breakfast and dinner. Denied use of supplementations -Family reported appetite is returning with return of regular bowel movements and urine output -Pt consumed >75% of breakfast. Denied any nausea/vomiting post meals -Hx bladder tumor resection  Height: Ht Readings from Last 1 Encounters:  02/27/13 5\' 9"  (1.753 m)    Weight: Wt Readings from Last 1 Encounters:  02/28/13 168 lb 14 oz (76.6 kg)    Ideal Body Weight: 160 lbs  % Ideal  Body Weight: 105%  Wt Readings from Last 10 Encounters:  02/28/13 168 lb 14 oz (76.6 kg)  02/19/13 164 lb (74.39 kg)  02/19/13 164 lb (74.39 kg)  02/12/13 164 lb (74.39 kg)  08/30/12 167 lb (75.751 kg)  04/09/12 176 lb (79.833 kg)  03/01/12 169 lb 12.8 oz (77.021 kg)  12/22/10 172 lb (78.019 kg)  08/22/10 173 lb 3.2 oz (78.563 kg)    Usual Body Weight: 160-162 lbs  % Usual Body Weight: 104%  BMI:  Body mass index is 24.93 kg/(m^2).  Estimated Nutritional Needs: Kcal:1700-1900 Protein: 75-85 gram Fluid: >/=1900 ml/daily  Skin: WDL  Diet Order: General  EDUCATION NEEDS: -No education needs identified at this time   Intake/Output Summary (Last 24 hours) at 02/28/13 1016 Last data filed at 02/28/13 0932  Gross per 24 hour  Intake 1058.75 ml  Output   3425 ml  Net -2366.25 ml    Last BM: 2/19   Labs:   Recent Labs Lab 02/27/13 1329 02/27/13 2000 02/28/13 0405  NA 136* 137 140  K 5.2 4.7 4.2  CL 96 97 103  CO2 19 20 20   BUN 84* 79* 74*  CREATININE 6.39* 5.43* 4.52*  CALCIUM 7.8* 7.9* 7.6*  MG  --  2.0  --   PHOS  --  6.4*  --   GLUCOSE 140* 115* 84    CBG (last 3)   Recent Labs  02/28/13 0744 02/28/13 0815  GLUCAP 67* 110*    Scheduled Meds: . carvedilol  6.25 mg Oral BID WC  . finasteride  5 mg Oral q morning - 10a  . piperacillin-tazobactam (ZOSYN)  IV  2.25 g Intravenous Q8H  . simvastatin  10 mg Oral QHS  . sodium chloride  3 mL Intravenous Q12H  . terazosin  5 mg Oral QHS  . Warfarin - Pharmacist Dosing Inpatient   Does not apply q1800    Continuous Infusions:   Past Medical History  Diagnosis Date  . Chronic atrial fibrillation   . Chronic anticoagulation     a. afib/st. jude avr - goal 2.5-3.5   . BPH (benign prostatic hypertrophy)   . Hypertension   . History of kidney stones   . Gout   . Syncope   . History of skin cancer   . Bladder tumor     a. s/p resection 02/19/2013.  [name] Hx of scarlet fever   . Seizures 2014     evaluated by Dr. Jannifer Franklin at [facility] neurology -  no cause determined - refered back to cardiologist - recommended loop recorder to monitor rythm but pt refused - continues to have seizures per family - notes in EPIC  . Chronic combined systolic and diastolic CHF (congestive heart failure)     a. EF 45 to 50% per echo in 2011 following exacerbation    Past Surgical History  Procedure Laterality Date  . Aortic valve replacement  1987    #21 MM ST JUDE PLACED BY DR.CHARLES WILSON  . Cataract extraction    . Transurethral resection of prostate N/A 02/19/2013    Procedure: TRANSURETHRAL RESECTION OF THE PROSTATE /bladder WITH GYRUS INSTRUMENTS;  Surgeon: Irine Seal, MD;  Location: [facility];  Service: Urology;  Laterality: N/A;  . Cystoscopy N/A 02/19/2013    Procedure: CYSTOSCOPY;  Surgeon: Irine Seal, MD;  Location: [facility];  Service: Urology;  Laterality: N/A;    Atlee Abide MS RD LDN Clinical Dietitian [phone]

## 2013-02-28 NOTE — Evaluation (Signed)
Physical Therapy One Time Evaluation Patient Details Name: Eric Wheeler MRN: [id number] DOB: March 14, 1919 Today's Date: 02/28/2013 Time: 2130-8657 PT Time Calculation (min): 11 min  PT Assessment / Plan / Recommendation History of Present Illness  80 year old male with a history of A. fib, hypertension, diastolic and systolic CHF, [employer] AVR on coumadin who recently underwent a large bladder tumor resection (multifocal papillary tumor) on 02/19/13 by Dr. [name] who now presented to [facility] ED after found to be more confused at home and initially noted three days prior to this admission. Daughter at bedside provided most of the details and explains she has also noted urinary retention since the foley was removed three days prior to this admission and and nearly no urine output since that time. This has also been associated with nausea and several episodes of non bloody vomiting, poor oral intake, malaise. Pt denies chest pain or shortness of breath.   Clinical Impression  Patient evaluated by Physical Therapy with no further acute PT needs identified. All education has been completed and the patient has no further questions.  No follow-up Physial Therapy or equipment needs identified. Pt encouraged to ambulate with staff during hospitalization to maintain mobility.  Pt reports being at his baseline.  Pt's SPC was missing rubber tip so educated on importance of grip to assist with steadying pt if needed.  Rubber tip was available in rehab dept so applied to pt's SPC to improve pt safety.  PT is signing off. Thank you for this referral.      PT Assessment  Patent does not need any further PT services    Follow Up Recommendations  No PT follow up    Does the patient have the potential to tolerate intense rehabilitation      Barriers to Discharge        Equipment Recommendations       Recommendations for Other Services     Frequency      Precautions / Restrictions Precautions Precautions:  Fall   Pertinent Vitals/Pain Pt currently denies pain (however stated hx of gout in foot which is why he uses cane)     Mobility  Bed Mobility Overal bed mobility: Modified Independent Transfers Overall transfer level: Needs assistance Transfers: Sit to/from Stand Sit to Stand: Supervision Ambulation/Gait Ambulation/Gait assistance: Supervision Ambulation Distance (Feet): 400 Feet Assistive device: Straight cane Gait Pattern/deviations: Step-through pattern Gait velocity: decr General Gait Details: pt ambulates at slow pace for safety, states he is at his baseline, pt does not have rubber tip on cane, able to provide new rubber tip cane (had one in rehab dept)    Exercises     PT Diagnosis:    PT Problem List:   PT Treatment Interventions:       PT Goals(Current goals can be found in the care plan section) Acute Rehab PT Goals PT Goal Formulation: No goals set, d/c therapy  Visit Information  Last PT Received On: 02/28/13 Assistance Needed: +1 History of Present Illness: 80 year old male with a history of A. fib, hypertension, diastolic and systolic CHF, [employer] AVR on coumadin who recently underwent a large bladder tumor resection (multifocal papillary tumor) on 02/19/13 by Dr. [name] who now presented to [facility] ED after found to be more confused at home and initially noted three days prior to this admission. Daughter at bedside provided most of the details and explains she has also noted urinary retention since the foley was removed three days prior to this admission and and  nearly no urine output since that time. This has also been associated with nausea and several episodes of non bloody vomiting, poor oral intake, malaise. Pt denies chest pain or shortness of breath.        Prior [place] expects to be discharged to:: Private residence Living Arrangements: Spouse/significant other Type of Home: House Home Access: Stairs to enter Engineer, site of Steps: 1 Home Layout: One level [place]: Mountain - single point Prior Function Level of Independence: Independent with assistive device(s) Communication Communication: HOH    Cognition  Cognition Arousal/Alertness: Awake/alert Behavior During Therapy: WFL for tasks assessed/performed Overall Cognitive Status: Within Functional Limits for tasks assessed    Extremity/Trunk Assessment Lower Extremity Assessment Lower Extremity Assessment: Overall WFL for tasks assessed   Balance Balance Overall balance assessment:  (reports no hx of falls)  End of Session PT - End of Session Activity Tolerance: Patient tolerated treatment well Patient left: in bed;with call bell/phone within reach;with family/visitor present (sitting EOB with visitor/family)  GP     Landri Dorsainvil,KATHrine E 02/28/2013, 3:52 PM Carmelia Bake, PT, DPT 02/28/2013 Pager: [phone]

## 2013-02-28 NOTE — Progress Notes (Signed)
Patient ID: Eric Wheeler, male   DOB: September 06, 1919, 80 y.o.   MRN: [id number]    Subjective: [name] was admitted with retention and acute encephalopathy following TURBT for CIS last week.  He is now doing well with a foley and states he doesn't have a pain in the world.  His urine is clear and he is A/O x 3.   He had a good BM yesterday as well.  ROS: Negative except as above.   Objective: Vital signs in last 24 hours: Temp:  [97.7 F (36.5 C)-98.2 F (36.8 C)] 98.2 F (36.8 C) (02/20 0516) Pulse Rate:  [100-120] 112 (02/20 0516) Resp:  [20-30] 24 (02/20 0516) BP: (112-119)/(53-70) 119/58 mmHg (02/20 0516) SpO2:  [95 %-100 %] 96 % (02/20 0516) Weight:  [74.39 kg (164 lb)-76.6 kg (168 lb 14 oz)] 76.6 kg (168 lb 14 oz) (02/20 0516)  Intake/Output from previous day: 02/19 0701 - 02/20 0700 In: 818.8 [I.V.:768.8; IV Piggyback:50] Out: [id number] [Urine:3425] Intake/Output this shift:    General appearance: alert and no distress Resp: clear to auscultation bilaterally Cardio: sinus tachycardia.  GI: soft, non-tender; bowel sounds normal; no masses,  no organomegaly Neurologic: Alert and oriented X 3 Lab Results:   Recent Labs  02/27/13 2000 02/28/13 0405  WBC 14.8* 9.2  HGB 9.7* 10.0*  HCT 29.7* 30.3*  PLT 183 168   BMET  Recent Labs  02/27/13 2000 02/28/13 0405  NA 137 140  K 4.7 4.2  CL 97 103  CO2 20 20  GLUCOSE 115* 84  BUN 79* 74*  CREATININE 5.43* 4.52*  CALCIUM 7.9* 7.6*   PT/INR  Recent Labs  02/27/13 2214 02/28/13 0405  LABPROT 28.6* 27.9*  INR 2.81* 2.72*   ABG No results found for this basename: PHART, PCO2, PO2, HCO3,  in the last 72 hours  Studies/Results: Ct Abdomen Pelvis Wo Contrast  02/27/2013   CLINICAL DATA:  Anuria, history of bladder cancer with removal of urinary catheter 4 days ago.  EXAM: CT ABDOMEN AND PELVIS WITHOUT CONTRAST  TECHNIQUE: Multidetector CT imaging of the abdomen and pelvis was performed following the standard  protocol without intravenous contrast.  COMPARISON:  DG ABD ACUTE W/CHEST dated 02/27/2013; CT ABD-PEL WO/W CM dated 12/31/2012  FINDINGS: Trace pleural effusions are noted.  Unenhanced CT was performed per clinician order. Lack of IV contrast limits sensitivity and specificity, especially for evaluation of abdominal/pelvic solid viscera. 1 cm lateral segment left hepatic lobe cyst is reidentified. Too small to characterize 5 mm lateral segment left hepatic lobe hypodense lesion image 9 is stable. Gallstones are noted within the decompressed otherwise normal-appearing gallbladder. Motion artifact degrades imaging at multiple levels. Adrenal glands, spleen, and pancreas are grossly unremarkable.  There has been interval development of mild hydroureteronephrosis to the level of the bladder which is partly decompressed but demonstrates partly visualized wall thickening of at least 1 cm image 67. A Foley catheter is in place. At the right bladder apex, there is a 0.7 cm dominant lymph node as well as other similar-appearing enlarged supravesical lymph nodes for example image 57. 0.8 cm periaortic node identified image 40. Pericaval 0.7 cm lymph node identified image 51, not significantly changed. The supravesical nodes are increase in size since previously but the dominant periaortic node is stable in size. There is trace fluid tracking along the ureters tracking to the pelvis. Marked enlargement of the prostate is again noted. Bilateral small inguinal nodes are stable.  No bowel wall thickening or focal  segmental dilatation. Moderate atheromatous aortic calcification without aneurysm.  The bones are subjectively osteopenic. Multilevel disc degenerative changes noted. No new lytic or sclerotic osseous lesion is identified. Lower thoracic loss of vertebral body height is reidentified.  IMPRESSION: Diffusely thick walled bladder with mild proximal bilateral hydroureteronephrosis. Given the history of bladder cancer, this  may indicate tumor involvement although cystitis or posttreatment change could appear similar.  Retroperitoneal and supravesical lymphadenopathy, with interval increase in size of the supravesical nodes which could be neoplastic or inflammatory.  New trace bilateral pleural effusions.   Electronically Signed   By: Conchita Paris M.D.   On: 02/27/2013 16:45   Dg Abd Acute W/chest  02/27/2013   CLINICAL DATA:  Abdominal distention. Unable to void. Bladder cancer.  EXAM: ACUTE ABDOMEN SERIES (ABDOMEN 2 VIEW & CHEST 1 VIEW)  COMPARISON:  Two-view chest x-ray 03/29/2012. CT of the abdomen and pelvis 12/31/2012.  FINDINGS: Heart is enlarged. Chronic interstitial coarsening is evident. No focal airspace disease is evident.  There is mild distention of the transverse colon. The transition point appears to be just beyond the splenic flexure. No free air is evident. The SI joints are fused bilaterally with findings suggesting ankylosing spondylitis.  IMPRESSION: 1. Mild distention of transverse and proximal colon with a transition point just beyond the and splenic flexure. This raises the possibility of diverticulitis. 2. Stable cardiomegaly without failure. 3. Fusion of the SI joints bilaterally and findings suggesting ankylosing spondylitis.   Electronically Signed   By: Lawrence Santiago M.D.   On: 02/27/2013 14:24    Anti-infectives: Anti-infectives   Start     Dose/Rate Route Frequency Ordered Stop   02/28/13 0000  piperacillin-tazobactam (ZOSYN) IVPB 2.25 g     2.25 g 100 mL/hr over 30 Minutes Intravenous Every 8 hours 02/27/13 2135     02/27/13 1445  piperacillin-tazobactam (ZOSYN) IVPB 3.375 g     3.375 g 100 mL/hr over 30 Minutes Intravenous  Once 02/27/13 1441 02/27/13 1615   02/27/13 1430  cefTRIAXone (ROCEPHIN) 1 g in dextrose 5 % 50 mL IVPB  Status:  Discontinued     1 g 100 mL/hr over 30 Minutes Intravenous  Once 02/27/13 1427 02/27/13 1441      Current Facility-Administered Medications   Medication Dose Route Frequency Provider Last Rate Last Dose  . acetaminophen (TYLENOL) tablet 650 mg  650 mg Oral Q6H PRN Theodis Blaze, MD       Or  . acetaminophen (TYLENOL) suppository 650 mg  650 mg Rectal Q6H PRN Theodis Blaze, MD      . carvedilol (COREG) tablet 6.25 mg  6.25 mg Oral BID WC Theodis Blaze, MD      . finasteride (PROSCAR) tablet 5 mg  5 mg Oral q morning - 10a Theodis Blaze, MD      . morphine 2 MG/ML injection 1 mg  1 mg Intravenous Q4H PRN Theodis Blaze, MD      . ondansetron [facility]) tablet 4 mg  4 mg Oral Q6H PRN Theodis Blaze, MD       Or  . ondansetron [facility]) injection 4 mg  4 mg Intravenous Q6H PRN Theodis Blaze, MD      . phenazopyridine (PYRIDIUM) tablet 100 mg  100 mg Oral TID PRN Theodis Blaze, MD      . piperacillin-tazobactam (ZOSYN) IVPB 2.25 g  2.25 g Intravenous Q8H Mosetta Pigeon, RPH   2.25 g at 02/27/13 2356  . simvastatin (  ZOCOR) tablet 10 mg  10 mg Oral QHS Theodis Blaze, MD   10 mg at 02/27/13 2134  . sodium chloride 0.9 % injection 3 mL  3 mL Intravenous Q12H Theodis Blaze, MD   3 mL at 02/27/13 2200  . terazosin (HYTRIN) capsule 5 mg  5 mg Oral QHS Theodis Blaze, MD   5 mg at 02/27/13 2134  . traMADol (ULTRAM) tablet 50 mg  50 mg Oral Q6H PRN Theodis Blaze, MD      . Warfarin - Pharmacist Dosing Inpatient   Does not apply q1800 Mosetta Pigeon, [facility]        Assessment: He is doing well on current therapy.   Plan: He is ok to begin a diet. He will need the foley for a week and should see me in the office for removal next Thursday.     LOS: 1 day    Laini Urick J 02/28/2013

## 2013-02-28 NOTE — Plan of Care (Signed)
Problem: Phase I Progression Outcomes Goal: OOB as tolerated unless otherwise ordered Outcome: Completed/Met Date Met:  02/28/13 Patient worked with physical therapy today.

## 2013-03-01 LAB — BASIC METABOLIC PANEL
BUN: 59 mg/dL — AB (ref 6–23)
CHLORIDE: 111 meq/L (ref 96–112)
CO2: 24 mEq/L (ref 19–32)
Calcium: 7.7 mg/dL — ABNORMAL LOW (ref 8.4–10.5)
Creatinine, Ser: 2.8 mg/dL — ABNORMAL HIGH (ref 0.50–1.35)
GFR calc Af Amer: 22 mL/min — ABNORMAL LOW (ref >90)
GFR, EST NON AFRICAN AMERICAN: 19 mL/min — AB (ref >90)
Glucose, Bld: 115 mg/dL — ABNORMAL HIGH (ref 70–99)
POTASSIUM: 4.1 meq/L (ref 3.7–5.3)
SODIUM: 147 meq/L (ref 137–147)

## 2013-03-01 LAB — GLUCOSE, CAPILLARY: Glucose-Capillary: 110 mg/dL — ABNORMAL HIGH (ref 70–99)

## 2013-03-01 LAB — PROTIME-INR
INR: 2.81 — ABNORMAL HIGH (ref 0.00–1.49)
Prothrombin Time: 28.6 seconds — ABNORMAL HIGH (ref 11.6–15.2)

## 2013-03-01 LAB — PROCALCITONIN

## 2013-03-01 MED ORDER — WARFARIN SODIUM 1 MG PO TABS
1.0000 mg | ORAL_TABLET | Freq: Once | ORAL | Status: AC
  Administered 2013-03-01: 1 mg via ORAL
  Filled 2013-03-01 (×2): qty 1

## 2013-03-01 NOTE — Progress Notes (Signed)
[facility] PROGRESS NOTE  Eric Wheeler [id number] DOB: Nov 10, 1919 DOA: 02/27/2013 PCP: [name], NP  Assessment/Plan: Acute encephalopathy  - secondary to acute infection, UTI and urinary retention form obstructive uropathy  - foley inserted; monitor urine output  - treat UTI with zosyn  - PT /OT evaluation once pt able to participate  - his encephalopathy is resolved.  Active Problems:  UTI (urinary tract infection)  - treat with zosyn, per pharmacy protocol  - follow up urine culture results  Acute renal failure/ Obstructive uropathy/ bilateral hydroureteronephrosis  - appreciate urology consult and their recommendations  - foley placed  - continue to monitor renal function, urine output  - hold lasix  Chronic atrial fibrillation  - coumadin per pharmacy  - rate controlled with coreg  - monitor for bleeds  Chronic systolic congestive heart failure  - last 2 D ECHO in 2011 showed EF in 45% range  - lasix held for acute renal failure.   Bladder cancer  - per urology  Leukocytosis, unspecified  - likely due to UTI  - treat with zosyn as noted above  Anemia of chronic disease  - secondary to history of bladder cancer  - hemoglobin 10.6 on this admission  - no indications for transfusion   Code Status: Full  Family Communication: Pt and daughter at bedside  Disposition Plan: Admit to telemetry bed      Consultants:  urology  Procedures:  none  Antibiotics:  Zosyn 2/19  HPI/Subjective: Comfortable.   Objective: Filed Vitals:   03/01/13 1314  BP: 128/73  Pulse: 119  Temp: 98.4 F (36.9 C)  Resp: 20    Intake/Output Summary (Last 24 hours) at 03/01/13 1914 Last data filed at 03/01/13 1622  Gross per 24 hour  Intake    580 ml  Output   1025 ml  Net   -445 ml   Filed Weights   02/27/13 1902 02/28/13 0516 03/01/13 0605  Weight: 74.39 kg (164 lb) 76.6 kg (168 lb 14 oz) 75 kg (165 lb 5.5 oz)    Exam:   General:  Alert  afebrile comforatble  Cardiovascular: s1s2  Respiratory: ctab  Abdomen: soft NT ND BS+  Musculoskeletal: no pedal edema.  Data Reviewed: Basic Metabolic Panel:  Recent Labs Lab 02/27/13 1329 02/27/13 2000 02/28/13 0405 03/01/13 0412  NA 136* 137 140 147  K 5.2 4.7 4.2 4.1  CL 96 97 103 111  CO2 19 20 20 24   GLUCOSE 140* 115* 84 115*  BUN 84* 79* 74* 59*  CREATININE 6.39* 5.43* 4.52* 2.80*  CALCIUM 7.8* 7.9* 7.6* 7.7*  MG  --  2.0  --   --   PHOS  --  6.4*  --   --    Liver Function Tests:  Recent Labs Lab 02/27/13 1329 02/27/13 2000 02/28/13 0405  AST 13 11 9   ALT 13 12 11   ALKPHOS 85 79 63  BILITOT 0.5 0.4 0.4  PROT 6.7 6.5 5.9*  ALBUMIN 2.8* 2.7* 2.5*   No results found for this basename: LIPASE, AMYLASE,  in the last 168 hours No results found for this basename: AMMONIA,  in the last 168 hours CBC:  Recent Labs Lab 02/27/13 1329 02/27/13 2000 02/28/13 0405  WBC 17.4* 14.8* 9.2  NEUTROABS 15.4* 12.9*  --   HGB 10.7* 9.7* 10.0*  HCT 33.3* 29.7* 30.3*  MCV 91.0 90.0 89.9  PLT 165 183 168   Cardiac Enzymes: No results found for this basename: CKTOTAL, CKMB,  CKMBINDEX, TROPONINI,  in the last 168 hours BNP (last 3 results)  Recent Labs  03/29/12 1615  PROBNP 2069.0*   CBG:  Recent Labs Lab 02/28/13 0744 02/28/13 0815 03/01/13 0726  GLUCAP 67* 110* 110*    Recent Results (from the past 240 hour(s))  URINE CULTURE     Status: None   Collection Time    02/27/13  2:37 PM      Result Value Ref Range Status   Specimen Description URINE, CATHETERIZED   Final   Special Requests NONE   Final   Culture  Setup Time     Final   Value: 02/27/2013 16:36     Performed at [place] PENDING   Incomplete   Culture     Final   Value: Culture reincubated for better growth     Performed at [facility]   Report Status PENDING   Incomplete  URINE CULTURE     Status: None   Collection Time    02/27/13 11:36 PM       Result Value Ref Range Status   Specimen Description URINE, CATHETERIZED   Final   Special Requests NONE   Final   Culture  Setup Time     Final   Value: 02/28/2013 05:57     Performed at [place]     Final   Value: 40,000 COLONIES/ML     Performed at [facility]   Culture     Final   Value: STAPHYLOCOCCUS SPECIES (COAGULASE NEGATIVE)     Note: RIFAMPIN AND GENTAMICIN SHOULD NOT BE USED AS SINGLE DRUGS FOR TREATMENT OF STAPH INFECTIONS.     Performed at [facility]   Report Status PENDING   Incomplete     Studies: No results found.  Scheduled Meds: . carvedilol  6.25 mg Oral BID WC  . finasteride  5 mg Oral q morning - 10a  . piperacillin-tazobactam (ZOSYN)  IV  2.25 g Intravenous Q8H  . simvastatin  10 mg Oral QHS  . sodium chloride  3 mL Intravenous Q12H  . terazosin  5 mg Oral QHS  . warfarin  1 mg Oral ONCE-1800  . Warfarin - Pharmacist Dosing Inpatient   Does not apply q1800   Continuous Infusions:   Principal Problem:   Acute encephalopathy Active Problems:   Chronic atrial fibrillation   Chronic systolic congestive heart failure   Bladder cancer   UTI (urinary tract infection)   Leukocytosis, unspecified   Anemia of chronic disease   Hydronephrosis   Uropathy, obstructive   Acute renal failure    Time spent: 35  min    [name]  [facility] Pager [phone] If 7PM-7AM, please contact night-coverage at [email or web address], password [facility] 03/01/2013, 7:14 PM  LOS: 2 days

## 2013-03-01 NOTE — Progress Notes (Signed)
Subjective: Patient reports no new urologic complaints  Objective: Vital signs in last 24 hours: Temp:  [97.8 F (36.6 C)-98.2 F (36.8 C)] 97.8 F (36.6 C) (02/21 0605) Pulse Rate:  [112-122] 122 (02/21 0605) Resp:  [20] 20 (02/21 0605) BP: (111-131)/(66-75) 131/75 mmHg (02/21 0605) SpO2:  [96 %-98 %] 97 % (02/21 0605) Weight:  [75 kg (165 lb 5.5 oz)] 75 kg (165 lb 5.5 oz) (02/21 0605)  Intake/Output from previous day: 02/20 0701 - 02/21 0700 In: 820 [P.O.:720; IV Piggyback:100] Out: 1975 [Urine:1975] Intake/Output this shift: Total I/O In: 120 [P.O.:120] Out: -   Physical Exam:  His catheter is draining although his urine has become somewhat bloody. No clots.  Lab Results:  Recent Labs  02/27/13 1329 02/27/13 2000 02/28/13 0405  HGB 10.7* 9.7* 10.0*  HCT 33.3* 29.7* 30.3*   BMET  Recent Labs  02/28/13 0405 03/01/13 0412  NA 140 147  K 4.2 4.1  CL 103 111  CO2 20 24  GLUCOSE 84 115*  BUN 74* 59*  CREATININE 4.52* 2.80*  CALCIUM 7.6* 7.7*    Recent Labs  02/27/13 2214 02/28/13 0405 03/01/13 0412  INR 2.81* 2.72* 2.81*   No results found for this basename: LABURIN,  in the last 72 hours Results for orders placed during the hospital encounter of 02/27/13  URINE CULTURE     Status: None   Collection Time    02/27/13  2:37 PM      Result Value Ref Range Status   Specimen Description URINE, CATHETERIZED   Final   Special Requests NONE   Final   Culture  Setup Time     Final   Value: 02/27/2013 16:36     Performed at [place] PENDING   Incomplete   Culture     Final   Value: Culture reincubated for better growth     Performed at [facility]   Report Status PENDING   Incomplete    Studies/Results: Ct Abdomen Pelvis Wo Contrast  02/27/2013   CLINICAL DATA:  Anuria, history of bladder cancer with removal of urinary catheter 4 days ago.  EXAM: CT ABDOMEN AND PELVIS WITHOUT CONTRAST  TECHNIQUE: Multidetector  CT imaging of the abdomen and pelvis was performed following the standard protocol without intravenous contrast.  COMPARISON:  DG ABD ACUTE W/CHEST dated 02/27/2013; CT ABD-PEL WO/W CM dated 12/31/2012  FINDINGS: Trace pleural effusions are noted.  Unenhanced CT was performed per clinician order. Lack of IV contrast limits sensitivity and specificity, especially for evaluation of abdominal/pelvic solid viscera. 1 cm lateral segment left hepatic lobe cyst is reidentified. Too small to characterize 5 mm lateral segment left hepatic lobe hypodense lesion image 9 is stable. Gallstones are noted within the decompressed otherwise normal-appearing gallbladder. Motion artifact degrades imaging at multiple levels. Adrenal glands, spleen, and pancreas are grossly unremarkable.  There has been interval development of mild hydroureteronephrosis to the level of the bladder which is partly decompressed but demonstrates partly visualized wall thickening of at least 1 cm image 67. A Foley catheter is in place. At the right bladder apex, there is a 0.7 cm dominant lymph node as well as other similar-appearing enlarged supravesical lymph nodes for example image 57. 0.8 cm periaortic node identified image 40. Pericaval 0.7 cm lymph node identified image 51, not significantly changed. The supravesical nodes are increase in size since previously but the dominant periaortic node is stable in size. There is trace fluid tracking along  the ureters tracking to the pelvis. Marked enlargement of the prostate is again noted. Bilateral small inguinal nodes are stable.  No bowel wall thickening or focal segmental dilatation. Moderate atheromatous aortic calcification without aneurysm.  The bones are subjectively osteopenic. Multilevel disc degenerative changes noted. No new lytic or sclerotic osseous lesion is identified. Lower thoracic loss of vertebral body height is reidentified.  IMPRESSION: Diffusely thick walled bladder with mild proximal  bilateral hydroureteronephrosis. Given the history of bladder cancer, this may indicate tumor involvement although cystitis or posttreatment change could appear similar.  Retroperitoneal and supravesical lymphadenopathy, with interval increase in size of the supravesical nodes which could be neoplastic or inflammatory.  New trace bilateral pleural effusions.   Electronically Signed   By: Conchita Paris M.D.   On: 02/27/2013 16:45   Dg Abd Acute W/chest  02/27/2013   CLINICAL DATA:  Abdominal distention. Unable to void. Bladder cancer.  EXAM: ACUTE ABDOMEN SERIES (ABDOMEN 2 VIEW & CHEST 1 VIEW)  COMPARISON:  Two-view chest x-ray 03/29/2012. CT of the abdomen and pelvis 12/31/2012.  FINDINGS: Heart is enlarged. Chronic interstitial coarsening is evident. No focal airspace disease is evident.  There is mild distention of the transverse colon. The transition point appears to be just beyond the splenic flexure. No free air is evident. The SI joints are fused bilaterally with findings suggesting ankylosing spondylitis.  IMPRESSION: 1. Mild distention of transverse and proximal colon with a transition point just beyond the and splenic flexure. This raises the possibility of diverticulitis. 2. Stable cardiomegaly without failure. 3. Fusion of the SI joints bilaterally and findings suggesting ankylosing spondylitis.   Electronically Signed   By: Lawrence Santiago M.D.   On: 02/27/2013 14:24    Assessment/Plan: He appears to be doing well clinically. His Foley catheter remains indwelling and is draining although his urine has become slightly bloody. We discussed the need to increase by mouth fluid intake.  Continue Foley catheter  Irrigate Foley when necessary to maintain patency.  By mouth fluid intake encouraged.   LOS: 2 days   Raisa Ditto C 03/01/2013, 8:07 AM

## 2013-03-01 NOTE — Progress Notes (Signed)
ANTICOAGULATION CONSULT NOTE - Follow Up Consult  Pharmacy Consult for warfarin Indication: atrial fibrillation  Allergies  Allergen Reactions  . Ace Inhibitors Other (See Comments)    Dizziness   . Codeine Nausea And Vomiting  . Sulfa Drugs Cross Reactors Nausea And Vomiting    Patient Measurements: Height: 5\' 9"  (175.3 cm) Weight: 165 lb 5.5 oz (75 kg) IBW/kg (Calculated) : 70.7  Vital Signs: Temp: 97.8 F (36.6 C) (02/21 0605) Temp src: Oral (02/21 0605) BP: 131/75 mmHg (02/21 0605) Pulse Rate: 122 (02/21 0605)  Labs:  Recent Labs  02/27/13 1329 02/27/13 2000 02/27/13 2214 02/28/13 0405 03/01/13 0412  HGB 10.7* 9.7*  --  10.0*  --   HCT 33.3* 29.7*  --  30.3*  --   PLT 165 183  --  168  --   APTT  --  40*  --   --   --   LABPROT 25.2*  --  28.6* 27.9* 28.6*  INR 2.38*  --  2.81* 2.72* 2.81*  CREATININE 6.39* 5.43*  --  4.52* 2.80*    Estimated Creatinine Clearance: 17.2 ml/min (by C-G formula based on Cr of 2.8).   Assessment: 66 yoM admitted 2/19 with UTI and ARF. Patient is well known to pharmacy from antibiotic dosing. Pt on on chronic warfarin PTA for atrial fibrillation, followed by [place] anti-coagulation clinic. Warfarin held been held recently for urologic procedure, restarted 2/11. Home dose 2.5mg  daily except 5mg  every 3rd day. Last dose taken 2/18. INR on admission within therapeutic range at 2.38. Hgb on admission 10.7 (baseline appears ~12.5), plts low/stable at baseline. Pharmacy has been consulted to continue warfarin while inpatient.  INR therapeutic and stable  Per Urology, urine has become slightly bloody, no clots. CBC low but stable yesterday  Goal of Therapy:  INR 2-3 Monitor platelets by anticoagulation protocol: Yes   Plan:   Decrease warfarin to 1mg  po tonight d/t bloody urine  Daily INR, CBC in am  Peggyann Juba, PharmD, BCPS Pager: [phone] 03/01/2013,8:39 AM

## 2013-03-02 DIAGNOSIS — D638 Anemia in other chronic diseases classified elsewhere: Secondary | ICD-10-CM

## 2013-03-02 LAB — URINE CULTURE

## 2013-03-02 LAB — BASIC METABOLIC PANEL
BUN: 61 mg/dL — ABNORMAL HIGH (ref 6–23)
CHLORIDE: 110 meq/L (ref 96–112)
CO2: 25 mEq/L (ref 19–32)
Calcium: 8.1 mg/dL — ABNORMAL LOW (ref 8.4–10.5)
Creatinine, Ser: 2.95 mg/dL — ABNORMAL HIGH (ref 0.50–1.35)
GFR, EST AFRICAN AMERICAN: 20 mL/min — AB (ref >90)
GFR, EST NON AFRICAN AMERICAN: 17 mL/min — AB (ref >90)
Glucose, Bld: 149 mg/dL — ABNORMAL HIGH (ref 70–99)
POTASSIUM: 4.3 meq/L (ref 3.7–5.3)
SODIUM: 149 meq/L — AB (ref 137–147)

## 2013-03-02 LAB — CBC
HEMATOCRIT: 33.1 % — AB (ref 39.0–52.0)
Hemoglobin: 10.3 g/dL — ABNORMAL LOW (ref 13.0–17.0)
MCH: 28.9 pg (ref 26.0–34.0)
MCHC: 31.1 g/dL (ref 30.0–36.0)
MCV: 93 fL (ref 78.0–100.0)
Platelets: 187 10*3/uL (ref 150–400)
RBC: 3.56 MIL/uL — ABNORMAL LOW (ref 4.22–5.81)
RDW: 15.2 % (ref 11.5–15.5)
WBC: 15 10*3/uL — AB (ref 4.0–10.5)

## 2013-03-02 LAB — GLUCOSE, CAPILLARY: GLUCOSE-CAPILLARY: 125 mg/dL — AB (ref 70–99)

## 2013-03-02 LAB — PROTIME-INR
INR: 3.15 — AB (ref 0.00–1.49)
PROTHROMBIN TIME: 31.2 s — AB (ref 11.6–15.2)

## 2013-03-02 NOTE — Progress Notes (Signed)
ANTICOAGULATION CONSULT NOTE - Follow Up Consult  Pharmacy Consult for warfarin Indication: atrial fibrillation  Allergies  Allergen Reactions  . Ace Inhibitors Other (See Comments)    Dizziness   . Codeine Nausea And Vomiting  . Sulfa Drugs Cross Reactors Nausea And Vomiting    Patient Measurements: Height: 5\' 9"  (175.3 cm) Weight: 166 lb 0.1 oz (75.3 kg) IBW/kg (Calculated) : 70.7  Vital Signs: Temp: 97.5 F (36.4 C) (02/22 0550) Temp src: Oral (02/22 0550) BP: 141/82 mmHg (02/22 0550) Pulse Rate: 118 (02/22 0550)  Labs:  Recent Labs  02/27/13 2000  02/28/13 0405 03/01/13 0412 03/02/13 0412  HGB 9.7*  --  10.0*  --  10.3*  HCT 29.7*  --  30.3*  --  33.1*  PLT 183  --  168  --  187  APTT 40*  --   --   --   --   LABPROT  --   < > 27.9* 28.6* 31.2*  INR  --   < > 2.72* 2.81* 3.15*  CREATININE 5.43*  --  4.52* 2.80* 2.95*  < > = values in this interval not displayed.  Estimated Creatinine Clearance: 16.3 ml/min (by C-G formula based on Cr of 2.95).   Assessment: 63 yoM admitted 2/19 with UTI and ARF. Patient is well known to pharmacy from antibiotic dosing. Pt on on chronic warfarin PTA for atrial fibrillation, followed by [place] anti-coagulation clinic. Warfarin held been held recently for urologic procedure, restarted 2/11. Home dose 2.5mg  daily except 5mg  every 3rd day. Last dose taken 2/18. INR on admission within therapeutic range at 2.38. Hgb on admission 10.7 (baseline appears ~12.5), plts low/stable at baseline. Pharmacy has been consulted to continue warfarin while inpatient.  INR now supratherapeutic  CBC low but stable  RN reports worsening hematuria today  Goal of Therapy:  INR 2-3 Monitor platelets by anticoagulation protocol: Yes   Plan:   Hold warfarin today  Daily INR, CBC in am  Peggyann Juba, PharmD, BCPS Pager: [phone] 03/02/2013,9:43 AM

## 2013-03-02 NOTE — Progress Notes (Signed)
Foley catheter draining bloody urine. Notified MD.  Will continue to monitor.

## 2013-03-02 NOTE — Progress Notes (Signed)
[facility] PROGRESS NOTE  Eric Wheeler [id number] DOB: 1919/03/21 DOA: 02/27/2013 PCP: [name], NP  Assessment/Plan: Acute encephalopathy  - secondary to acute infection, UTI and urinary retention form obstructive uropathy  - foley inserted; monitorING urine output  - treatING UTI with zosyn  - PT /OT evaluation RECOMMENDS no PT follow up.  - his encephalopathy is resolved.  Active Problems:  UTI (urinary tract infection)  - treat with zosyn, per pharmacy protocol  - follow up urine culture results  Acute renal failure/ Obstructive uropathy/ bilateral hydroureteronephrosis  - appreciate urology consult and their recommendations  - foley placed  - continue to monitor renal function, urine output  - holding  Lasix till renal function improves.   Chronic atrial fibrillation  - coumadin per pharmacy . INR supratherapeutic.  - rate suboptimal controlled with coreg  - hold coumadin tonight.  Chronic systolic congestive heart failure  - last 2 D ECHO in 2011 showed EF in 45% range  - lasix held for acute renal failure.   Bladder cancer  - per urology  Leukocytosis, unspecified  - likely due to UTI  - treat with zosyn as noted above  Anemia of chronic disease  - secondary to history of bladder cancer  - hemoglobin 10.6 on this admission  - no indications for transfusion   Code Status: Full  Family Communication: Pt and daughter at bedside  Disposition Plan: Admit to telemetry bed      Consultants:  urology  Procedures:  none  Antibiotics:  Zosyn 2/19  HPI/Subjective: Comfortable. Reports back pain. Ambulate patient.   Objective: Filed Vitals:   03/02/13 1443  BP: 141/81  Pulse: 101  Temp: 97.6 F (36.4 C)  Resp: 18    Intake/Output Summary (Last 24 hours) at 03/02/13 1612 Last data filed at 03/02/13 1300  Gross per 24 hour  Intake    350 ml  Output    601 ml  Net   -251 ml   Filed Weights   02/28/13 0516 03/01/13 0605  03/02/13 0550  Weight: 76.6 kg (168 lb 14 oz) 75 kg (165 lb 5.5 oz) 75.3 kg (166 lb 0.1 oz)    Exam:   General:  Alert afebrile comforatble  Cardiovascular: s1s2  Respiratory: ctab  Abdomen: soft NT ND BS+  Musculoskeletal: no pedal edema.  Data Reviewed: Basic Metabolic Panel:  Recent Labs Lab 02/27/13 1329 02/27/13 2000 02/28/13 0405 03/01/13 0412 03/02/13 0412  NA 136* 137 140 147 149*  K 5.2 4.7 4.2 4.1 4.3  CL 96 97 103 111 110  CO2 19 20 20 24 25   GLUCOSE 140* 115* 84 115* 149*  BUN 84* 79* 74* 59* 61*  CREATININE 6.39* 5.43* 4.52* 2.80* 2.95*  CALCIUM 7.8* 7.9* 7.6* 7.7* 8.1*  MG  --  2.0  --   --   --   PHOS  --  6.4*  --   --   --    Liver Function Tests:  Recent Labs Lab 02/27/13 1329 02/27/13 2000 02/28/13 0405  AST 13 11 9   ALT 13 12 11   ALKPHOS 85 79 63  BILITOT 0.5 0.4 0.4  PROT 6.7 6.5 5.9*  ALBUMIN 2.8* 2.7* 2.5*   No results found for this basename: LIPASE, AMYLASE,  in the last 168 hours No results found for this basename: AMMONIA,  in the last 168 hours CBC:  Recent Labs Lab 02/27/13 1329 02/27/13 2000 02/28/13 0405 03/02/13 0412  WBC 17.4* 14.8* 9.2 15.0*  NEUTROABS  15.4* 12.9*  --   --   HGB 10.7* 9.7* 10.0* 10.3*  HCT 33.3* 29.7* 30.3* 33.1*  MCV 91.0 90.0 89.9 93.0  PLT 165 183 168 187   Cardiac Enzymes: No results found for this basename: CKTOTAL, CKMB, CKMBINDEX, TROPONINI,  in the last 168 hours BNP (last 3 results)  Recent Labs  03/29/12 1615  PROBNP 2069.0*   CBG:  Recent Labs Lab 02/28/13 0744 02/28/13 0815 03/01/13 0726 03/02/13 0715  GLUCAP 67* 110* 110* 125*    Recent Results (from the past 240 hour(s))  URINE CULTURE     Status: None   Collection Time    02/27/13  2:37 PM      Result Value Ref Range Status   Specimen Description URINE, CATHETERIZED   Final   Special Requests NONE   Final   Culture  Setup Time     Final   Value: 02/27/2013 16:36     Performed at [place]     Final   Value: 95,000 COLONIES/ML     Performed at [facility]   Culture     Final   Value: [place]     Performed at [facility]   Report Status PENDING   Incomplete  URINE CULTURE     Status: None   Collection Time    02/27/13 11:36 PM      Result Value Ref Range Status   Specimen Description URINE, CATHETERIZED   Final   Special Requests NONE   Final   Culture  Setup Time     Final   Value: 02/28/2013 05:57     Performed at [facility]     Final   Value: 40,000 COLONIES/ML     Performed at [facility]   Culture     Final   Value: STAPHYLOCOCCUS SPECIES (COAGULASE NEGATIVE)     Note: RIFAMPIN AND GENTAMICIN SHOULD NOT BE USED AS SINGLE DRUGS FOR TREATMENT OF STAPH INFECTIONS.     Performed at [facility]   Report Status 03/02/2013 FINAL   Final   Organism ID, Bacteria STAPHYLOCOCCUS SPECIES (COAGULASE NEGATIVE)   Final     Studies: No results found.  Scheduled Meds: . carvedilol  6.25 mg Oral BID WC  . finasteride  5 mg Oral q morning - 10a  . piperacillin-tazobactam (ZOSYN)  IV  2.25 g Intravenous Q8H  . simvastatin  10 mg Oral QHS  . sodium chloride  3 mL Intravenous Q12H  . terazosin  5 mg Oral QHS  . Warfarin - Pharmacist Dosing Inpatient   Does not apply q1800   Continuous Infusions:   Principal Problem:   Acute encephalopathy Active Problems:   Chronic atrial fibrillation   Chronic systolic congestive heart failure   Bladder cancer   UTI (urinary tract infection)   Leukocytosis, unspecified   Anemia of chronic disease   Hydronephrosis   Uropathy, obstructive   Acute renal failure    Time spent: 35  min    [name]  [facility] Pager [phone] If 7PM-7AM, please contact night-coverage at [email or web address], password [facility] 03/02/2013, 4:12 PM  LOS: 3 days

## 2013-03-02 NOTE — Progress Notes (Signed)
ANTIBIOTIC CONSULT NOTE - FOLLOW UP  Pharmacy Consult for Zosyn Indication: UTI  Allergies  Allergen Reactions  . Ace Inhibitors Other (See Comments)    Dizziness   . Codeine Nausea And Vomiting  . Sulfa Drugs Cross Reactors Nausea And Vomiting    Patient Measurements: Height: 5\' 9"  (175.3 cm) Weight: 166 lb 0.1 oz (75.3 kg) IBW/kg (Calculated) : 70.7  Vital Signs: Temp: 97.5 F (36.4 C) (02/22 0550) Temp src: Oral (02/22 0550) BP: 141/82 mmHg (02/22 0550) Pulse Rate: 118 (02/22 0550) Intake/Output from previous day: 02/21 0701 - 02/22 0700 In: 580 [P.O.:480; IV Piggyback:100] Out: 650 [Urine:650] Intake/Output from this shift: Total I/O In: 180 [P.O.:180] Out: -   Labs:  Recent Labs  02/27/13 2000 02/28/13 0405 03/01/13 0412 03/02/13 0412  WBC 14.8* 9.2  --  15.0*  HGB 9.7* 10.0*  --  10.3*  PLT 183 168  --  187  CREATININE 5.43* 4.52* 2.80* 2.95*   Estimated Creatinine Clearance: 16.3 ml/min (by C-G formula based on Cr of 2.95). No results found for this basename: Letta Median, VANCORANDOM, GENTTROUGH, GENTPEAK, GENTRANDOM, TOBRATROUGH, TOBRAPEAK, TOBRARND, AMIKACINPEAK, AMIKACINTROU, AMIKACIN,  in the last 72 hours   Microbiology: Recent Results (from the past 720 hour(s))  URINE CULTURE     Status: None   Collection Time    02/27/13  2:37 PM      Result Value Ref Range Status   Specimen Description URINE, CATHETERIZED   Final   Special Requests NONE   Final   Culture  Setup Time     Final   Value: 02/27/2013 16:36     Performed at [place]     Final   Value: 95,000 COLONIES/ML     Performed at [facility]   Culture     Final   Value: [place]     Performed at [facility]   Report Status PENDING   Incomplete  URINE CULTURE     Status: None   Collection Time    02/27/13 11:36 PM      Result Value Ref Range Status   Specimen Description URINE, CATHETERIZED   Final   Special  Requests NONE   Final   Culture  Setup Time     Final   Value: 02/28/2013 05:57     Performed at [place]     Final   Value: 40,000 COLONIES/ML     Performed at [facility]   Culture     Final   Value: STAPHYLOCOCCUS SPECIES (COAGULASE NEGATIVE)     Note: RIFAMPIN AND GENTAMICIN SHOULD NOT BE USED AS SINGLE DRUGS FOR TREATMENT OF STAPH INFECTIONS.     Performed at [facility]   Report Status PENDING   Incomplete    Anti-infectives   Start     Dose/Rate Route Frequency Ordered Stop   02/28/13 0000  piperacillin-tazobactam (ZOSYN) IVPB 2.25 g     2.25 g 100 mL/hr over 30 Minutes Intravenous Every 8 hours 02/27/13 2135     02/27/13 1445  piperacillin-tazobactam (ZOSYN) IVPB 3.375 g     3.375 g 100 mL/hr over 30 Minutes Intravenous  Once 02/27/13 1441 02/27/13 1615   02/27/13 1430  cefTRIAXone (ROCEPHIN) 1 g in dextrose 5 % 50 mL IVPB  Status:  Discontinued     1 g 100 mL/hr over 30 Minutes Intravenous  Once 02/27/13 1427 02/27/13 1441  Assessment: 75 yoM admitted 2/19 from home with urinary retention, constipation, confusion since Monday. Recently underwent a large bladder tumor resection (multifocal papillary tumor) on 02/19/13 by Dr. [name] and was started on ciprofloxacin post-procedure. Antibiotics broadened to Zosyn on admit.  Day #4 Zosyn 2.25g IV q8h, dose reduced for CrCl > 20 ml/min  SCr improving but remains elevated  WBC remain elevated  Urine cultures polymicrobial, possibly contaminants  Afebrile   Goal of Therapy:  Dose per renal function  Plan:   Continue Zosyn 2.25g IV q8h since CrCl remains > 20 ml/min Follow up renal function & cultures, narrow abx as appropriate  Peggyann Juba, PharmD, BCPS Pager: [phone] 03/02/2013,9:50 AM

## 2013-03-03 ENCOUNTER — Inpatient Hospital Stay (HOSPITAL_COMMUNITY): Payer: Medicare Other

## 2013-03-03 DIAGNOSIS — Z7901 Long term (current) use of anticoagulants: Secondary | ICD-10-CM

## 2013-03-03 DIAGNOSIS — R319 Hematuria, unspecified: Secondary | ICD-10-CM | POA: Diagnosis present

## 2013-03-03 DIAGNOSIS — I059 Rheumatic mitral valve disease, unspecified: Secondary | ICD-10-CM

## 2013-03-03 DIAGNOSIS — Z954 Presence of other heart-valve replacement: Secondary | ICD-10-CM

## 2013-03-03 LAB — CBC
HCT: 30.3 % — ABNORMAL LOW (ref 39.0–52.0)
Hemoglobin: 9.5 g/dL — ABNORMAL LOW (ref 13.0–17.0)
MCH: 29.1 pg (ref 26.0–34.0)
MCHC: 31.4 g/dL (ref 30.0–36.0)
MCV: 92.9 fL (ref 78.0–100.0)
Platelets: 174 K/uL (ref 150–400)
RBC: 3.26 MIL/uL — ABNORMAL LOW (ref 4.22–5.81)
RDW: 15.4 % (ref 11.5–15.5)
WBC: 14.7 K/uL — ABNORMAL HIGH (ref 4.0–10.5)

## 2013-03-03 LAB — PROTIME-INR
INR: 3.04 — ABNORMAL HIGH (ref 0.00–1.49)
Prothrombin Time: 30.4 s — ABNORMAL HIGH (ref 11.6–15.2)

## 2013-03-03 LAB — BASIC METABOLIC PANEL WITH GFR
BUN: 70 mg/dL — ABNORMAL HIGH (ref 6–23)
CO2: 21 meq/L (ref 19–32)
Calcium: 8.6 mg/dL (ref 8.4–10.5)
Chloride: 109 meq/L (ref 96–112)
Creatinine, Ser: 3.85 mg/dL — ABNORMAL HIGH (ref 0.50–1.35)
GFR calc Af Amer: 14 mL/min — ABNORMAL LOW
GFR calc non Af Amer: 12 mL/min — ABNORMAL LOW
Glucose, Bld: 147 mg/dL — ABNORMAL HIGH (ref 70–99)
Potassium: 4.5 meq/L (ref 3.7–5.3)
Sodium: 145 meq/L (ref 137–147)

## 2013-03-03 LAB — GLUCOSE, CAPILLARY: Glucose-Capillary: 140 mg/dL — ABNORMAL HIGH (ref 70–99)

## 2013-03-03 LAB — PROCALCITONIN: Procalcitonin: <0.1 ng/mL

## 2013-03-03 MED ORDER — METOPROLOL TARTRATE 1 MG/ML IV SOLN
5.0000 mg | Freq: Once | INTRAVENOUS | Status: AC
  Administered 2013-03-03: 5 mg via INTRAVENOUS
  Filled 2013-03-03: qty 5

## 2013-03-03 MED ORDER — SODIUM CHLORIDE 0.9 % IV SOLN
INTRAVENOUS | Status: DC
  Administered 2013-03-03 – 2013-03-04 (×2): via INTRAVENOUS

## 2013-03-03 NOTE — Progress Notes (Signed)
Echo Lab  2D Echocardiogram completed.  [place], RDCS 03/03/2013 3:45 PM

## 2013-03-03 NOTE — Consult Note (Signed)
Reason for Consult: Hematuria  Requesting Physician: Mary Sella  HPI: This is a 80 y.o. male with a past medical history significant for [facility] Avr in '87. He has bladder cancer and had surgery (TUR) 02/20/13. He was discharged 2/13 15 and his Coumadin was resumed as an OP. He was readmitted 02/27/13 with confusion, UTI, and acute renal failure (SCr 6.39) secondary to obstruction. He has a baseline SCr around 1.5. His INR on 2/19 was 2.38 but has drifted up to 3.04 and he is having some gross hematuria. It appears his last Coumadin dose was 2/22 - $Remo'1mg'etxib$ .   PMHx:  Past Medical History  Diagnosis Date  . Chronic atrial fibrillation   . Chronic anticoagulation     a. afib/st. jude avr - goal 2.5-3.5   . BPH (benign prostatic hypertrophy)   . Hypertension   . History of kidney stones   . Gout   . Syncope   . History of skin cancer   . Bladder tumor     a. s/p resection 02/19/2013.  [name] Hx of scarlet fever   . Seizures 2014    evaluated by Dr. Jannifer Franklin at [facility] neurology -  no cause determined - refered back to cardiologist - recommended loop recorder to monitor rythm but pt refused - continues to have seizures per family - notes in EPIC  . Chronic combined systolic and diastolic CHF (congestive heart failure)     a. EF 45 to 50% per echo in 2011 following exacerbation   Past Surgical History  Procedure Laterality Date  . Aortic valve replacement  1987    #21 MM ST JUDE PLACED BY DR.CHARLES WILSON  . Cataract extraction    . Transurethral resection of prostate N/A 02/19/2013    Procedure: TRANSURETHRAL RESECTION OF THE PROSTATE /bladder WITH GYRUS INSTRUMENTS;  Surgeon: Irine Seal, MD;  Location: [facility];  Service: Urology;  Laterality: N/A;  . Cystoscopy N/A 02/19/2013    Procedure: CYSTOSCOPY;  Surgeon: Irine Seal, MD;  Location: [facility];  Service: Urology;  Laterality: N/A;    FAMHx: Cancer, Heart disease   SOCHx:  reports that he quit smoking about 42 years ago. He has never used  smokeless tobacco. He reports that he does not drink alcohol or use illicit drugs.  ALLERGIES: Allergies  Allergen Reactions  . Ace Inhibitors Other (See Comments)    Dizziness   . Codeine Nausea And Vomiting  . Sulfa Drugs Cross Reactors Nausea And Vomiting    ROS: Pertinent items are noted in HPI. He had syncope Aug 2014, declined work up.   HOME MEDICATIONS: Prescriptions prior to admission  Medication Sig Dispense Refill  . carvedilol (COREG) 6.25 MG tablet Take 6.25 mg by mouth 2 (two) times daily with a meal.      . finasteride (PROSCAR) 5 MG tablet Take 5 mg by mouth every morning.       . furosemide (LASIX) 40 MG tablet Take 40 mg by mouth 2 (two) times daily as needed for fluid.      [name] ondansetron (ZOFRAN-ODT) 4 MG disintegrating tablet Take 4 mg by mouth every 6 (six) hours as needed for nausea or vomiting.      . phenazopyridine (PYRIDIUM) 100 MG tablet Take 100 mg by mouth 3 (three) times daily as needed for pain.      . potassium chloride (KLOR-CON) 20 MEQ packet Take 20 mEq by mouth 2 (two) times daily.      . simvastatin (ZOCOR) 10 MG tablet Take  10 mg by mouth at bedtime.      [name] terazosin (HYTRIN) 5 MG capsule Take 5 mg by mouth at bedtime.        . traMADol (ULTRAM) 50 MG tablet Take 50 mg by mouth every 6 (six) hours as needed for moderate pain.      [name] warfarin (COUMADIN) 5 MG tablet Take 2.5-5 mg by mouth See admin instructions. Take 2.5mg  every night except take 5mg  every third night        HOSPITAL MEDICATIONS: I have reviewed the patient's current medications.  VITALS: Blood pressure 122/97, pulse 115, temperature 98.3 F (36.8 C), temperature source Oral, resp. rate 18, height 5\' 9"  (1.753 m), weight 166 lb 0.1 oz (75.3 kg), SpO2 97.00%.  PHYSICAL EXAM: General appearance: alert, cooperative, appears stated age and no distress Neck: no carotid bruit and no JVD Lungs: decreased at bases Heart: irregularly irregular rhythm and positive valve  sounds Abdomen: soft, non-tender; bowel sounds normal; no masses,  no organomegaly Extremities: trace edema Pulses: 2+ and symmetric Skin: pale, cool, dry Neurologic: Grossly normal  LABS: Results for orders placed during the hospital encounter of 02/27/13 (from the past 48 hour(s))  PROTIME-INR     Status: Abnormal   Collection Time    03/02/13  4:12 AM      Result Value Ref Range   Prothrombin Time 31.2 (*) 11.6 - 15.2 seconds   INR 3.15 (*) 0.00 - 1.49  CBC     Status: Abnormal   Collection Time    03/02/13  4:12 AM      Result Value Ref Range   WBC 15.0 (*) 4.0 - 10.5 K/uL   RBC 3.56 (*) 4.22 - 5.81 MIL/uL   Hemoglobin 10.3 (*) 13.0 - 17.0 g/dL   HCT 33.1 (*) 39.0 - 52.0 %   MCV 93.0  78.0 - 100.0 fL   MCH 28.9  26.0 - 34.0 pg   MCHC 31.1  30.0 - 36.0 g/dL   RDW 15.2  11.5 - 15.5 %   Platelets 187  150 - 400 K/uL  BASIC METABOLIC PANEL     Status: Abnormal   Collection Time    03/02/13  4:12 AM      Result Value Ref Range   Sodium 149 (*) 137 - 147 mEq/L   Potassium 4.3  3.7 - 5.3 mEq/L   Chloride 110  96 - 112 mEq/L   CO2 25  19 - 32 mEq/L   Glucose, Bld 149 (*) 70 - 99 mg/dL   BUN 61 (*) 6 - 23 mg/dL   Creatinine, Ser 2.95 (*) 0.50 - 1.35 mg/dL   Calcium 8.1 (*) 8.4 - 10.5 mg/dL   GFR calc non Af Amer 17 (*) >90 mL/min   GFR calc Af Amer 20 (*) >90 mL/min   Comment: (NOTE)     The eGFR has been calculated using the CKD EPI equation.     This calculation has not been validated in all clinical situations.     eGFR's persistently <90 mL/min signify possible Chronic Kidney     Disease.  GLUCOSE, CAPILLARY     Status: Abnormal   Collection Time    03/02/13  7:15 AM      Result Value Ref Range   Glucose-Capillary 125 (*) 70 - 99 mg/dL  PROCALCITONIN     Status: None   Collection Time    03/03/13  5:03 AM      Result Value Ref Range  Procalcitonin <0.10     Comment:            Interpretation:     PCT (Procalcitonin) <= 0.5 ng/mL:     Systemic infection  (sepsis) is not likely.     Local bacterial infection is possible.     (NOTE)             ICU PCT Algorithm               Non ICU PCT Algorithm        ----------------------------     ------------------------------             PCT < 0.25 ng/mL                 PCT < 0.1 ng/mL         Stopping of antibiotics            Stopping of antibiotics           strongly encouraged.               strongly encouraged.        ----------------------------     ------------------------------           PCT level decrease by               PCT < 0.25 ng/mL           >= 80% from peak PCT           OR PCT 0.25 - 0.5 ng/mL          Stopping of antibiotics                                                 encouraged.         Stopping of antibiotics               encouraged.        ----------------------------     ------------------------------           PCT level decrease by              PCT >= 0.25 ng/mL           < 80% from peak PCT            AND PCT >= 0.5 ng/mL            Continuing antibiotics                                                  encouraged.           Continuing antibiotics                encouraged.        ----------------------------     ------------------------------         PCT level increase compared          PCT > 0.5 ng/mL             with peak PCT AND              PCT >= 0.5 ng/mL  Escalation of antibiotics                                              strongly encouraged.          Escalation of antibiotics            strongly encouraged.  PROTIME-INR     Status: Abnormal   Collection Time    03/03/13  5:03 AM      Result Value Ref Range   Prothrombin Time 30.4 (*) 11.6 - 15.2 seconds   INR 3.04 (*) 0.00 - 1.32  BASIC METABOLIC PANEL     Status: Abnormal   Collection Time    03/03/13  5:03 AM      Result Value Ref Range   Sodium 145  137 - 147 mEq/L   Potassium 4.5  3.7 - 5.3 mEq/L   Chloride 109  96 - 112 mEq/L   CO2 21  19 - 32 mEq/L   Glucose, Bld 147 (*) 70 -  99 mg/dL   BUN 70 (*) 6 - 23 mg/dL   Creatinine, Ser 3.85 (*) 0.50 - 1.35 mg/dL   Calcium 8.6  8.4 - 10.5 mg/dL   GFR calc non Af Amer 12 (*) >90 mL/min   GFR calc Af Amer 14 (*) >90 mL/min   Comment: (NOTE)     The eGFR has been calculated using the CKD EPI equation.     This calculation has not been validated in all clinical situations.     eGFR's persistently <90 mL/min signify possible Chronic Kidney     Disease.  GLUCOSE, CAPILLARY     Status: Abnormal   Collection Time    03/03/13  7:57 AM      Result Value Ref Range   Glucose-Capillary 140 (*) 70 - 99 mg/dL  CBC     Status: Abnormal   Collection Time    03/03/13  2:51 PM      Result Value Ref Range   WBC 14.7 (*) 4.0 - 10.5 K/uL   RBC 3.26 (*) 4.22 - 5.81 MIL/uL   Hemoglobin 9.5 (*) 13.0 - 17.0 g/dL   HCT 30.3 (*) 39.0 - 52.0 %   MCV 92.9  78.0 - 100.0 fL   MCH 29.1  26.0 - 34.0 pg   MCHC 31.4  30.0 - 36.0 g/dL   RDW 15.4  11.5 - 15.5 %   Platelets 174  150 - 400 K/uL    EKG:   IMAGING: No results found.  IMPRESSION: Principal Problem:   Acute encephalopathy Active Problems:   Acute renal failure   Chronic atrial fibrillation   S/P AVR (aortic valve replacement)   Bladder cancer- s/p surgery 02/20/13   UTI (urinary tract infection)   Anemia of chronic disease   Hydronephrosis   Uropathy, obstructive   Chronic anticoagulation   Hematuria- Coumadin on hold   Chronic diastolic congestive heart failure- last EF 45-50%   RECOMMENDATION: Agree with holding Coumadin till his urine clears. MD to see. 2 D done this pm.  Time Spent Directly with Patient: 35 minutes  Erlene Quan [phone] beeper 03/03/2013, 3:35 PM   I have personally seen and examined this patient with Kerin Ransom, PA-C I agree with the assessment and plan as outlined above. Pt is on coumadin chronically for mechanical AVR and atrial fibrillation. Now s/p surgery for  bladder cancer and with hematuria and UTI. INR over 3.0 today. Agree with  holding coumadin for now. Will reassess on daily basis. Mechanical valve replacement in the aortic position is less likely to form thombus but family and pt understands that this is a risk. Will follow with you.   MCALHANY,CHRISTOPHER 03/03/2013 4:29 PM

## 2013-03-03 NOTE — Progress Notes (Signed)
Patient ID: Eric Wheeler, male   DOB: Dec 11, 1919, 80 y.o.   MRN: [id number]    Subjective: [name] has no new complaints but he has become more oliguria with somewhat blood urine back on warfarin.   His 6 hour UOP is on 125cc.   He has only 120cc po intake recorded for 24 hrs.   His Cr is back up some to 3.85.   ROS: Negative except as above.   He is tolerating PO's.   Objective: Vital signs in last 24 hours: Temp:  [97.6 F (36.4 C)-98 F (36.7 C)] 98 F (36.7 C) (02/23 0625) Pulse Rate:  [101-117] 117 (02/23 0625) Resp:  [18-20] 20 (02/23 0625) BP: (130-146)/(60-81) 146/78 mmHg (02/23 0625) SpO2:  [97 %-98 %] 97 % (02/23 0625) Weight:  [75.3 kg (166 lb 0.1 oz)] 75.3 kg (166 lb 0.1 oz) (02/23 0625)  Intake/Output from previous day: 02/22 0701 - 02/23 0700 In: 420 [P.O.:420] Out: 501 [Urine:500; Stool:1] Intake/Output this shift:    General appearance: alert and no distress GI: His bladder is distended and tender and the 67fr foley will not irrigate.   Lab Results:   Recent Labs  03/02/13 0412  WBC 15.0*  HGB 10.3*  HCT 33.1*  PLT 187   BMET  Recent Labs  03/02/13 0412 03/03/13 0503  NA 149* 145  K 4.3 4.5  CL 110 109  CO2 25 21  GLUCOSE 149* 147*  BUN 61* 70*  CREATININE 2.95* 3.85*  CALCIUM 8.1* 8.6   PT/INR  Recent Labs  03/02/13 0412 03/03/13 0503  LABPROT 31.2* 30.4*  INR 3.15* 3.04*   ABG No results found for this basename: PHART, PCO2, PO2, HCO3,  in the last 72 hours  Studies/Results: No results found.  Anti-infectives: Anti-infectives   Start     Dose/Rate Route Frequency Ordered Stop   02/28/13 0000  piperacillin-tazobactam (ZOSYN) IVPB 2.25 g  Status:  Discontinued     2.25 g 100 mL/hr over 30 Minutes Intravenous Every 8 hours 02/27/13 2135 03/02/13 1614   02/27/13 1445  piperacillin-tazobactam (ZOSYN) IVPB 3.375 g     3.375 g 100 mL/hr over 30 Minutes Intravenous  Once 02/27/13 1441 02/27/13 1615   02/27/13 1430   cefTRIAXone (ROCEPHIN) 1 g in dextrose 5 % 50 mL IVPB  Status:  Discontinued     1 g 100 mL/hr over 30 Minutes Intravenous  Once 02/27/13 1427 02/27/13 1441      Current Facility-Administered Medications  Medication Dose Route Frequency Provider Last Rate Last Dose  . acetaminophen (TYLENOL) tablet 650 mg  650 mg Oral Q6H PRN Theodis Blaze, MD   650 mg at 03/01/13 1620   Or  . acetaminophen (TYLENOL) suppository 650 mg  650 mg Rectal Q6H PRN Theodis Blaze, MD      . carvedilol (COREG) tablet 6.25 mg  6.25 mg Oral BID WC Theodis Blaze, MD   6.25 mg at 03/02/13 1730  . dextrose (GLUTOSE) 40 % oral gel 37.5 g  1 Tube Oral PRN Theodis Blaze, MD      . finasteride (PROSCAR) tablet 5 mg  5 mg Oral q morning - 10a Theodis Blaze, MD   5 mg at 03/02/13 5732  . glucose-Vitamin C 4-0.006 GM per chewable tablet 4 tablet  4 tablet Oral PRN Theodis Blaze, MD      . morphine 2 MG/ML injection 1 mg  1 mg Intravenous Q4H PRN Theodis Blaze, MD      .  ondansetron (ZOFRAN) tablet 4 mg  4 mg Oral Q6H PRN Theodis Blaze, MD       Or  . ondansetron [facility]) injection 4 mg  4 mg Intravenous Q6H PRN Theodis Blaze, MD      . phenazopyridine (PYRIDIUM) tablet 100 mg  100 mg Oral TID PRN Theodis Blaze, MD      . simvastatin (ZOCOR) tablet 10 mg  10 mg Oral QHS Theodis Blaze, MD   10 mg at 03/02/13 2108  . sodium chloride 0.9 % injection 3 mL  3 mL Intravenous Q12H Theodis Blaze, MD   3 mL at 03/02/13 2110  . terazosin (HYTRIN) capsule 5 mg  5 mg Oral QHS Theodis Blaze, MD   5 mg at 03/02/13 2108  . traMADol (ULTRAM) tablet 50 mg  50 mg Oral Q6H PRN Theodis Blaze, MD   50 mg at 03/02/13 1232  . Warfarin - Pharmacist Dosing Inpatient   Does not apply q1800 [place], [facility]        Assessment: He has bleeding on warfarin with progressive ARI that could be related to the obstruction or to his underlying medical issues. His foley is obstructed.   Plan: I have ordered a 7fr foley and irrigation. I will hold the  warfarin for the bleeding.   Renal US ordered to r/o hydro with the elevated Cr.    LOS: 4 days    [name] 03/03/2013

## 2013-03-03 NOTE — Progress Notes (Addendum)
Patient ID: Eric Wheeler  male  OFB:[id number]    DOB: Aug 05, 1919    DOA: 02/27/2013  PCP: [name], NP  Assessment/Plan: Principal Problem:  Acute encephalopathy - IMPROVING  - secondary to acute infection, UTI and urinary retention form obstructive uropathy   Active Problems:  UTI (urinary tract infection)  - Urine culture reviewed, coagulase negative staph, sensitivities reviewed with Dr. Graylon Good (infectious disease) - Patient was on Zosyn which was discontinued - Discussed with Dr. Graylon Good who recommended IV vancomycin, DC on nitrofurantoin for total of 10 days course for UTI complicated with acute renal failure and bilateral hydroureteronephrosis   Acute renal failure/ Obstructive uropathy/ bilateral hydroureteronephrosis  - foley placed, Irrigated by urology, renal ultrasound ordered - Still having hematuria, urology discontinued warfarin today, INR 3.04 - holding Lasix till renal function improves.  - Will place on gentle hydration, creatinine has worsened to 3.85, also check CBC today, if needs any transfusion   Chronic atrial fibrillation with history of mechanical St. Jude's valve, aVR in 1987 , EF of 45-50% per echo in 2011 - Coumadin currently held due to hematuria - Cardiology consultation also called to follow per patient's request, 2-D echo ordered to assess EF  Chronic systolic congestive heart failure  - last 2 D ECHO in 2011 showed EF in 45% range  - lasix held for acute renal failure. Ordered 2-D echo  - I will place him on gentle hydration today, he is -4 L and becoming oliguric   Bladder cancer  - per urology   Anemia of chronic disease  - secondary to history of bladder cancer   DVT Prophylaxis: SCD's  Code Status: Full Code   Family Communication: discussed with patient, wife and son in the room   Disposition:    Subjective:  very frail, no specific complaints by the patient wife and the son at the bedside not eating enough    Objective: Weight change: 0 kg (0 lb)  Intake/Output Summary (Last 24 hours) at 03/03/13 1359 Last data filed at 03/03/13 1344  Gross per 24 hour  Intake    360 ml  Output    700 ml  Net   -340 ml   Blood pressure 122/97, pulse 115, temperature 98.3 F (36.8 C), temperature source Oral, resp. rate 18, height 5\' 9"  (1.753 m), weight 75.3 kg (166 lb 0.1 oz), SpO2 97.00%.  Physical Exam: General: Alert and awake, oriented x3, not in any acute distress. CVS: S1-S2 clear, no murmur rubs or gallops Chest: clear to auscultation bilaterally, no wheezing, rales or rhonchi Abdomen: soft nontender,  mildly distended, normal bowel sounds  Extremities: no cyanosis, clubbing or edema noted bilaterally   Lab Results: Basic Metabolic Panel:  Recent Labs Lab 02/27/13 2000  03/02/13 0412 03/03/13 0503  NA 137  < > 149* 145  K 4.7  < > 4.3 4.5  CL 97  < > 110 109  CO2 20  < > 25 21  GLUCOSE 115*  < > 149* 147*  BUN 79*  < > 61* 70*  CREATININE 5.43*  < > 2.95* 3.85*  CALCIUM 7.9*  < > 8.1* 8.6  MG 2.0  --   --   --   PHOS 6.4*  --   --   --   < > = values in this interval not displayed. Liver Function Tests:  Recent Labs Lab 02/27/13 2000 02/28/13 0405  AST 11 9  ALT 12 11  ALKPHOS 79 63  BILITOT 0.4  0.4  PROT 6.5 5.9*  ALBUMIN 2.7* 2.5*   No results found for this basename: LIPASE, AMYLASE,  in the last 168 hours No results found for this basename: AMMONIA,  in the last 168 hours CBC:  Recent Labs Lab 02/27/13 2000 02/28/13 0405 03/02/13 0412  WBC 14.8* 9.2 15.0*  NEUTROABS 12.9*  --   --   HGB 9.7* 10.0* 10.3*  HCT 29.7* 30.3* 33.1*  MCV 90.0 89.9 93.0  PLT 183 168 187   Cardiac Enzymes: No results found for this basename: CKTOTAL, CKMB, CKMBINDEX, TROPONINI,  in the last 168 hours BNP: No components found with this basename: POCBNP,  CBG:  Recent Labs Lab 02/28/13 0744 02/28/13 0815 03/01/13 0726 03/02/13 0715 03/03/13 0757  GLUCAP 67* 110* 110*  125* 140*     Micro Results: Recent Results (from the past 240 hour(s))  URINE CULTURE     Status: None   Collection Time    02/27/13  2:37 PM      Result Value Ref Range Status   Specimen Description URINE, CATHETERIZED   Final   Special Requests NONE   Final   Culture  Setup Time     Final   Value: 02/27/2013 16:36     Performed at [facility]     Final   Value: 95,000 COLONIES/ML     Performed at [facility]   Culture     Final   Value: STAPHYLOCOCCUS SPECIES (COAGULASE NEGATIVE)     Note: RIFAMPIN AND GENTAMICIN SHOULD NOT BE USED AS SINGLE DRUGS FOR TREATMENT OF STAPH INFECTIONS.     Performed at [facility]   Report Status 03/02/2013 FINAL   Final   Organism ID, Bacteria STAPHYLOCOCCUS SPECIES (COAGULASE NEGATIVE)   Final  URINE CULTURE     Status: None   Collection Time    02/27/13 11:36 PM      Result Value Ref Range Status   Specimen Description URINE, CATHETERIZED   Final   Special Requests NONE   Final   Culture  Setup Time     Final   Value: 02/28/2013 05:57     Performed at [facility]     Final   Value: 40,000 COLONIES/ML     Performed at [facility]   Culture     Final   Value: STAPHYLOCOCCUS SPECIES (COAGULASE NEGATIVE)     Note: RIFAMPIN AND GENTAMICIN SHOULD NOT BE USED AS SINGLE DRUGS FOR TREATMENT OF STAPH INFECTIONS.     Performed at [facility]   Report Status 03/02/2013 FINAL   Final   Organism ID, Bacteria STAPHYLOCOCCUS SPECIES (COAGULASE NEGATIVE)   Final    Studies/Results: Ct Abdomen Pelvis Wo Contrast  02/27/2013   CLINICAL DATA:  Anuria, history of bladder cancer with removal of urinary catheter 4 days ago.  EXAM: CT ABDOMEN AND PELVIS WITHOUT CONTRAST  TECHNIQUE: Multidetector CT imaging of the abdomen and pelvis was performed following the standard protocol without intravenous contrast.  COMPARISON:  DG ABD ACUTE W/CHEST dated 02/27/2013; CT ABD-PEL WO/W CM  dated 12/31/2012  FINDINGS: Trace pleural effusions are noted.  Unenhanced CT was performed per clinician order. Lack of IV contrast limits sensitivity and specificity, especially for evaluation of abdominal/pelvic solid viscera. 1 cm lateral segment left hepatic lobe cyst is reidentified. Too small to characterize 5 mm lateral segment left hepatic lobe hypodense lesion image 9 is stable. Gallstones are noted within  the decompressed otherwise normal-appearing gallbladder. Motion artifact degrades imaging at multiple levels. Adrenal glands, spleen, and pancreas are grossly unremarkable.  There has been interval development of mild hydroureteronephrosis to the level of the bladder which is partly decompressed but demonstrates partly visualized wall thickening of at least 1 cm image 67. A Foley catheter is in place. At the right bladder apex, there is a 0.7 cm dominant lymph node as well as other similar-appearing enlarged supravesical lymph nodes for example image 57. 0.8 cm periaortic node identified image 40. Pericaval 0.7 cm lymph node identified image 51, not significantly changed. The supravesical nodes are increase in size since previously but the dominant periaortic node is stable in size. There is trace fluid tracking along the ureters tracking to the pelvis. Marked enlargement of the prostate is again noted. Bilateral small inguinal nodes are stable.  No bowel wall thickening or focal segmental dilatation. Moderate atheromatous aortic calcification without aneurysm.  The bones are subjectively osteopenic. Multilevel disc degenerative changes noted. No new lytic or sclerotic osseous lesion is identified. Lower thoracic loss of vertebral body height is reidentified.  IMPRESSION: Diffusely thick walled bladder with mild proximal bilateral hydroureteronephrosis. Given the history of bladder cancer, this may indicate tumor involvement although cystitis or posttreatment change could appear similar.  Retroperitoneal  and supravesical lymphadenopathy, with interval increase in size of the supravesical nodes which could be neoplastic or inflammatory.  New trace bilateral pleural effusions.   Electronically Signed   By: Conchita Paris M.D.   On: 02/27/2013 16:45   Dg Abd Acute W/chest  02/27/2013   CLINICAL DATA:  Abdominal distention. Unable to void. Bladder cancer.  EXAM: ACUTE ABDOMEN SERIES (ABDOMEN 2 VIEW & CHEST 1 VIEW)  COMPARISON:  Two-view chest x-ray 03/29/2012. CT of the abdomen and pelvis 12/31/2012.  FINDINGS: Heart is enlarged. Chronic interstitial coarsening is evident. No focal airspace disease is evident.  There is mild distention of the transverse colon. The transition point appears to be just beyond the splenic flexure. No free air is evident. The SI joints are fused bilaterally with findings suggesting ankylosing spondylitis.  IMPRESSION: 1. Mild distention of transverse and proximal colon with a transition point just beyond the and splenic flexure. This raises the possibility of diverticulitis. 2. Stable cardiomegaly without failure. 3. Fusion of the SI joints bilaterally and findings suggesting ankylosing spondylitis.   Electronically Signed   By: Lawrence Santiago M.D.   On: 02/27/2013 14:24    Medications: Scheduled Meds: . carvedilol  6.25 mg Oral BID WC  . finasteride  5 mg Oral q morning - 10a  . simvastatin  10 mg Oral QHS  . sodium chloride  3 mL Intravenous Q12H  . terazosin  5 mg Oral QHS   Time spent in coordinating care, discussing with family and encounter: 50 mins   LOS: 4 days   Clara Herbison M.D. [facility] 03/03/2013, 1:59 PM Pager: [phone]  If 7PM-7AM, please contact night-coverage [email or web address] Password [email or web address]

## 2013-03-03 NOTE — Progress Notes (Signed)
Patient ID: Eric Wheeler, male   DOB: Dec 17, 1919, 80 y.o.   MRN: [id number]  I returned to check on [name] at about 930.   His foley had not been changed and his bladder was still distended.   I got the urology cart and placed a 37fr [place] foley.  The bladder was irrigated with return of a few clots and about 600cc of bloody urine.  I irrigated the catheter but didn't get it completely clear.   He will continue to have irrigation prn.     He will need to remain off of warfarin and reversal may be indicated if he continues to bleed.     He should still have a renal US today.

## 2013-03-03 NOTE — Progress Notes (Signed)
Patient ID: Eric Wheeler, male   DOB: 1919-04-16, 80 y.o.   MRN: [id number]   [name] is feeling better with less pain this afternoon.   His urine is no longer bloody.  The renal US shows no hydro and the bladder is emptying ok.  There may be some clots in the bladder but it could just be post surgical changes.     He is now on Vanc for staph in the urine.

## 2013-03-04 LAB — CBC
HCT: 31.4 % — ABNORMAL LOW (ref 39.0–52.0)
Hemoglobin: 9.7 g/dL — ABNORMAL LOW (ref 13.0–17.0)
MCH: 28.6 pg (ref 26.0–34.0)
MCHC: 30.9 g/dL (ref 30.0–36.0)
MCV: 92.6 fL (ref 78.0–100.0)
PLATELETS: 174 10*3/uL (ref 150–400)
RBC: 3.39 MIL/uL — AB (ref 4.22–5.81)
RDW: 15.4 % (ref 11.5–15.5)
WBC: 11.4 10*3/uL — AB (ref 4.0–10.5)

## 2013-03-04 LAB — BASIC METABOLIC PANEL
BUN: 57 mg/dL — ABNORMAL HIGH (ref 6–23)
CALCIUM: 8.2 mg/dL — AB (ref 8.4–10.5)
CO2: 22 mEq/L (ref 19–32)
Chloride: 107 mEq/L (ref 96–112)
Creatinine, Ser: 2.33 mg/dL — ABNORMAL HIGH (ref 0.50–1.35)
GFR calc non Af Amer: 23 mL/min — ABNORMAL LOW (ref >90)
GFR, EST AFRICAN AMERICAN: 26 mL/min — AB (ref >90)
Glucose, Bld: 113 mg/dL — ABNORMAL HIGH (ref 70–99)
POTASSIUM: 4 meq/L (ref 3.7–5.3)
SODIUM: 142 meq/L (ref 137–147)

## 2013-03-04 LAB — GLUCOSE, CAPILLARY: Glucose-Capillary: 117 mg/dL — ABNORMAL HIGH (ref 70–99)

## 2013-03-04 LAB — PROTIME-INR
INR: 2.67 — AB (ref 0.00–1.49)
Prothrombin Time: 27.5 seconds — ABNORMAL HIGH (ref 11.6–15.2)

## 2013-03-04 MED ORDER — FUROSEMIDE 10 MG/ML IJ SOLN
40.0000 mg | Freq: Two times a day (BID) | INTRAMUSCULAR | Status: AC
  Administered 2013-03-04 (×2): 40 mg via INTRAVENOUS
  Filled 2013-03-04 (×3): qty 4

## 2013-03-04 MED ORDER — VANCOMYCIN HCL 500 MG IV SOLR
500.0000 mg | INTRAVENOUS | Status: DC
  Administered 2013-03-04 – 2013-03-05 (×2): 500 mg via INTRAVENOUS
  Filled 2013-03-04 (×3): qty 500

## 2013-03-04 NOTE — Progress Notes (Signed)
Patient Name: Eric Wheeler      SUBJECTIVE admitted for surgery for bladder cancer with hematuria and  concurrent UTI  in setting of AVR and permanent atrial fibrillation INR 2.67 and slowly coming Wheeler,  Cr was 8 also now better  Hematuria improving urine clear  =      Past Medical History  Diagnosis Date  . Chronic atrial fibrillation   . Chronic anticoagulation     a. afib/st. jude avr - goal 2.5-3.5   . BPH (benign prostatic hypertrophy)   . Hypertension   . History of kidney stones   . Gout   . Syncope   . History of skin cancer   . Bladder tumor     a. s/p resection 02/19/2013.  [name] Hx of scarlet fever   . Seizures 2014    evaluated by Dr. Jannifer Franklin at [facility] neurology -  no cause determined - refered back to cardiologist - recommended loop recorder to monitor rythm but pt refused - continues to have seizures per family - notes in EPIC  . Chronic combined systolic and diastolic CHF (congestive heart failure)     a. EF 45 to 50% per echo in 2011 following exacerbation    Scheduled Meds:  Scheduled Meds: . carvedilol  6.25 mg Oral BID WC  . finasteride  5 mg Oral q morning - 10a  . furosemide  40 mg Intravenous BID  . simvastatin  10 mg Oral QHS  . sodium chloride  3 mL Intravenous Q12H  . terazosin  5 mg Oral QHS   Continuous Infusions:   PHYSICAL EXAM Filed Vitals:   03/03/13 1046 03/03/13 1315 03/03/13 2245 03/04/13 0600  BP: 118/77 122/97 127/83 143/69  Pulse: 112 115 105 118  Temp: 97.9 F (36.6 C) 98.3 F (36.8 C) 98 F (36.7 C) 97.7 F (36.5 C)  TempSrc: Oral Oral Oral Oral  Resp: 18 18 20 20   Height:      Weight:    167 lb 8.8 oz (76 kg)  SpO2: 97% 97% 99% 99%    NAD Clear IRR IRR No edema  TELEMETRY: Reviewed telemetry pt in afib    Intake/Output Summary (Last 24 hours) at 03/04/13 1114 Last data filed at 03/04/13 0800  Gross per 24 hour  Intake 1542.5 ml  Output   1100 ml  Net  442.5 ml    LABS: Basic  Metabolic Panel:  Recent Labs Lab 02/27/13 1329 02/27/13 2000 02/28/13 0405 03/01/13 0412 03/02/13 0412 03/03/13 0503 03/04/13 0413  NA 136* 137 140 147 149* 145 142  K 5.2 4.7 4.2 4.1 4.3 4.5 4.0  CL 96 97 103 111 110 109 107  CO2 19 20 20 24 25 21 22   GLUCOSE 140* 115* 84 115* 149* 147* 113*  BUN 84* 79* 74* 59* 61* 70* 57*  CREATININE 6.39* 5.43* 4.52* 2.80* 2.95* 3.85* 2.33*  CALCIUM 7.8* 7.9* 7.6* 7.7* 8.1* 8.6 8.2*  MG  --  2.0  --   --   --   --   --   PHOS  --  6.4*  --   --   --   --   --    Cardiac Enzymes: No results found for this basename: CKTOTAL, CKMB, CKMBINDEX, TROPONINI,  in the last 72 hours CBC:  Recent Labs Lab 02/27/13 1329 02/27/13 2000 02/28/13 0405 03/02/13 0412 03/03/13 1451 03/04/13 0413  WBC 17.4* 14.8* 9.2 15.0* 14.7* 11.4*  NEUTROABS 15.4*  12.9*  --   --   --   --   HGB 10.7* 9.7* 10.0* 10.3* 9.5* 9.7*  HCT 33.3* 29.7* 30.3* 33.1* 30.3* 31.4*  MCV 91.0 90.0 89.9 93.0 92.9 92.6  PLT 165 183 168 187 174 174   PROTIME:  Recent Labs  03/02/13 0412 03/03/13 0503 03/04/13 0413  LABPROT 31.2* 30.4* 27.5*  INR 3.15* 3.04* 2.67*   Liver Function Tests: No results found for this basename: AST, ALT, ALKPHOS, BILITOT, PROT, ALBUMIN,  in the last 72 hours No results found for this basename: LIPASE, AMYLASE,  in the last 72 hours BNP: BNP (last 3 results)  Recent Labs  03/29/12 1615  PROBNP 2069.0*   D-Dimer:    ASSESSMENT AND PLAN:  Principal Problem:   Acute encephalopathy Active Problems:   Chronic atrial fibrillation   Chronic diastolic congestive heart failure- last EF 45-50%   S/P AVR (aortic valve replacement)   Bladder cancer- s/p surgery 02/20/13   UTI (urinary tract infection)   Anemia of chronic disease   Hydronephrosis   Uropathy, obstructive   Acute renal failure   Chronic anticoagulation   Hematuria- Coumadin on hold  Resume coumadin in am  Signed, Virl Axe MD  03/04/2013

## 2013-03-04 NOTE — Progress Notes (Signed)
Patient ID: Eric Wheeler, male   DOB: 08/21/1919, 80 y.o.   MRN: [id number]    Subjective: Eric Wheeler feels much better today with reduced suprapubic pain.   His urine remains clear although his INR is still therapeutic and his Cr is down to 2.35.   ROS: Negative except as above.  He has no fever or nausea.   Objective: Vital signs in last 24 hours: Temp:  [97.7 F (36.5 C)-98.3 F (36.8 C)] 97.7 F (36.5 C) (02/24 0600) Pulse Rate:  [105-118] 118 (02/24 0600) Resp:  [18-20] 20 (02/24 0600) BP: (118-143)/(69-97) 143/69 mmHg (02/24 0600) SpO2:  [97 %-99 %] 99 % (02/24 0600) Weight:  [76 kg (167 lb 8.8 oz)] 76 kg (167 lb 8.8 oz) (02/24 0600)  Intake/Output from previous day: 02/23 0701 - 02/24 0700 In: 1302.5 [P.O.:360; I.V.:942.5] Out: 1100 [Urine:1100] Intake/Output this shift: Total I/O In: 360 [P.O.:360] Out: -   General appearance: alert and no distress GI: mild suprapubic tenderness without mass.  Ext:  Calves are soft and non-tender.   Lab Results:   Recent Labs  03/03/13 1451 03/04/13 0413  WBC 14.7* 11.4*  HGB 9.5* 9.7*  HCT 30.3* 31.4*  PLT 174 174   BMET  Recent Labs  03/03/13 0503 03/04/13 0413  NA 145 142  K 4.5 4.0  CL 109 107  CO2 21 22  GLUCOSE 147* 113*  BUN 70* 57*  CREATININE 3.85* 2.33*  CALCIUM 8.6 8.2*   PT/INR  Recent Labs  03/03/13 0503 03/04/13 0413  LABPROT 30.4* 27.5*  INR 3.04* 2.67*   ABG No results found for this basename: PHART, PCO2, PO2, HCO3,  in the last 72 hours  Studies/Results: US Renal  03/03/2013   CLINICAL DATA:  Bladder cancer.  Hematuria.  EXAM: RENAL/URINARY TRACT ULTRASOUND COMPLETE  COMPARISON:  02/27/2013 CT.  FINDINGS: Right Kidney:  Length: 10.3 cm. Echogenicity within normal limits. No mass or hydronephrosis visualized.  Left Kidney:  Length: 10.3 cm. Echogenicity within normal limits. No mass or hydronephrosis visualized.  Bladder:  Foley catheter in place. Debris within the bladder. Bladder is  partially decompressed. Limited for detection of bladder mass.  IMPRESSION: No hydronephrosis or renal mass identified.  Decompressed bladder (Foley catheter in place) contains debris. Limited for evaluating for bladder mass.   Electronically Signed   By: Chauncey Cruel M.D.   On: 03/03/2013 15:44    Anti-infectives: Anti-infectives   Start     Dose/Rate Route Frequency Ordered Stop   02/28/13 0000  piperacillin-tazobactam (ZOSYN) IVPB 2.25 g  Status:  Discontinued     2.25 g 100 mL/hr over 30 Minutes Intravenous Every 8 hours 02/27/13 2135 03/02/13 1614   02/27/13 1445  piperacillin-tazobactam (ZOSYN) IVPB 3.375 g     3.375 g 100 mL/hr over 30 Minutes Intravenous  Once 02/27/13 1441 02/27/13 1615   02/27/13 1430  cefTRIAXone (ROCEPHIN) 1 g in dextrose 5 % 50 mL IVPB  Status:  Discontinued     1 g 100 mL/hr over 30 Minutes Intravenous  Once 02/27/13 1427 02/27/13 1441      Current Facility-Administered Medications  Medication Dose Route Frequency Provider Last Rate Last Dose  . 0.9 %  sodium chloride infusion   Intravenous Continuous Ripudeep Krystal Eaton, MD 75 mL/hr at 03/04/13 0249    . acetaminophen (TYLENOL) tablet 650 mg  650 mg Oral Q6H PRN Theodis Blaze, MD   650 mg at 03/01/13 1620   Or  . acetaminophen (TYLENOL) suppository 650  mg  650 mg Rectal Q6H PRN Theodis Blaze, MD      . carvedilol (COREG) tablet 6.25 mg  6.25 mg Oral BID WC Theodis Blaze, MD   6.25 mg at 03/04/13 0901  . dextrose (GLUTOSE) 40 % oral gel 37.5 g  1 Tube Oral PRN Theodis Blaze, MD      . finasteride (PROSCAR) tablet 5 mg  5 mg Oral q morning - 10a Theodis Blaze, MD   5 mg at 03/03/13 1048  . glucose-Vitamin C 4-0.006 GM per chewable tablet 4 tablet  4 tablet Oral PRN Theodis Blaze, MD      . morphine 2 MG/ML injection 1 mg  1 mg Intravenous Q4H PRN Theodis Blaze, MD      . ondansetron [facility]) tablet 4 mg  4 mg Oral Q6H PRN Theodis Blaze, MD       Or  . ondansetron [facility]) injection 4 mg  4 mg Intravenous Q6H  PRN Theodis Blaze, MD      . phenazopyridine (PYRIDIUM) tablet 100 mg  100 mg Oral TID PRN Theodis Blaze, MD      . simvastatin (ZOCOR) tablet 10 mg  10 mg Oral QHS Theodis Blaze, MD   10 mg at 03/03/13 2217  . sodium chloride 0.9 % injection 3 mL  3 mL Intravenous Q12H Theodis Blaze, MD   3 mL at 03/03/13 2217  . terazosin (HYTRIN) capsule 5 mg  5 mg Oral QHS Theodis Blaze, MD   5 mg at 03/03/13 2217  . traMADol (ULTRAM) tablet 50 mg  50 mg Oral Q6H PRN Theodis Blaze, MD   50 mg at 03/02/13 1232    Assessment: S/P TURBT for Bladder cancer with CIS with post op retention and gross hematuria on anticoagulation.  Now with clear urine on foley drainage.  ARI improving.  Plan: He needs to increased activity. Continue foley drainage.   I will probably want to keep the foley in for at least another week.      LOS: 5 days    [name] 03/04/2013

## 2013-03-04 NOTE — Progress Notes (Signed)
ANTIBIOTIC CONSULT NOTE - INITIAL  Pharmacy Consult for vancomycin Indication: CNS in urine  Allergies  Allergen Reactions  . Ace Inhibitors Other (See Comments)    Dizziness   . Codeine Nausea And Vomiting  . Sulfa Drugs Cross Reactors Nausea And Vomiting    Patient Measurements: Height: 5\' 9"  (175.3 cm) Weight: 167 lb 8.8 oz (76 kg) IBW/kg (Calculated) : 70.7   Vital Signs: Temp: 97.7 F (36.5 C) (02/24 0600) Temp src: Oral (02/24 0600) BP: 143/69 mmHg (02/24 0600) Pulse Rate: 118 (02/24 0600) Intake/Output from previous day: 02/23 0701 - 02/24 0700 In: 1302.5 [P.O.:360; I.V.:942.5] Out: 1100 [Urine:1100] Intake/Output from this shift: Total I/O In: 360 [P.O.:360] Out: -   Labs:  Recent Labs  03/02/13 0412 03/03/13 0503 03/03/13 1451 03/04/13 0413  WBC 15.0*  --  14.7* 11.4*  HGB 10.3*  --  9.5* 9.7*  PLT 187  --  174 174  CREATININE 2.95* 3.85*  --  2.33*   Estimated Creatinine Clearance: 20.7 ml/min (by C-G formula based on Cr of 2.33). No results found for this basename: Letta Median, VANCORANDOM, GENTTROUGH, GENTPEAK, GENTRANDOM, TOBRATROUGH, TOBRAPEAK, TOBRARND, AMIKACINPEAK, AMIKACINTROU, AMIKACIN,  in the last 72 hours   Microbiology: Recent Results (from the past 720 hour(s))  URINE CULTURE     Status: None   Collection Time    02/27/13  2:37 PM      Result Value Ref Range Status   Specimen Description URINE, CATHETERIZED   Final   Special Requests NONE   Final   Culture  Setup Time     Final   Value: 02/27/2013 16:36     Performed at [facility]     Final   Value: 95,000 COLONIES/ML     Performed at [facility]   Culture     Final   Value: STAPHYLOCOCCUS SPECIES (COAGULASE NEGATIVE)     Note: RIFAMPIN AND GENTAMICIN SHOULD NOT BE USED AS SINGLE DRUGS FOR TREATMENT OF STAPH INFECTIONS.     Performed at [facility]   Report Status 03/02/2013 FINAL   Final   Organism ID, Bacteria  STAPHYLOCOCCUS SPECIES (COAGULASE NEGATIVE)   Final  URINE CULTURE     Status: None   Collection Time    02/27/13 11:36 PM      Result Value Ref Range Status   Specimen Description URINE, CATHETERIZED   Final   Special Requests NONE   Final   Culture  Setup Time     Final   Value: 02/28/2013 05:57     Performed at [facility]     Final   Value: 40,000 COLONIES/ML     Performed at [facility]   Culture     Final   Value: STAPHYLOCOCCUS SPECIES (COAGULASE NEGATIVE)     Note: RIFAMPIN AND GENTAMICIN SHOULD NOT BE USED AS SINGLE DRUGS FOR TREATMENT OF STAPH INFECTIONS.     Performed at [facility]   Report Status 03/02/2013 FINAL   Final   Organism ID, Bacteria STAPHYLOCOCCUS SPECIES (COAGULASE NEGATIVE)   Final    Medical History: Past Medical History  Diagnosis Date  . Chronic atrial fibrillation   . Chronic anticoagulation     a. afib/st. jude avr - goal 2.5-3.5   . BPH (benign prostatic hypertrophy)   . Hypertension   . History of kidney stones   . Gout   . Syncope   . History of skin  cancer   . Bladder tumor     a. s/p resection 02/19/2013.  [name] Hx of scarlet fever   . Seizures 2014    evaluated by Dr. Jannifer Franklin at [facility] neurology -  no cause determined - refered back to cardiologist - recommended loop recorder to monitor rythm but pt refused - continues to have seizures per family - notes in EPIC  . Chronic combined systolic and diastolic CHF (congestive heart failure)     a. EF 45 to 50% per echo in 2011 following exacerbation    Medications:  Scheduled:  . carvedilol  6.25 mg Oral BID WC  . finasteride  5 mg Oral q morning - 10a  . furosemide  40 mg Intravenous BID  . simvastatin  10 mg Oral QHS  . sodium chloride  3 mL Intravenous Q12H  . terazosin  5 mg Oral QHS   Infusions:    Assessment: 64 yoM admitted 2/19 from home with urinary retention, constipation, confusion since Monday. Recently underwent a large bladder  tumor resection (multifocal papillary tumor) on 02/19/13 by Dr. [name] and was started on ciprofloxacin post-procedure. Pt states he has not urinated in 3 days. Patient with CNS in 2 urine cultures and per notes, after discussion with ID Md, to start IV vancomycin as inpatient with plan to switch to oral when discharge to complete total of 10 days of therapy  Scr improving at 2.33, CrCl 21  WBC 11.4, improving  afebrile  Goal of Therapy:  Vancomycin trough level 10-15 mcg/ml  Plan:  1) Vancomycin 500mg  IV q24 2) Follow renal function closely 3) Note that nitrofurantoin is contraindicated in this patient with a CrCl < 50 ml/min   Adrian Saran, PharmD, BCPS Pager [phone] 03/04/2013 12:34 PM

## 2013-03-04 NOTE — Progress Notes (Signed)
Patient ID: Eric Wheeler  male  NGE:[id number]    DOB: [birth year]    DOA: 02/27/2013  PCP: [name], NP  Assessment/Plan:  metabolic encephalopathy - improved - secondary to acute infection, UTI and urinary retention form obstructive uropathy   UTI (urinary tract infection)  - Urine culture reviewed, coagulase negative staph, sensitivities reviewed by Dr.[name] with Dr. [name]) - Patient was on Zosyn which was discontinued - Per Dr.[name], Dr. Graylon Good who recommended IV vancomycin, and DC on nitrofurantoin for total of 10 days course for UTI complicated with acute renal failure and bilateral hydroureteronephrosis   Acute renal failure/ Obstructive uropathy/ bilateral hydroureteronephrosis  - foley placed, Irrigated by urology, renal ultrasound ordered - was having hematuria till yesterday, urology held warfarin, INR 2.7 today -hematuria clearing -stop IVF today since volume overloaded -creatinine continues to improve, baseline 1.2  Chronic atrial fibrillation - Coumadin currently held due to hematuria - Cardiology following -rate up, suspect related to volume overload, diurese and monitor -continue coreg, ?increase dose, defer to cards, no good candidate for diltiazem due to cardiomyopathy  S/p AVR /mechanical -see above, coumadin currently on hold  Acute on Chronic systolic congestive heart failure  -volume overloaded on exam today, will stop IVF, diurese with IV lasix x2  - 2 D ECHO with  EF in 45%and diffuse hypokkinesis  -continue coreg  Bladder cancer  - per urology   Anemia of chronic disease  - secondary to history of bladder cancer   DVT Prophylaxis: SCD's, INR therapeutic   Code Status: Full Code   Family Communication: discussed with patient, wife and son in law at bedside   Disposition: pending improvement    Subjective: Complains of being short of breath for 1-2days, worse today Urine clear today  Objective: Weight change: 0.7 kg (1 lb 8.7  oz)  Intake/Output Summary (Last 24 hours) at 03/04/13 0953 Last data filed at 03/04/13 0800  Gross per 24 hour  Intake 1542.5 ml  Output   1100 ml  Net  442.5 ml   Blood pressure 143/69, pulse 118, temperature 97.7 F (36.5 C), temperature source Oral, resp. rate 20, height 5\' 9"  (1.753 m), weight 76 kg (167 lb 8.8 oz), SpO2 99.00%.  Physical Exam: General: Alert and awake, oriented x3, not in any acute distress. CVS: M0-N0 clear, metallic click no murmur rubs or gallops Chest: diminished BS at bases and fine crackles Abdomen: soft nontender,  mildly distended, normal bowel sounds  Extremities: 1 plus edema R>L  Lab Results: Basic Metabolic Panel:  Recent Labs Lab 02/27/13 2000  03/03/13 0503 03/04/13 0413  NA 137  < > 145 142  K 4.7  < > 4.5 4.0  CL 97  < > 109 107  CO2 20  < > 21 22  GLUCOSE 115*  < > 147* 113*  BUN 79*  < > 70* 57*  CREATININE 5.43*  < > 3.85* 2.33*  CALCIUM 7.9*  < > 8.6 8.2*  MG 2.0  --   --   --   PHOS 6.4*  --   --   --   < > = values in this interval not displayed. Liver Function Tests:  Recent Labs Lab 02/27/13 2000 02/28/13 0405  AST 11 9  ALT 12 11  ALKPHOS 79 63  BILITOT 0.4 0.4  PROT 6.5 5.9*  ALBUMIN 2.7* 2.5*   No results found for this basename: LIPASE, AMYLASE,  in the last 168 hours No results found for this basename: AMMONIA,  in the last 168 hours CBC:  Recent Labs Lab 02/27/13 2000  03/03/13 1451 03/04/13 0413  WBC 14.8*  < > 14.7* 11.4*  NEUTROABS 12.9*  --   --   --   HGB 9.7*  < > 9.5* 9.7*  HCT 29.7*  < > 30.3* 31.4*  MCV 90.0  < > 92.9 92.6  PLT 183  < > 174 174  < > = values in this interval not displayed. Cardiac Enzymes: No results found for this basename: CKTOTAL, CKMB, CKMBINDEX, TROPONINI,  in the last 168 hours BNP: No components found with this basename: POCBNP,  CBG:  Recent Labs Lab 02/28/13 0815 03/01/13 0726 03/02/13 0715 03/03/13 0757 03/04/13 0752  GLUCAP 110* 110* 125* 140* 117*      Micro Results: Recent Results (from the past 240 hour(s))  URINE CULTURE     Status: None   Collection Time    02/27/13  2:37 PM      Result Value Ref Range Status   Specimen Description URINE, CATHETERIZED   Final   Special Requests NONE   Final   Culture  Setup Time     Final   Value: 02/27/2013 16:36     Performed at [facility]     Final   Value: 95,000 COLONIES/ML     Performed at [facility]   Culture     Final   Value: STAPHYLOCOCCUS SPECIES (COAGULASE NEGATIVE)     Note: RIFAMPIN AND GENTAMICIN SHOULD NOT BE USED AS SINGLE DRUGS FOR TREATMENT OF STAPH INFECTIONS.     Performed at [facility]   Report Status 03/02/2013 FINAL   Final   Organism ID, Bacteria STAPHYLOCOCCUS SPECIES (COAGULASE NEGATIVE)   Final  URINE CULTURE     Status: None   Collection Time    02/27/13 11:36 PM      Result Value Ref Range Status   Specimen Description URINE, CATHETERIZED   Final   Special Requests NONE   Final   Culture  Setup Time     Final   Value: 02/28/2013 05:57     Performed at [facility]     Final   Value: 40,000 COLONIES/ML     Performed at [facility]   Culture     Final   Value: STAPHYLOCOCCUS SPECIES (COAGULASE NEGATIVE)     Note: RIFAMPIN AND GENTAMICIN SHOULD NOT BE USED AS SINGLE DRUGS FOR TREATMENT OF STAPH INFECTIONS.     Performed at [facility]   Report Status 03/02/2013 FINAL   Final   Organism ID, Bacteria STAPHYLOCOCCUS SPECIES (COAGULASE NEGATIVE)   Final    Studies/Results: Ct Abdomen Pelvis Wo Contrast  02/27/2013   CLINICAL DATA:  Anuria, history of bladder cancer with removal of urinary catheter 4 days ago.  EXAM: CT ABDOMEN AND PELVIS WITHOUT CONTRAST  TECHNIQUE: Multidetector CT imaging of the abdomen and pelvis was performed following the standard protocol without intravenous contrast.  COMPARISON:  DG ABD ACUTE W/CHEST dated 02/27/2013; CT ABD-PEL WO/W CM dated  12/31/2012  FINDINGS: Trace pleural effusions are noted.  Unenhanced CT was performed per clinician order. Lack of IV contrast limits sensitivity and specificity, especially for evaluation of abdominal/pelvic solid viscera. 1 cm lateral segment left hepatic lobe cyst is reidentified. Too small to characterize 5 mm lateral segment left hepatic lobe hypodense lesion image 9 is stable. Gallstones are noted within the decompressed otherwise normal-appearing gallbladder. Motion  artifact degrades imaging at multiple levels. Adrenal glands, spleen, and pancreas are grossly unremarkable.  There has been interval development of mild hydroureteronephrosis to the level of the bladder which is partly decompressed but demonstrates partly visualized wall thickening of at least 1 cm image 67. A Foley catheter is in place. At the right bladder apex, there is a 0.7 cm dominant lymph node as well as other similar-appearing enlarged supravesical lymph nodes for example image 57. 0.8 cm periaortic node identified image 40. Pericaval 0.7 cm lymph node identified image 51, not significantly changed. The supravesical nodes are increase in size since previously but the dominant periaortic node is stable in size. There is trace fluid tracking along the ureters tracking to the pelvis. Marked enlargement of the prostate is again noted. Bilateral small inguinal nodes are stable.  No bowel wall thickening or focal segmental dilatation. Moderate atheromatous aortic calcification without aneurysm.  The bones are subjectively osteopenic. Multilevel disc degenerative changes noted. No new lytic or sclerotic osseous lesion is identified. Lower thoracic loss of vertebral body height is reidentified.  IMPRESSION: Diffusely thick walled bladder with mild proximal bilateral hydroureteronephrosis. Given the history of bladder cancer, this may indicate tumor involvement although cystitis or posttreatment change could appear similar.  Retroperitoneal and  supravesical lymphadenopathy, with interval increase in size of the supravesical nodes which could be neoplastic or inflammatory.  New trace bilateral pleural effusions.   Electronically Signed   By: Conchita Paris M.D.   On: 02/27/2013 16:45   Dg Abd Acute W/chest  02/27/2013   CLINICAL DATA:  Abdominal distention. Unable to void. Bladder cancer.  EXAM: ACUTE ABDOMEN SERIES (ABDOMEN 2 VIEW & CHEST 1 VIEW)  COMPARISON:  Two-view chest x-ray 03/29/2012. CT of the abdomen and pelvis 12/31/2012.  FINDINGS: Heart is enlarged. Chronic interstitial coarsening is evident. No focal airspace disease is evident.  There is mild distention of the transverse colon. The transition point appears to be just beyond the splenic flexure. No free air is evident. The SI joints are fused bilaterally with findings suggesting ankylosing spondylitis.  IMPRESSION: 1. Mild distention of transverse and proximal colon with a transition point just beyond the and splenic flexure. This raises the possibility of diverticulitis. 2. Stable cardiomegaly without failure. 3. Fusion of the SI joints bilaterally and findings suggesting ankylosing spondylitis.   Electronically Signed   By: Lawrence Santiago M.D.   On: 02/27/2013 14:24    Medications: Scheduled Meds: . carvedilol  6.25 mg Oral BID WC  . finasteride  5 mg Oral q morning - 10a  . furosemide  40 mg Intravenous BID  . simvastatin  10 mg Oral QHS  . sodium chloride  3 mL Intravenous Q12H  . terazosin  5 mg Oral QHS   Time spent in coordinating care, discussing with family and encounter: 50 mins   LOS: 5 days   Chanese Hartsough M.D. [facility] 03/04/2013, 9:53 AM Pager: [phone] If 7PM-7AM, please contact night-coverage [email or web address] Password [email or web address]

## 2013-03-05 DIAGNOSIS — R319 Hematuria, unspecified: Secondary | ICD-10-CM

## 2013-03-05 DIAGNOSIS — D72829 Elevated white blood cell count, unspecified: Secondary | ICD-10-CM

## 2013-03-05 DIAGNOSIS — I5032 Chronic diastolic (congestive) heart failure: Secondary | ICD-10-CM

## 2013-03-05 DIAGNOSIS — I509 Heart failure, unspecified: Secondary | ICD-10-CM

## 2013-03-05 LAB — CBC
HCT: 33.8 % — ABNORMAL LOW (ref 39.0–52.0)
Hemoglobin: 10.5 g/dL — ABNORMAL LOW (ref 13.0–17.0)
MCH: 28.7 pg (ref 26.0–34.0)
MCHC: 31.1 g/dL (ref 30.0–36.0)
MCV: 92.3 fL (ref 78.0–100.0)
PLATELETS: 215 10*3/uL (ref 150–400)
RBC: 3.66 MIL/uL — ABNORMAL LOW (ref 4.22–5.81)
RDW: 15.2 % (ref 11.5–15.5)
WBC: 12.9 10*3/uL — ABNORMAL HIGH (ref 4.0–10.5)

## 2013-03-05 LAB — BASIC METABOLIC PANEL
BUN: 42 mg/dL — ABNORMAL HIGH (ref 6–23)
CALCIUM: 8.3 mg/dL — AB (ref 8.4–10.5)
CO2: 27 mEq/L (ref 19–32)
Chloride: 103 mEq/L (ref 96–112)
Creatinine, Ser: 1.84 mg/dL — ABNORMAL HIGH (ref 0.50–1.35)
GFR calc Af Amer: 35 mL/min — ABNORMAL LOW (ref >90)
GFR, EST NON AFRICAN AMERICAN: 30 mL/min — AB (ref >90)
GLUCOSE: 128 mg/dL — AB (ref 70–99)
Potassium: 3.4 mEq/L — ABNORMAL LOW (ref 3.7–5.3)
Sodium: 144 mEq/L (ref 137–147)

## 2013-03-05 LAB — PROTIME-INR
INR: 1.93 — AB (ref 0.00–1.49)
Prothrombin Time: 21.5 seconds — ABNORMAL HIGH (ref 11.6–15.2)

## 2013-03-05 LAB — GLUCOSE, CAPILLARY: GLUCOSE-CAPILLARY: 122 mg/dL — AB (ref 70–99)

## 2013-03-05 MED ORDER — FUROSEMIDE 10 MG/ML IJ SOLN
40.0000 mg | Freq: Once | INTRAMUSCULAR | Status: AC
  Administered 2013-03-05: 40 mg via INTRAVENOUS

## 2013-03-05 MED ORDER — WARFARIN SODIUM 2.5 MG PO TABS
2.5000 mg | ORAL_TABLET | Freq: Once | ORAL | Status: AC
  Administered 2013-03-05: 2.5 mg via ORAL
  Filled 2013-03-05: qty 1

## 2013-03-05 MED ORDER — WARFARIN - PHARMACIST DOSING INPATIENT: Freq: Every day | Status: DC

## 2013-03-05 NOTE — Progress Notes (Signed)
ANTICOAGULATION CONSULT NOTE - Initial Consult  Pharmacy Consult for Warfarin Indication: Afib  Allergies  Allergen Reactions  . Ace Inhibitors Other (See Comments)    Dizziness   . Codeine Nausea And Vomiting  . Sulfa Drugs Cross Reactors Nausea And Vomiting    Patient Measurements: Height: 5\' 9"  (175.3 cm) Weight: 157 lb 10.1 oz (71.5 kg) IBW/kg (Calculated) : 70.7 Heparin Dosing Weight:   Vital Signs: Temp: 97.9 F (36.6 C) (02/25 0524) Temp src: Oral (02/25 0524) BP: 125/66 mmHg (02/25 0524) Pulse Rate: 80 (02/25 0524)  Labs:  Recent Labs  03/03/13 0503  03/03/13 1451 03/04/13 0413 03/05/13 0438  HGB  --   < > 9.5* 9.7* 10.5*  HCT  --   --  30.3* 31.4* 33.8*  PLT  --   --  174 174 215  LABPROT 30.4*  --   --  27.5* 21.5*  INR 3.04*  --   --  2.67* 1.93*  CREATININE 3.85*  --   --  2.33* 1.84*  < > = values in this interval not displayed.  Estimated Creatinine Clearance: 26.1 ml/min (by C-G formula based on Cr of 1.84).   Medical History: Past Medical History  Diagnosis Date  . Chronic atrial fibrillation   . Chronic anticoagulation     a. afib/st. jude avr - goal 2.5-3.5   . BPH (benign prostatic hypertrophy)   . Hypertension   . History of kidney stones   . Gout   . Syncope   . History of skin cancer   . Bladder tumor     a. s/p resection 02/19/2013.  [name] Hx of scarlet fever   . Seizures 2014    evaluated by Dr. Jannifer Franklin at [facility] neurology -  no cause determined - refered back to cardiologist - recommended loop recorder to monitor rythm but pt refused - continues to have seizures per family - notes in EPIC  . Chronic combined systolic and diastolic CHF (congestive heart failure)     a. EF 45 to 50% per echo in 2011 following exacerbation   Assessment: 82 yoM admitted 2/19 with UTI and ARF. Patient is well known to pharmacy from antibiotic dosing. Pt on on chronic warfarin PTA for atrial fibrillation with AVR mechanical valve, followed by [facility]  anti-coagulation clinic. Warfarin held been held recently for urologic procedure, restarted 2/11. Home dose 2.5mg  daily except 5mg  every 3rd day. Last dose taken 2/18. INR on admission within therapeutic range at 2.38.  Warfarin initially resumed, however held since 2/22 d/t hematuria.  Today, pharmacy consulted to resume coumadin again.    Inpatient warfarin doses: 2.5 mg (2/19), 2.5 mg (2/20), 1 mg (2/21), none 2/22 and 2/23  2/25: No further hematuria, CBC stable. INR 1.93, now slightly subtherapeutic.  Appetite better.  Renal function improving. No major DDIs noted. Would AVOID bactrim if abx narrowed to PO.     Goal of Therapy:  INR 2-3 Monitor platelets by anticoagulation protocol: Yes   Plan:  Coumadin 2.5mg  po x 1 tonight.  F/u daily PT/INR  Ralene Bathe, PharmD, BCPS 03/05/2013, 10:49 AM  Pager: [phone]

## 2013-03-05 NOTE — Evaluation (Signed)
Occupational Therapy Evaluation Patient Details Name: Eric Wheeler MRN: [id number] DOB: 1919-08-14 Today's Date: 03/05/2013 Time: 0865-7846 OT Time Calculation (min): 13 min  OT Assessment / Plan / Recommendation History of present illness     Per chart: 80 year old male with a history of A. fib, hypertension, diastolic and systolic CHF, St. Jude's AVR on coumadin who recently underwent a large bladder tumor resection (multifocal papillary tumor) on 02/19/13 by Dr. [name] who now presented to [facility] ED after found to be more confused at home and initially noted three days prior to this admission. Daughter at bedside provided most of the details and explains she has also noted urinary retention since the foley was removed three days prior to this admission and and nearly no urine output since that time. This has also been associated with nausea and several episodes of non bloody vomiting, poor oral intake, malaise. Pt denies chest pain or shortness of breath.    Clinical Impression   Pt was admitted with acute encephalopathy which is resolving. He also has a uti.  Pt will benefit from skilled OT to increase safety and independence with adls.  Pt was mod I prior to admission, goals in acute are for supervision level overall.      OT Assessment  Patient needs continued OT Services    Follow Up Recommendations  No OT follow up (likely)    Barriers to Discharge      Equipment Recommendations   (further assess for 3:1 vs. none) Pt will likely benefit from this   Recommendations for Other Services    Frequency  Min 2X/week    Precautions / Restrictions Precautions Precautions: Fall Restrictions Weight Bearing Restrictions: Yes   Pertinent Vitals/Pain No c/o pain    ADL  Grooming: Set up Where Assessed - Grooming: Unsupported sitting Upper Body Bathing: Set up Where Assessed - Upper Body Bathing: Unsupported sitting Lower Body Bathing: Minimal assistance Where Assessed - Lower Body  Bathing: Supported sit to stand Upper Body Dressing: Set up Where Assessed - Upper Body Dressing: Unsupported sitting Lower Body Dressing: Minimal assistance Where Assessed - Lower Body Dressing: Supported sit to stand Toilet Transfer: Water engineer Method: Sit to stand Toileting - Water quality scientist and Hygiene: Min guard Where Assessed - Best boy and Hygiene: Sit to stand from 3-in-1 or toilet Equipment Used: Rolling walker Transfers/Ambulation Related to ADLs: pt ambulated to bathroom with RW but did not use toilet.  Min guard for safety. R leg lags behind ADL Comments: Pt is able to comfortably reach to feet--min A to wash bottom of feet and assist with socks.      OT Diagnosis: Generalized weakness  OT Problem List: Decreased strength;Decreased activity tolerance;Impaired balance (sitting and/or standing);Decreased knowledge of use of DME or AE OT Treatment Interventions: Self-care/ADL training;DME and/or AE instruction;Patient/family education;Balance training   OT Goals(Current goals can be found in the care plan section) Acute Rehab OT Goals Patient Stated Goal: home OT Goal Formulation: With patient Time For Goal Achievement: 03/19/13 Potential to Achieve Goals: Good ADL Goals Pt Will Perform Grooming: with supervision;standing Pt Will Transfer to Toilet: with supervision;bedside commode;ambulating;regular height toilet (vs) Pt Will Perform Toileting - Clothing Manipulation and hygiene: with supervision;sit to/from stand  Visit Information  Last OT Received On: 03/05/13 Assistance Needed: +1 History of Present Illness: 80 year old male with a history of A. fib, hypertension, diastolic and systolic CHF, St. Jude's AVR on coumadin who recently underwent a large bladder tumor resection (multifocal papillary tumor)  on 02/19/13 by Dr. [name] who now presented to [facility] ED after found to be more confused at home and initially noted three  days prior to this admission. Daughter at bedside provided most of the details and explains she has also noted urinary retention since the foley was removed three days prior to this admission and and nearly no urine output since that time. This has also been associated with nausea and several episodes of non bloody vomiting, poor oral intake, malaise. Pt denies chest pain or shortness of breath.        Prior [place] expects to be discharged to:: Private residence Living Arrangements: Spouse/significant other Home Equipment: Kasandra Knudsen - single point;Walker - 2 wheels Additional Comments: pt does not have a 3:1 commode Prior Function Level of Independence: Independent with assistive device(s) Communication Communication: HOH         Vision/Perception     Cognition  Cognition Arousal/Alertness: Awake/alert Behavior During Therapy: WFL for tasks assessed/performed Overall Cognitive Status: Within Functional Limits for tasks assessed    Extremity/Trunk Assessment Upper Extremity Assessment Upper Extremity Assessment: Overall WFL for tasks assessed     Mobility Bed Mobility Overal bed mobility: Needs Assistance Bed Mobility: Sidelying to Sit Sidelying to sit: Min assist Transfers Sit to Stand: Min guard;From elevated surface     Exercise     Balance     End of Session OT - End of Session Activity Tolerance: Patient tolerated treatment well Patient left: in bed;with call bell/phone within reach  [place] 03/05/2013, 4:00 PM Lesle Chris, OTR/L [phone] 03/05/2013

## 2013-03-05 NOTE — Progress Notes (Signed)
Patient ID: [name], male   DOB: 01-17-1919, 80 y.o.   MRN: [id number]  [name] is without complaints and is asking about discharge.   His urine remains clear.   Cr. Is 1.83 and INR is 1.96.  IMP:  Hematuria has resolved.         ARI is improving.          Staph UTI being treated.  REC:  D/C home when ok with medicine.            He will need the foley for a couple of weeks.            Pharmacy has recommended doxycycline over nitrofurantoin post discharge because of the ARI.

## 2013-03-05 NOTE — Progress Notes (Signed)
    Subjective:  No new complaints.  No hematuria Mild SOB. Takes lasix.   Objective:  Vital Signs in the last 24 hours: Temp:  [97.9 F (36.6 C)-98.1 F (36.7 C)] 97.9 F (36.6 C) (02/25 0524) Pulse Rate:  [80-110] 80 (02/25 0524) Resp:  [18] 18 (02/25 0524) BP: (125-137)/(66-78) 125/66 mmHg (02/25 0524) SpO2:  [97 %-98 %] 97 % (02/25 0524) Weight:  [157 lb 10.1 oz (71.5 kg)] 157 lb 10.1 oz (71.5 kg) (02/25 0525)  Intake/Output from previous day: 02/24 0701 - 02/25 0700 In: 1180 [P.O.:1080; IV Piggyback:100] Out: 5975 [Urine:5975]   Physical Exam: General: Elderly, in no acute distress. Head:  Normocephalic and atraumatic. Lungs: Clear to auscultation and percussion. Heart: Mildly tachycardic, irreg irreg  No murmur, rubs or gallops.  Abdomen: soft, non-tender, positive bowel sounds. Extremities: No clubbing or cyanosis. No edema. Neurologic: Alert and oriented x 3.    Lab Results:  Recent Labs  03/04/13 0413 03/05/13 0438  WBC 11.4* 12.9*  HGB 9.7* 10.5*  PLT 174 215    Recent Labs  03/04/13 0413 03/05/13 0438  NA 142 144  K 4.0 3.4*  CL 107 103  CO2 22 27  GLUCOSE 113* 128*  BUN 57* 42*  CREATININE 2.33* 1.84*   Imaging: US Renal  03/03/2013   CLINICAL DATA:  Bladder cancer.  Hematuria.  EXAM: RENAL/URINARY TRACT ULTRASOUND COMPLETE  COMPARISON:  02/27/2013 CT.  FINDINGS: Right Kidney:  Length: 10.3 cm. Echogenicity within normal limits. No mass or hydronephrosis visualized.  Left Kidney:  Length: 10.3 cm. Echogenicity within normal limits. No mass or hydronephrosis visualized.  Bladder:  Foley catheter in place. Debris within the bladder. Bladder is partially decompressed. Limited for detection of bladder mass.  IMPRESSION: No hydronephrosis or renal mass identified.  Decompressed bladder (Foley catheter in place) contains debris. Limited for evaluating for bladder mass.   Electronically Signed   By: Chauncey Cruel M.D.   On: 03/03/2013 15:44    Telemetry: AFIB 90-110 Personally viewed.     Assessment/Plan:  Principal Problem:   Acute encephalopathy Active Problems:   Chronic atrial fibrillation   Chronic diastolic congestive heart failure- last EF 45-50%   S/P AVR (aortic valve replacement)   Bladder cancer- s/p surgery 02/20/13   UTI (urinary tract infection)   Anemia of chronic disease   Hydronephrosis   Uropathy, obstructive   Acute renal failure   Chronic anticoagulation   Hematuria- Coumadin on hold  1) AFIB  - reasonable rate control  2) AVR mechanical  - Restart coumadin. I will write order.   3) Dyspnea  - Monitor. Creat is improving. Could give dose of IV lasix if felt needed during the day.    Inas Avena, [place] 03/05/2013, 9:20 AM

## 2013-03-05 NOTE — Progress Notes (Signed)
Patient ID: Eric Wheeler  male  YBO:[id number]    DOB: [birth year]    DOA: 02/27/2013  PCP: [name], NP  Assessment/Plan:  metabolic encephalopathy - improved - secondary to acute infection, UTI and urinary retention form obstructive uropathy   UTI (urinary tract infection)  - Urine culture reviewed, coagulase negative staph, sensitivities reviewed by Dr.[name] with Dr. [name]) - Patient was on Zosyn which was discontinued - Per Dr.[name], Dr. Graylon Good who recommended IV vancomycin, and DC on nitrofurantoin for total of 10 days course for UTI complicated with acute renal failure and bilateral hydroureteronephrosis  Pharmacy recommending doxycycline over nitrofurantoin post discharge because of the AKI   Acute renal failure/ Obstructive uropathy/ bilateral hydroureteronephrosis  - foley placed, Irrigated by urology, renal ultrasound ordered - was having hematuria till yesterday, urology held warfarin, INR 2.7 today -hematuria clearing -stopped IVF today since volume overloaded -creatinine continues to improve, baseline 1.2 - Will need foley catheter for 2 more weeks  Chronic atrial fibrillation - Coumadin currently held due to hematuria - Cardiology following -rate up, suspect related to volume overload, Was given lasix yesterday with good urine output, will repeat one more dose of lasix. -continue coreg, ?increase dose, defer to cards, no good candidate for diltiazem due to cardiomyopathy  S/p AVR /mechanical -Coumadin restarted per cardiology   Acute on Chronic systolic congestive heart failure  -Was volume overloaded on exam yesterday, will stoped IVF, diurese with IV lasix x2  Still has mild dyspnea, will give one more dose of IV lasix - 2 D ECHO with  EF in 45%and diffuse hypokkinesis  -continue coreg  Bladder cancer  - per urology   Anemia of chronic disease  - secondary to history of bladder cancer   DVT Prophylaxis: SCD's, INR therapeutic   Code Status: Full  Code   Family Communication: discussed with patient, wife and son in law at bedside   Disposition: pending improvement    Subjective: Patient's breathing has improved. Urine continues to be clear  Objective: Weight change: -4.5 kg (-9 lb 14.7 oz)  Intake/Output Summary (Last 24 hours) at 03/05/13 1603 Last data filed at 03/05/13 1300  Gross per 24 hour  Intake    960 ml  Output   5225 ml  Net  -4265 ml   Blood pressure 126/74, pulse 122, temperature 97.5 F (36.4 C), temperature source Oral, resp. rate 20, height 5\' 9"  (1.753 m), weight 71.5 kg (157 lb 10.1 oz), SpO2 98.00%.  Physical Exam: Physical Exam: Head: Normocephalic, atraumatic.  Eyes: No signs of jaundice, EOMI Nose: Mucous membranes dry.  Throat: Oropharynx nonerythematous, no exudate appreciated.  Neck: supple,No deformities, masses, or tenderness noted. Lungs: Normal respiratory effort. B/L Clear to auscultation, no crackles or wheezes.  Heart: Regular RR. S1 and S2 normal  Abdomen: BS normoactive. Soft, Nondistended, non-tender.  Extremities: Trace edema bilaterally   Lab Results: Basic Metabolic Panel:  Recent Labs Lab 02/27/13 2000  03/04/13 0413 03/05/13 0438  NA 137  < > 142 144  K 4.7  < > 4.0 3.4*  CL 97  < > 107 103  CO2 20  < > 22 27  GLUCOSE 115*  < > 113* 128*  BUN 79*  < > 57* 42*  CREATININE 5.43*  < > 2.33* 1.84*  CALCIUM 7.9*  < > 8.2* 8.3*  MG 2.0  --   --   --   PHOS 6.4*  --   --   --   < > =  values in this interval not displayed. Liver Function Tests:  Recent Labs Lab 02/27/13 2000 02/28/13 0405  AST 11 9  ALT 12 11  ALKPHOS 79 63  BILITOT 0.4 0.4  PROT 6.5 5.9*  ALBUMIN 2.7* 2.5*   No results found for this basename: LIPASE, AMYLASE,  in the last 168 hours No results found for this basename: AMMONIA,  in the last 168 hours CBC:  Recent Labs Lab 02/27/13 2000  03/04/13 0413 03/05/13 0438  WBC 14.8*  < > 11.4* 12.9*  NEUTROABS 12.9*  --   --   --   HGB 9.7*   < > 9.7* 10.5*  HCT 29.7*  < > 31.4* 33.8*  MCV 90.0  < > 92.6 92.3  PLT 183  < > 174 215  < > = values in this interval not displayed. Cardiac Enzymes: No results found for this basename: CKTOTAL, CKMB, CKMBINDEX, TROPONINI,  in the last 168 hours BNP: No components found with this basename: POCBNP,  CBG:  Recent Labs Lab 03/01/13 0726 03/02/13 0715 03/03/13 0757 03/04/13 0752 03/05/13 0736  GLUCAP 110* 125* 140* 117* 122*     Micro Results: Recent Results (from the past 240 hour(s))  URINE CULTURE     Status: None   Collection Time    02/27/13  2:37 PM      Result Value Ref Range Status   Specimen Description URINE, CATHETERIZED   Final   Special Requests NONE   Final   Culture  Setup Time     Final   Value: 02/27/2013 16:36     Performed at [facility]     Final   Value: 95,000 COLONIES/ML     Performed at [facility]   Culture     Final   Value: STAPHYLOCOCCUS SPECIES (COAGULASE NEGATIVE)     Note: RIFAMPIN AND GENTAMICIN SHOULD NOT BE USED AS SINGLE DRUGS FOR TREATMENT OF STAPH INFECTIONS.     Performed at [facility]   Report Status 03/02/2013 FINAL   Final   Organism ID, Bacteria STAPHYLOCOCCUS SPECIES (COAGULASE NEGATIVE)   Final  URINE CULTURE     Status: None   Collection Time    02/27/13 11:36 PM      Result Value Ref Range Status   Specimen Description URINE, CATHETERIZED   Final   Special Requests NONE   Final   Culture  Setup Time     Final   Value: 02/28/2013 05:57     Performed at [facility]     Final   Value: 40,000 COLONIES/ML     Performed at [facility]   Culture     Final   Value: STAPHYLOCOCCUS SPECIES (COAGULASE NEGATIVE)     Note: RIFAMPIN AND GENTAMICIN SHOULD NOT BE USED AS SINGLE DRUGS FOR TREATMENT OF STAPH INFECTIONS.     Performed at [facility]   Report Status 03/02/2013 FINAL   Final   Organism ID, Bacteria STAPHYLOCOCCUS SPECIES (COAGULASE  NEGATIVE)   Final    Studies/Results: Ct Abdomen Pelvis Wo Contrast  02/27/2013   CLINICAL DATA:  Anuria, history of bladder cancer with removal of urinary catheter 4 days ago.  EXAM: CT ABDOMEN AND PELVIS WITHOUT CONTRAST  TECHNIQUE: Multidetector CT imaging of the abdomen and pelvis was performed following the standard protocol without intravenous contrast.  COMPARISON:  DG ABD ACUTE W/CHEST dated 02/27/2013; CT ABD-PEL WO/W CM dated 12/31/2012  FINDINGS:  Trace pleural effusions are noted.  Unenhanced CT was performed per clinician order. Lack of IV contrast limits sensitivity and specificity, especially for evaluation of abdominal/pelvic solid viscera. 1 cm lateral segment left hepatic lobe cyst is reidentified. Too small to characterize 5 mm lateral segment left hepatic lobe hypodense lesion image 9 is stable. Gallstones are noted within the decompressed otherwise normal-appearing gallbladder. Motion artifact degrades imaging at multiple levels. Adrenal glands, spleen, and pancreas are grossly unremarkable.  There has been interval development of mild hydroureteronephrosis to the level of the bladder which is partly decompressed but demonstrates partly visualized wall thickening of at least 1 cm image 67. A Foley catheter is in place. At the right bladder apex, there is a 0.7 cm dominant lymph node as well as other similar-appearing enlarged supravesical lymph nodes for example image 57. 0.8 cm periaortic node identified image 40. Pericaval 0.7 cm lymph node identified image 51, not significantly changed. The supravesical nodes are increase in size since previously but the dominant periaortic node is stable in size. There is trace fluid tracking along the ureters tracking to the pelvis. Marked enlargement of the prostate is again noted. Bilateral small inguinal nodes are stable.  No bowel wall thickening or focal segmental dilatation. Moderate atheromatous aortic calcification without aneurysm.  The bones  are subjectively osteopenic. Multilevel disc degenerative changes noted. No new lytic or sclerotic osseous lesion is identified. Lower thoracic loss of vertebral body height is reidentified.  IMPRESSION: Diffusely thick walled bladder with mild proximal bilateral hydroureteronephrosis. Given the history of bladder cancer, this may indicate tumor involvement although cystitis or posttreatment change could appear similar.  Retroperitoneal and supravesical lymphadenopathy, with interval increase in size of the supravesical nodes which could be neoplastic or inflammatory.  New trace bilateral pleural effusions.   Electronically Signed   By: Christiana Pellant M.D.   On: 02/27/2013 16:45   Dg Abd Acute W/chest  02/27/2013   CLINICAL DATA:  Abdominal distention. Unable to void. Bladder cancer.  EXAM: ACUTE ABDOMEN SERIES (ABDOMEN 2 VIEW & CHEST 1 VIEW)  COMPARISON:  Two-view chest x-ray 03/29/2012. CT of the abdomen and pelvis 12/31/2012.  FINDINGS: Heart is enlarged. Chronic interstitial coarsening is evident. No focal airspace disease is evident.  There is mild distention of the transverse colon. The transition point appears to be just beyond the splenic flexure. No free air is evident. The SI joints are fused bilaterally with findings suggesting ankylosing spondylitis.  IMPRESSION: 1. Mild distention of transverse and proximal colon with a transition point just beyond the and splenic flexure. This raises the possibility of diverticulitis. 2. Stable cardiomegaly without failure. 3. Fusion of the SI joints bilaterally and findings suggesting ankylosing spondylitis.   Electronically Signed   By: Gennette Pac M.D.   On: 02/27/2013 14:24    Medications: Scheduled Meds: . carvedilol  6.25 mg Oral BID WC  . finasteride  5 mg Oral q morning - 10a  . simvastatin  10 mg Oral QHS  . sodium chloride  3 mL Intravenous Q12H  . terazosin  5 mg Oral QHS  . vancomycin  500 mg Intravenous Q24H  . warfarin  2.5 mg Oral  ONCE-1800  . Warfarin - Pharmacist Dosing Inpatient   Does not apply q1800   Time spent in coordinating care, discussing with family and encounter: 25 mins   LOS: 6 days   [facility] S M.D. [facility] 03/05/2013, 4:03 PM Pager: [phone] If 7PM-7AM, please contact night-coverage [email or web address] Password [email or web address]

## 2013-03-05 NOTE — Evaluation (Addendum)
Physical Therapy Evaluation Patient Details Name: Eric Wheeler MRN: [id number] DOB: 16-Nov-1919 Today's Date: 03/05/2013 Time: 1040-1100 PT Time Calculation (min): 20 min  PT Assessment / Plan / Recommendation History of Present Illness  80 year old male with a history of A. fib, hypertension, diastolic and systolic CHF, St. Jude's AVR on coumadin who recently underwent a large bladder tumor resection (multifocal papillary tumor) on 02/19/13 by Dr. [name] who now presented to [facility] ED after found to be more confused at home and initially noted three days prior to this admission. Daughter at bedside provided most of the details and explains she has also noted urinary retention since the foley was removed three days prior to this admission and and nearly no urine output since that time. This has also been associated with nausea and several episodes of non bloody vomiting, poor oral intake, malaise. Pt denies chest pain or shortness of breath.   Clinical Impression  Pt appears less steady than last eval on 2/20. Pt will benefit from PT to address balance and safety to DC to home. Recommend HHPT. Pt may be safer with RW. Will use it next visit.     PT Assessment  Patient needs continued PT services    Follow Up Recommendations  Home health PT    Does the patient have the potential to tolerate intense rehabilitation      Barriers to Discharge        Equipment Recommendations  None recommended by PT    Recommendations for Other Services     Frequency Min 3X/week    Precautions / Restrictions Precautions Precautions: Fall   Pertinent Vitals/Pain C/o L knee -"gout"      Mobility  Bed Mobility Overal bed mobility: Modified Independent Transfers Overall transfer level: Needs assistance Equipment used: Straight cane Sit to Stand: Min guard;From elevated surface General transfer comment: multiple attempts to stand from low surface Ambulation/Gait Ambulation/Gait assistance: Min  assist;Min guard Ambulation Distance (Feet): 400 Feet Assistive device: Straight cane Gait Pattern/deviations: Step-through pattern;Staggering left;Staggering right;Trunk flexed;Decreased stride length Gait velocity: decr General Gait Details: pt   required external support after LOB, eespecially when turn s to look side to side.    Exercises     PT Diagnosis: Difficulty walking;Generalized weakness  PT Problem List: Decreased activity tolerance;Decreased balance;Decreased mobility;Decreased safety awareness PT Treatment Interventions: DME instruction;Gait training;Functional mobility training;Therapeutic activities;Therapeutic exercise;Patient/family education;Balance training     PT Goals(Current goals can be found in the care plan section) Acute Rehab PT Goals Patient Stated Goal: I want to go home soon PT Goal Formulation: With patient/family Time For Goal Achievement: 03/19/13 Potential to Achieve Goals: Good  Visit Information  Last PT Received On: 03/05/13 Assistance Needed: +1 History of Present Illness: 80 year old male with a history of A. fib, hypertension, diastolic and systolic CHF, St. Jude's AVR on coumadin who recently underwent a large bladder tumor resection (multifocal papillary tumor) on 02/19/13 by Dr. [name] who now presented to [facility] ED after found to be more confused at home and initially noted three days prior to this admission. Daughter at bedside provided most of the details and explains she has also noted urinary retention since the foley was removed three days prior to this admission and and nearly no urine output since that time. This has also been associated with nausea and several episodes of non bloody vomiting, poor oral intake, malaise. Pt denies chest pain or shortness of breath.        Prior Functioning  Home Living Family/patient  expects to be discharged to:: Private residence Living Arrangements: Spouse/significant other Available Help at Discharge:  Family Type of Home: House Home Access: Stairs to enter [employer] of Steps: 1 Home Layout: One [place] - single point;Walker - 2 wheels Prior Function Level of Independence: Independent with assistive device(s) Communication Communication: HOH    Cognition  Cognition Arousal/Alertness: Awake/alert Behavior During Therapy: WFL for tasks assessed/performed Overall Cognitive Status: Within Functional Limits for tasks assessed    Extremity/Trunk Assessment Upper Extremity Assessment Upper Extremity Assessment: Defer to OT evaluation Lower Extremity Assessment Lower Extremity Assessment: Overall WFL for tasks assessed Cervical / Trunk Assessment Cervical / Trunk Assessment: Kyphotic   Balance Balance Overall balance assessment: Needs assistance Sitting-balance support: No upper extremity supported;Feet supported Sitting balance-Leahy Scale: Good Standing balance support: Single extremity supported;During functional activity Standing balance-Leahy Scale: Fair  End of Session PT - End of Session Equipment Utilized During Treatment: Gait belt Activity Tolerance: Patient tolerated treatment well Patient left: in bed;with call bell/phone within reach;with family/visitor present Nurse Communication: Mobility status  GP     Claretha Cooper 03/05/2013, 11:21 AM Tresa Endo PT [phone]

## 2013-03-06 LAB — GLUCOSE, CAPILLARY: Glucose-Capillary: 110 mg/dL — ABNORMAL HIGH (ref 70–99)

## 2013-03-06 LAB — BASIC METABOLIC PANEL
BUN: 35 mg/dL — AB (ref 6–23)
CALCIUM: 8.2 mg/dL — AB (ref 8.4–10.5)
CO2: 28 mEq/L (ref 19–32)
Chloride: 98 mEq/L (ref 96–112)
Creatinine, Ser: 1.64 mg/dL — ABNORMAL HIGH (ref 0.50–1.35)
GFR calc Af Amer: 41 mL/min — ABNORMAL LOW (ref >90)
GFR, EST NON AFRICAN AMERICAN: 35 mL/min — AB (ref >90)
GLUCOSE: 163 mg/dL — AB (ref 70–99)
Potassium: 2.9 mEq/L — CL (ref 3.7–5.3)
SODIUM: 140 meq/L (ref 137–147)

## 2013-03-06 LAB — PROTIME-INR
INR: 1.85 — ABNORMAL HIGH (ref 0.00–1.49)
PROTHROMBIN TIME: 20.8 s — AB (ref 11.6–15.2)

## 2013-03-06 MED ORDER — WARFARIN SODIUM 2.5 MG PO TABS
2.5000 mg | ORAL_TABLET | Freq: Once | ORAL | Status: DC
Start: 2013-03-06 — End: 2013-03-06
  Filled 2013-03-06: qty 1

## 2013-03-06 MED ORDER — DOXYCYCLINE HYCLATE 100 MG PO CAPS: 100.0000 mg | ORAL_CAPSULE | Freq: Two times a day (BID) | ORAL | Status: DC

## 2013-03-06 MED ORDER — POTASSIUM CHLORIDE CRYS ER 20 MEQ PO TBCR
30.0000 meq | EXTENDED_RELEASE_TABLET | ORAL | Status: AC
  Administered 2013-03-06 (×2): 30 meq via ORAL
  Filled 2013-03-06 (×3): qty 1

## 2013-03-06 NOTE — Progress Notes (Signed)
Pt states he will not need Home Health.

## 2013-03-06 NOTE — Progress Notes (Signed)
ANTICOAGULATION CONSULT NOTE - Follow-up Consult  Pharmacy Consult for Warfarin Indication: Afib  Allergies  Allergen Reactions  . Ace Inhibitors Other (See Comments)    Dizziness   . Codeine Nausea And Vomiting  . Sulfa Drugs Cross Reactors Nausea And Vomiting    Patient Measurements: Height: 5\' 9"  (175.3 cm) Weight: 155 lb 6.8 oz (70.5 kg) IBW/kg (Calculated) : 70.7 Heparin Dosing Weight:   Vital Signs: Temp: 97.8 F (36.6 C) (02/26 0505) Temp src: Oral (02/26 0505) BP: 103/66 mmHg (02/26 0742) Pulse Rate: 118 (02/26 0742)  Labs:  Recent Labs  03/03/13 1451 03/04/13 0413 03/05/13 0438 03/06/13 0350  HGB 9.5* 9.7* 10.5*  --   HCT 30.3* 31.4* 33.8*  --   PLT 174 174 215  --   LABPROT  --  27.5* 21.5* 20.8*  INR  --  2.67* 1.93* 1.85*  CREATININE  --  2.33* 1.84* 1.64*    Estimated Creatinine Clearance: 29.3 ml/min (by C-G formula based on Cr of 1.64).   Medical History: Past Medical History  Diagnosis Date  . Chronic atrial fibrillation   . Chronic anticoagulation     a. afib/st. jude avr - goal 2.5-3.5   . BPH (benign prostatic hypertrophy)   . Hypertension   . History of kidney stones   . Gout   . Syncope   . History of skin cancer   . Bladder tumor     a. s/p resection 02/19/2013.  [name] Hx of scarlet fever   . Seizures 2014    evaluated by Dr. Jannifer Franklin at [facility] neurology -  no cause determined - refered back to cardiologist - recommended loop recorder to monitor rythm but pt refused - continues to have seizures per family - notes in EPIC  . Chronic combined systolic and diastolic CHF (congestive heart failure)     a. EF 45 to 50% per echo in 2011 following exacerbation   Assessment: 69 yoM admitted 2/19 with UTI and ARF. Patient is well known to pharmacy from antibiotic dosing. Pt on on chronic warfarin PTA for atrial fibrillation with AVR mechanical valve, followed by [place] anti-coagulation clinic. Warfarin held been held recently for urologic  procedure, restarted 2/11. Home dose 2.5mg  daily except 5mg  every 3rd day. Last dose taken 2/18. INR on admission within therapeutic range at 2.38.  Warfarin initially resumed, however held since 2/22 d/t hematuria.  Today, pharmacy consulted to resume coumadin again.    [facility] has coumadin dose listed as 2.5mg  po daily except 5mg  M/W/F.   Inpatient warfarin doses: 2.5 mg (2/19), 2.5 mg (2/20), 1 mg (2/21), none 2/22 and 2/23  2/26: No further hematuria, CBC stable. INR small decrease to 1.8. Slightly subtherapeutic.  Appetite better.  Renal function improving. No major DDIs noted. Noted plans for D/C on doxycycline.      Goal of Therapy:  INR 2-3 Monitor platelets by anticoagulation protocol: Yes   Plan:  Repeat Coumadin 2.5mg  po x 1 tonight.  F/u daily PT/INR.    Ralene Bathe, PharmD, BCPS 03/06/2013, 1:51 PM  Pager: [phone]

## 2013-03-06 NOTE — Progress Notes (Signed)
Patient discharged home. Discharge instructions given and explained to patient/family and they verbalized understanding, patient denies any pain/distress. Discharged with foley, to F/U out patient with the urologist. No wound noted, healed scab on left heel.  Patient accompanied home by daughter and son inlaw. Transported to the car by staff.

## 2013-03-06 NOTE — Progress Notes (Signed)
Physical Therapy Treatment Patient Details Name: Eric Wheeler MRN: [id number] DOB: 01-06-1920 Today's Date: 03/06/2013 Time: 5176-1607 PT Time Calculation (min): 21 min  PT Assessment / Plan / Recommendation  History of Present Illness 80 year old male with a history of A. fib, hypertension, diastolic and systolic CHF, St. Jude's AVR on coumadin who recently underwent a large bladder tumor resection (multifocal papillary tumor) on 02/19/13 by Dr. [name] who now presented to [facility] ED after found to be more confused at home and initially noted three days prior to this admission. Daughter at bedside provided most of the details and explains she has also noted urinary retention since the foley was removed three days prior to this admission and and nearly no urine output since that time. This has also been associated with nausea and several episodes of non bloody vomiting, poor oral intake, malaise. Pt denies chest pain or shortness of breath.    PT Comments   Pt feeling better and eager to go home.  Assisted OOB to amb full unit.  Used RW due to open space and safety however most likely pt will use cane within his home.   Follow Up Recommendations  Home health PT     Does the patient have the potential to tolerate intense rehabilitation     Barriers to Discharge        Equipment Recommendations  None recommended by PT    Recommendations for Other Services    Frequency Min 3X/week   Progress towards PT Goals Progress towards PT goals: Progressing toward goals  Plan      Precautions / Restrictions Precautions Precautions: Fall Restrictions Weight Bearing Restrictions: Yes   Pertinent Vitals/Pain     Mobility  Bed Mobility Overal bed mobility: Modified Independent General bed mobility comments: increased time Transfers Overall transfer level: Needs assistance Equipment used: Rolling walker (2 wheeled) Transfers: Sit to/from Stand Sit to Stand: Min guard General transfer comment:  good safety tech Ambulation/Gait Ambulation/Gait assistance: Supervision;Min guard Ambulation Distance (Feet): 350 Feet Assistive device: Rolling walker (2 wheeled) Gait Pattern/deviations: Step-through pattern Gait velocity: WFL General Gait Details: More steady with RW although most likely will not need one within house amb.      PT Goals (current goals can now be found in the care plan section)    Visit Information  Last PT Received On: 03/06/13 Assistance Needed: +1 History of Present Illness: 80 year old male with a history of A. fib, hypertension, diastolic and systolic CHF, St. Jude's AVR on coumadin who recently underwent a large bladder tumor resection (multifocal papillary tumor) on 02/19/13 by Dr. [name] who now presented to [facility] ED after found to be more confused at home and initially noted three days prior to this admission. Daughter at bedside provided most of the details and explains she has also noted urinary retention since the foley was removed three days prior to this admission and and nearly no urine output since that time. This has also been associated with nausea and several episodes of non bloody vomiting, poor oral intake, malaise. Pt denies chest pain or shortness of breath.     Subjective Data      Cognition       Balance     End of Session PT - End of Session Equipment Utilized During Treatment: Gait belt Activity Tolerance: Patient tolerated treatment well Patient left: in bed;with call bell/phone within reach;with family/visitor present   Rica Koyanagi  PTA [facility]  Acute  Rehab Pager  [phone] 

## 2013-03-06 NOTE — Discharge Summary (Signed)
Physician Discharge Summary  Eric Wheeler [id number] DOB: [birth year] DOA: 02/27/2013  PCP: Eric Merle, NP  Admit date: 02/27/2013 Discharge date: 03/06/2013  Time spent: 38* minutes  Recommendations for Outpatient Follow-up:  *Follow up PCP in one week Follow up Urology as outpatient  Follow up cardiology on Monday to get PT/INR checked and check the BMP  Discharge Diagnoses:  Principal Problem:   Acute encephalopathy Active Problems:   Chronic atrial fibrillation   Chronic diastolic congestive heart failure- last EF 45-50%   S/P AVR (aortic valve replacement)   Bladder cancer- s/p surgery 02/20/13   UTI (urinary tract infection)   Anemia of chronic disease   Hydronephrosis   Uropathy, obstructive   Acute renal failure   Chronic anticoagulation   Hematuria- Coumadin on hold   Discharge Condition: Stable  Diet recommendation: low salt diet  Filed Weights   03/04/13 0600 03/05/13 0525 03/06/13 0505  Weight: 76 kg (167 lb 8.8 oz) 71.5 kg (157 lb 10.1 oz) 70.5 kg (155 lb 6.8 oz)    History of present illness:  80 year old male with a history of A. fib, hypertension, diastolic and systolic CHF, St. Jude's AVR on coumadin who recently underwent a large bladder tumor resection (multifocal papillary tumor) on 02/19/13 by Dr. Jeffie Wheeler who now presented to [facility] ED after found to be more confused at home and initially noted three days prior to this admission. Daughter at bedside provided most of the details and explains she has also noted urinary retention since the foley was removed three days prior to this admission and and nearly no urine output since that time. This has also been associated with nausea and several episodes of non bloody vomiting, poor oral intake, malaise. Pt denies chest pain or shortness of breath.  In ED, pt noted to have Cr > 6 and was determined to be most likely post obstructive in etiology. [facility] asked to admit to telemetry bed and urology team has been  consulted by ED doctor.    Hospital Course:   metabolic encephalopathy  - improved  - secondary to acute infection, UTI and urinary retention form obstructive uropathy   UTI (urinary tract infection)  - Urine culture reviewed, coagulase negative staph, sensitivities reviewed by Dr.[name] with Dr. [name])  - Patient was on Zosyn which was discontinued  - Per Dr.[name], Dr. Graylon Good who recommended IV vancomycin, and DC on nitrofurantoin for total of 10 days course for UTI complicated with acute renal failure and bilateral hydroureteronephrosis  Pharmacy recommending doxycycline over nitrofurantoin post discharge because of the AKI, so will discharge him Doxycycline 100 mg po BID for 10 days.  Acute renal failure/ Obstructive uropathy/ bilateral hydroureteronephrosis  - foley placed, Irrigated by urology, renal ultrasound ordered  Creatinine improved to 1.64 -hematuria cleared -stopped IVF today since volume overloaded  - Will need foley catheter for one more week - Follow up Urology as outpatient  Chronic atrial fibrillation  - Coumadin restarted  - Cardiology following  -rate up, suspect related to volume overload, Was given lasix yesterday with good urine output, will repeat one more dose of lasix.  -continue coreg, ?increase dose, defer to cards, no good candidate for diltiazem due to cardiomyopathy   S/p AVR /mechanical  -Coumadin restarted per cardiology  - levels are sub therapeutic, OK to discharge from cardiology standpoint.  - Will need PT/INR checked on Monday  Acute on Chronic systolic congestive heart failure  -Was volume overloaded on exam yesterday, will stoped IVF, diurese with IV  lasix x2  Dyspnea improved after he got lasix - 2 D ECHO with EF in 45%and diffuse hypokinesis  -continue coreg   Bladder cancer   Will need BCG therapy as per urology  Anemia of chronic disease  - secondary to history of bladder cancer   Hypokalemia Potassium replaced in the  hospital Will continue to take the potassium supplement at home Repeat BMP in three days  Procedures: Renal ultrasound- No hydronephrosis or renal mass identified    Consultations: Urology Cardiology  Discharge Exam: Filed Vitals:   03/06/13 0742  BP: 103/66  Pulse: 118  Temp:   Resp:     General: Appear in no acute distress Cardiovascular: s1s2 RRR Respiratory: Clear bilaterally  Discharge Instructions  Discharge Orders   Future Orders Complete By Expires   Diet - low sodium heart healthy  As directed    Increase activity slowly  As directed        Medication List         carvedilol 6.25 MG tablet  Commonly known as:  COREG  Take 6.25 mg by mouth 2 (two) times daily with a meal.     doxycycline 100 MG capsule  Commonly known as:  VIBRAMYCIN  Take 1 capsule (100 mg total) by mouth 2 (two) times daily.     finasteride 5 MG tablet  Commonly known as:  PROSCAR  Take 5 mg by mouth every morning.     furosemide 40 MG tablet  Commonly known as:  LASIX  Take 40 mg by mouth 2 (two) times daily as needed for fluid.     ondansetron 4 MG disintegrating tablet  Commonly known as:  ZOFRAN-ODT  Take 4 mg by mouth every 6 (six) hours as needed for nausea or vomiting.     phenazopyridine 100 MG tablet  Commonly known as:  PYRIDIUM  Take 100 mg by mouth 3 (three) times daily as needed for pain.     potassium chloride 20 MEQ packet  Commonly known as:  KLOR-CON  Take 20 mEq by mouth 2 (two) times daily.     simvastatin 10 MG tablet  Commonly known as:  ZOCOR  Take 10 mg by mouth at bedtime.     terazosin 5 MG capsule  Commonly known as:  HYTRIN  Take 5 mg by mouth at bedtime.     traMADol 50 MG tablet  Commonly known as:  ULTRAM  Take 50 mg by mouth every 6 (six) hours as needed for moderate pain.     warfarin 5 MG tablet  Commonly known as:  COUMADIN  Take 2.5-5 mg by mouth See admin instructions. Take 2.5mg  every night except take 5mg  every third night        Allergies  Allergen Reactions  . Ace Inhibitors Other (See Comments)    Dizziness   . Codeine Nausea And Vomiting  . Sulfa Drugs Cross Reactors Nausea And Vomiting       Follow-up Information   Follow up with [name], MD On 03/17/2013. (Call for appt to do voiding trial either with me or a nurse practitioner. )    Specialty:  Urology   Contact information:   [facility]  [place] [address] [phone]       Follow up with Eric Merle, NP In 1 week.   Specialty:  Nurse Practitioner   Contact information:   [address] [place] [address] [phone]  Follow up with [facility]. (Monday to check warfarin level, and BMP to check the potassium levels)    Specialty:  Cardiology   Contact information:   [address] [place] [address] [phone]       The results of significant diagnostics from this hospitalization (including imaging, microbiology, ancillary and laboratory) are listed below for reference.    Significant Diagnostic Studies: Ct Abdomen Pelvis Wo Contrast  02/27/2013   CLINICAL DATA:  Anuria, history of bladder cancer with removal of urinary catheter 4 days ago.  EXAM: CT ABDOMEN AND PELVIS WITHOUT CONTRAST  TECHNIQUE: Multidetector CT imaging of the abdomen and pelvis was performed following the standard protocol without intravenous contrast.  COMPARISON:  DG ABD ACUTE W/CHEST dated 02/27/2013; CT ABD-PEL WO/W CM dated 12/31/2012  FINDINGS: Trace pleural effusions are noted.  Unenhanced CT was performed per clinician order. Lack of IV contrast limits sensitivity and specificity, especially for evaluation of abdominal/pelvic solid viscera. 1 cm lateral segment left hepatic lobe cyst is reidentified. Too small to characterize 5 mm lateral segment left hepatic lobe hypodense lesion image 9 is stable. Gallstones are noted within the decompressed  otherwise normal-appearing gallbladder. Motion artifact degrades imaging at multiple levels. Adrenal glands, spleen, and pancreas are grossly unremarkable.  There has been interval development of mild hydroureteronephrosis to the level of the bladder which is partly decompressed but demonstrates partly visualized wall thickening of at least 1 cm image 67. A Foley catheter is in place. At the right bladder apex, there is a 0.7 cm dominant lymph node as well as other similar-appearing enlarged supravesical lymph nodes for example image 57. 0.8 cm periaortic node identified image 40. Pericaval 0.7 cm lymph node identified image 51, not significantly changed. The supravesical nodes are increase in size since previously but the dominant periaortic node is stable in size. There is trace fluid tracking along the ureters tracking to the pelvis. Marked enlargement of the prostate is again noted. Bilateral small inguinal nodes are stable.  No bowel wall thickening or focal segmental dilatation. Moderate atheromatous aortic calcification without aneurysm.  The bones are subjectively osteopenic. Multilevel disc degenerative changes noted. No new lytic or sclerotic osseous lesion is identified. Lower thoracic loss of vertebral body height is reidentified.  IMPRESSION: Diffusely thick walled bladder with mild proximal bilateral hydroureteronephrosis. Given the history of bladder cancer, this may indicate tumor involvement although cystitis or posttreatment change could appear similar.  Retroperitoneal and supravesical lymphadenopathy, with interval increase in size of the supravesical nodes which could be neoplastic or inflammatory.  New trace bilateral pleural effusions.   Electronically Signed   By: Conchita Paris M.D.   On: 02/27/2013 16:45   US Renal  03/03/2013   CLINICAL DATA:  Bladder cancer.  Hematuria.  EXAM: RENAL/URINARY TRACT ULTRASOUND COMPLETE  COMPARISON:  02/27/2013 CT.  FINDINGS: Right Kidney:  Length: 10.3  cm. Echogenicity within normal limits. No mass or hydronephrosis visualized.  Left Kidney:  Length: 10.3 cm. Echogenicity within normal limits. No mass or hydronephrosis visualized.  Bladder:  Foley catheter in place. Debris within the bladder. Bladder is partially decompressed. Limited for detection of bladder mass.  IMPRESSION: No hydronephrosis or renal mass identified.  Decompressed bladder (Foley catheter in place) contains debris. Limited for evaluating for bladder mass.   Electronically Signed   By: Chauncey Cruel M.D.   On: 03/03/2013 15:44   Dg Abd Acute W/chest  02/27/2013   CLINICAL DATA:  Abdominal distention. Unable  to void. Bladder cancer.  EXAM: ACUTE ABDOMEN SERIES (ABDOMEN 2 VIEW & CHEST 1 VIEW)  COMPARISON:  Two-view chest x-ray 03/29/2012. CT of the abdomen and pelvis 12/31/2012.  FINDINGS: Heart is enlarged. Chronic interstitial coarsening is evident. No focal airspace disease is evident.  There is mild distention of the transverse colon. The transition point appears to be just beyond the splenic flexure. No free air is evident. The SI joints are fused bilaterally with findings suggesting ankylosing spondylitis.  IMPRESSION: 1. Mild distention of transverse and proximal colon with a transition point just beyond the and splenic flexure. This raises the possibility of diverticulitis. 2. Stable cardiomegaly without failure. 3. Fusion of the SI joints bilaterally and findings suggesting ankylosing spondylitis.   Electronically Signed   By: Lawrence Santiago M.D.   On: 02/27/2013 14:24    Microbiology: Recent Results (from the past 240 hour(s))  URINE CULTURE     Status: None   Collection Time    02/27/13  2:37 PM      Result Value Ref Range Status   Specimen Description URINE, CATHETERIZED   Final   Special Requests NONE   Final   Culture  Setup Time     Final   Value: 02/27/2013 16:36     Performed at [facility]     Final   Value: 95,000 COLONIES/ML      Performed at [facility]   Culture     Final   Value: STAPHYLOCOCCUS SPECIES (COAGULASE NEGATIVE)     Note: RIFAMPIN AND GENTAMICIN SHOULD NOT BE USED AS SINGLE DRUGS FOR TREATMENT OF STAPH INFECTIONS.     Performed at [facility]   Report Status 03/02/2013 FINAL   Final   Organism ID, Bacteria STAPHYLOCOCCUS SPECIES (COAGULASE NEGATIVE)   Final  URINE CULTURE     Status: None   Collection Time    02/27/13 11:36 PM      Result Value Ref Range Status   Specimen Description URINE, CATHETERIZED   Final   Special Requests NONE   Final   Culture  Setup Time     Final   Value: 02/28/2013 05:57     Performed at [facility]     Final   Value: 40,000 COLONIES/ML     Performed at [facility]   Culture     Final   Value: STAPHYLOCOCCUS SPECIES (COAGULASE NEGATIVE)     Note: RIFAMPIN AND GENTAMICIN SHOULD NOT BE USED AS SINGLE DRUGS FOR TREATMENT OF STAPH INFECTIONS.     Performed at [facility]   Report Status 03/02/2013 FINAL   Final   Organism ID, Bacteria STAPHYLOCOCCUS SPECIES (COAGULASE NEGATIVE)   Final     Labs: Basic Metabolic Panel:  Recent Labs Lab 02/27/13 1329 02/27/13 2000  03/02/13 9628 03/03/13 0503 03/04/13 0413 03/05/13 0438 03/06/13 0350  NA 136* 137  < > 149* 145 142 144 140  K 5.2 4.7  < > 4.3 4.5 4.0 3.4* 2.9*  CL 96 97  < > 110 109 107 103 98  CO2 19 20  < > 25 21 22 27 28   GLUCOSE 140* 115*  < > 149* 147* 113* 128* 163*  BUN 84* 79*  < > 61* 70* 57* 42* 35*  CREATININE 6.39* 5.43*  < > 2.95* 3.85* 2.33* 1.84* 1.64*  CALCIUM 7.8* 7.9*  < > 8.1* 8.6 8.2* 8.3* 8.2*  MG  --  2.0  --   --   --   --   --   --   PHOS  --  6.4*  --   --   --   --   --   --   < > = values in this interval not displayed. Liver Function Tests:  Recent Labs Lab 02/27/13 1329 02/27/13 2000 02/28/13 0405  AST 13 11 9   ALT 13 12 11   ALKPHOS 85 79 63  BILITOT 0.5 0.4 0.4  PROT 6.7 6.5 5.9*  ALBUMIN 2.8* 2.7* 2.5*    No results found for this basename: LIPASE, AMYLASE,  in the last 168 hours No results found for this basename: AMMONIA,  in the last 168 hours CBC:  Recent Labs Lab 02/27/13 1329 02/27/13 2000 02/28/13 0405 03/02/13 0412 03/03/13 1451 03/04/13 0413 03/05/13 0438  WBC 17.4* 14.8* 9.2 15.0* 14.7* 11.4* 12.9*  NEUTROABS 15.4* 12.9*  --   --   --   --   --   HGB 10.7* 9.7* 10.0* 10.3* 9.5* 9.7* 10.5*  HCT 33.3* 29.7* 30.3* 33.1* 30.3* 31.4* 33.8*  MCV 91.0 90.0 89.9 93.0 92.9 92.6 92.3  PLT 165 183 168 187 174 174 215   Cardiac Enzymes: No results found for this basename: CKTOTAL, CKMB, CKMBINDEX, TROPONINI,  in the last 168 hours BNP: BNP (last 3 results)  Recent Labs  03/29/12 1615  PROBNP 2069.0*   CBG:  Recent Labs Lab 03/02/13 0715 03/03/13 0757 03/04/13 0752 03/05/13 0736 03/06/13 0733  GLUCAP 125* 140* 117* 122* 110*       Signed:  Arlenne Kimbley S  [facility] 03/06/2013, 12:54 PM

## 2013-03-06 NOTE — Progress Notes (Signed)
Patient ID: Eric Wheeler, male   DOB: [birth year], 80 y.o.   MRN: [id number]    Subjective: He is doing well and the urine remains clear.   He is eating well and has been cleared by cardiology.   ROS: Negative except as above  Objective: Vital signs in last 24 hours: Temp:  [97.5 F (36.4 C)-97.8 F (36.6 C)] 97.8 F (36.6 C) (02/26 0505) Pulse Rate:  [86-122] 118 (02/26 0742) Resp:  [20] 20 (02/26 0505) BP: (103-126)/(60-74) 103/66 mmHg (02/26 0742) SpO2:  [98 %-99 %] 99 % (02/26 0505) Weight:  [70.5 kg (155 lb 6.8 oz)] 70.5 kg (155 lb 6.8 oz) (02/26 0505)  Intake/Output from previous day: 02/25 0701 - 02/26 0700 In: 1140 [P.O.:1140] Out: 3850 [Urine:3850] Intake/Output this shift: Total I/O In: -  Out: 300 [Urine:300]  General appearance: alert and no distress  Lab Results:   Recent Labs  03/04/13 0413 03/05/13 0438  WBC 11.4* 12.9*  HGB 9.7* 10.5*  HCT 31.4* 33.8*  PLT 174 215   BMET  Recent Labs  03/05/13 0438 03/06/13 0350  NA 144 140  K 3.4* 2.9*  CL 103 98  CO2 27 28  GLUCOSE 128* 163*  BUN 42* 35*  CREATININE 1.84* 1.64*  CALCIUM 8.3* 8.2*   PT/INR  Recent Labs  03/05/13 0438 03/06/13 0350  LABPROT 21.5* 20.8*  INR 1.93* 1.85*   ABG No results found for this basename: PHART, PCO2, PO2, HCO3,  in the last 72 hours  Studies/Results: No results found.  Anti-infectives: Anti-infectives   Start     Dose/Rate Route Frequency Ordered Stop   03/04/13 1400  vancomycin (VANCOCIN) 500 mg in sodium chloride 0.9 % 100 mL IVPB     500 mg 100 mL/hr over 60 Minutes Intravenous Every 24 hours 03/04/13 1235     02/28/13 0000  piperacillin-tazobactam (ZOSYN) IVPB 2.25 g  Status:  Discontinued     2.25 g 100 mL/hr over 30 Minutes Intravenous Every 8 hours 02/27/13 2135 03/02/13 1614   02/27/13 1445  piperacillin-tazobactam (ZOSYN) IVPB 3.375 g     3.375 g 100 mL/hr over 30 Minutes Intravenous  Once 02/27/13 1441 03/04/13 1900   02/27/13 1430   cefTRIAXone (ROCEPHIN) 1 g in dextrose 5 % 50 mL IVPB  Status:  Discontinued     1 g 100 mL/hr over 30 Minutes Intravenous  Once 02/27/13 1427 02/27/13 1441      Current Facility-Administered Medications  Medication Dose Route Frequency Provider Last Rate Last Dose  . acetaminophen (TYLENOL) tablet 650 mg  650 mg Oral Q6H PRN Theodis Blaze, MD   650 mg at 03/01/13 1620   Or  . acetaminophen (TYLENOL) suppository 650 mg  650 mg Rectal Q6H PRN Theodis Blaze, MD      . carvedilol (COREG) tablet 6.25 mg  6.25 mg Oral BID WC Theodis Blaze, MD   6.25 mg at 03/06/13 0741  . dextrose (GLUTOSE) 40 % oral gel 37.5 g  1 Tube Oral PRN Theodis Blaze, MD      . finasteride (PROSCAR) tablet 5 mg  5 mg Oral q morning - 10a Theodis Blaze, MD   5 mg at 03/06/13 1044  . glucose-Vitamin C 4-0.006 GM per chewable tablet 4 tablet  4 tablet Oral PRN Theodis Blaze, MD      . morphine 2 MG/ML injection 1 mg  1 mg Intravenous Q4H PRN Theodis Blaze, MD      .  ondansetron (ZOFRAN) tablet 4 mg  4 mg Oral Q6H PRN Theodis Blaze, MD       Or  . ondansetron [facility]) injection 4 mg  4 mg Intravenous Q6H PRN Theodis Blaze, MD      . phenazopyridine (PYRIDIUM) tablet 100 mg  100 mg Oral TID PRN Theodis Blaze, MD      . simvastatin (ZOCOR) tablet 10 mg  10 mg Oral QHS Theodis Blaze, MD   10 mg at 03/05/13 2126  . sodium chloride 0.9 % injection 3 mL  3 mL Intravenous Q12H Theodis Blaze, MD   3 mL at 03/03/13 2217  . terazosin (HYTRIN) capsule 5 mg  5 mg Oral QHS Theodis Blaze, MD   5 mg at 03/05/13 2126  . traMADol (ULTRAM) tablet 50 mg  50 mg Oral Q6H PRN Theodis Blaze, MD   50 mg at 03/02/13 1232  . vancomycin (VANCOCIN) 500 mg in sodium chloride 0.9 % 100 mL IVPB  500 mg Intravenous Q24H Kara Mead, RPH   500 mg at 03/05/13 1403  . Warfarin - Pharmacist Dosing Inpatient   Does not apply q1800 Rudean Haskell, RPH        Assessment: He has no further hematuria or pain. His Cr is down to 1.64 but his K is a bit  low at 2.9.     Plan: He is ok for discharge from a urology standpoint. He will need the foley until a week from Monday. 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     LOS: 7 days    [name] 03/06/2013

## 2013-03-06 NOTE — Progress Notes (Signed)
CRITICAL VALUE ALERT  Critical value received:  Potassium 2.9  Date of notification:  03/06/13  Time of notification:  0454  Critical value read back:yes  Nurse who received alert:  Reynold Bowen, RN  MD notified (1st page):  Baltazar Najjar  Time of first page:  0555  MD notified (2nd page):  Time of second page:  Responding MD:  Baltazar Najjar  Time MD responded:  [id number]  New orders put in computer for PO potassium

## 2013-03-06 NOTE — Progress Notes (Signed)
Subjective:  No new complaints.  No hematuria SOB improved.   K low this am.   Objective:  Vital Signs in the last 24 hours: Temp:  [97.5 F (36.4 C)-97.8 F (36.6 C)] 97.8 F (36.6 C) (02/26 0505) Pulse Rate:  [86-122] 118 (02/26 0742) Resp:  [20] 20 (02/26 0505) BP: (103-126)/(60-74) 103/66 mmHg (02/26 0742) SpO2:  [98 %-99 %] 99 % (02/26 0505) Weight:  [155 lb 6.8 oz (70.5 kg)] 155 lb 6.8 oz (70.5 kg) (02/26 0505)  Intake/Output from previous day: 02/25 0701 - 02/26 0700 In: 1140 [P.O.:1140] Out: 3850 [Urine:3850]   Physical Exam: General: Elderly, in no acute distress. Head:  Normocephalic and atraumatic. Lungs: Clear to auscultation and percussion. Heart: Mildly tachycardic, irreg irreg  No murmur, rubs or gallops.  Abdomen: soft, non-tender, positive bowel sounds. Extremities: No clubbing or cyanosis. No edema. Foley, no hematuria.   Neurologic: Alert and oriented x 3.    Lab Results:  Recent Labs  03/04/13 0413 03/05/13 0438  WBC 11.4* 12.9*  HGB 9.7* 10.5*  PLT 174 215    Recent Labs  03/05/13 0438 03/06/13 0350  NA 144 140  K 3.4* 2.9*  CL 103 98  CO2 27 28  GLUCOSE 128* 163*  BUN 42* 35*  CREATININE 1.84* 1.64*   Imaging: No results found. Telemetry: AFIB 90-110 Personally viewed.   Prior to Admission medications   Medication Sig Start Date End Date Taking? Authorizing Provider  carvedilol (COREG) 6.25 MG tablet Take 6.25 mg by mouth 2 (two) times daily with a meal.   Yes Historical Provider, MD  finasteride (PROSCAR) 5 MG tablet Take 5 mg by mouth every morning.    Yes Historical Provider, MD  furosemide (LASIX) 40 MG tablet Take 40 mg by mouth 2 (two) times daily as needed for fluid.   Yes Historical Provider, MD  ondansetron (ZOFRAN-ODT) 4 MG disintegrating tablet Take 4 mg by mouth every 6 (six) hours as needed for nausea or vomiting.   Yes Historical Provider, MD  phenazopyridine (PYRIDIUM) 100 MG tablet Take 100 mg by mouth  3 (three) times daily as needed for pain.   Yes Historical Provider, MD  potassium chloride (KLOR-CON) 20 MEQ packet Take 20 mEq by mouth 2 (two) times daily.   Yes Historical Provider, MD  simvastatin (ZOCOR) 10 MG tablet Take 10 mg by mouth at bedtime.   Yes Historical Provider, MD  terazosin (HYTRIN) 5 MG capsule Take 5 mg by mouth at bedtime.     Yes Historical Provider, MD  traMADol (ULTRAM) 50 MG tablet Take 50 mg by mouth every 6 (six) hours as needed for moderate pain.   Yes Historical Provider, MD  warfarin (COUMADIN) 5 MG tablet Take 2.5-5 mg by mouth See admin instructions. Take 2.5mg  every night except take 5mg  every third night   Yes Historical Provider, MD    Assessment/Plan:  Principal Problem:   Acute encephalopathy Active Problems:   Chronic atrial fibrillation   Chronic diastolic congestive heart failure- last EF 45-50%   S/P AVR (aortic valve replacement)   Bladder cancer- s/p surgery 02/20/13   UTI (urinary tract infection)   Anemia of chronic disease   Hydronephrosis   Uropathy, obstructive   Acute renal failure   Chronic anticoagulation   Hematuria- Coumadin on hold  1) AFIB  - reasonable rate control. Mildly elevated in the setting of recent surgery but reasonable. No change made in coreg 6.25mg  BID.   2) AVR mechanical  -  Restarted coumadin. Recommend INR check on Monday.   3) Dyspnea  - Improved. Takes lasix 40mg  po BID as needed for increased weight, SOB. OK with him continuing this.   4) Hypokalemia  - after getting potassium PO (next dose soon), I am fine with him being discharged home today.   Told him about getting coumadin check on Monday and also would like him to see [name], APP next week as well.    Navneet Schmuck, [place] 03/06/2013, 9:08 AM

## 2013-03-07 ENCOUNTER — Other Ambulatory Visit: Payer: Self-pay | Admitting: *Deleted

## 2013-03-07 DIAGNOSIS — N189 Chronic kidney disease, unspecified: Secondary | ICD-10-CM

## 2013-03-10 ENCOUNTER — Other Ambulatory Visit: Payer: Medicare Other

## 2013-03-10 ENCOUNTER — Ambulatory Visit (INDEPENDENT_AMBULATORY_CARE_PROVIDER_SITE_OTHER): Payer: Medicare Other | Admitting: Pharmacist

## 2013-03-10 ENCOUNTER — Encounter (HOSPITAL_COMMUNITY): Payer: Self-pay | Admitting: Emergency Medicine

## 2013-03-10 ENCOUNTER — Other Ambulatory Visit (INDEPENDENT_AMBULATORY_CARE_PROVIDER_SITE_OTHER): Payer: Medicare Other

## 2013-03-10 ENCOUNTER — Inpatient Hospital Stay (HOSPITAL_COMMUNITY)
Admission: EM | Admit: 2013-03-10 | Discharge: 2013-03-15 | DRG: 682 | Disposition: A | Discharge status: Home Health | Payer: Medicare Other | Attending: Internal Medicine | Admitting: Internal Medicine

## 2013-03-10 DIAGNOSIS — M109 Gout, unspecified: Secondary | ICD-10-CM | POA: Diagnosis present

## 2013-03-10 DIAGNOSIS — I5042 Chronic combined systolic (congestive) and diastolic (congestive) heart failure: Secondary | ICD-10-CM | POA: Diagnosis present

## 2013-03-10 DIAGNOSIS — Z952 Presence of prosthetic heart valve: Secondary | ICD-10-CM

## 2013-03-10 DIAGNOSIS — E869 Volume depletion, unspecified: Secondary | ICD-10-CM | POA: Diagnosis present

## 2013-03-10 DIAGNOSIS — N189 Chronic kidney disease, unspecified: Secondary | ICD-10-CM | POA: Diagnosis present

## 2013-03-10 DIAGNOSIS — R339 Retention of urine, unspecified: Secondary | ICD-10-CM | POA: Diagnosis not present

## 2013-03-10 DIAGNOSIS — Z7901 Long term (current) use of anticoagulants: Secondary | ICD-10-CM

## 2013-03-10 DIAGNOSIS — I5032 Chronic diastolic (congestive) heart failure: Secondary | ICD-10-CM | POA: Diagnosis not present

## 2013-03-10 DIAGNOSIS — N390 Urinary tract infection, site not specified: Secondary | ICD-10-CM

## 2013-03-10 DIAGNOSIS — Z87891 Personal history of nicotine dependence: Secondary | ICD-10-CM

## 2013-03-10 DIAGNOSIS — C679 Malignant neoplasm of bladder, unspecified: Secondary | ICD-10-CM | POA: Diagnosis not present

## 2013-03-10 DIAGNOSIS — D630 Anemia in neoplastic disease: Secondary | ICD-10-CM | POA: Diagnosis present

## 2013-03-10 DIAGNOSIS — Z66 Do not resuscitate: Secondary | ICD-10-CM | POA: Diagnosis present

## 2013-03-10 DIAGNOSIS — I4891 Unspecified atrial fibrillation: Secondary | ICD-10-CM | POA: Diagnosis not present

## 2013-03-10 DIAGNOSIS — D638 Anemia in other chronic diseases classified elsewhere: Secondary | ICD-10-CM

## 2013-03-10 DIAGNOSIS — IMO0002 Reserved for concepts with insufficient information to code with codable children: Secondary | ICD-10-CM | POA: Diagnosis not present

## 2013-03-10 DIAGNOSIS — N179 Acute kidney failure, unspecified: Secondary | ICD-10-CM | POA: Diagnosis not present

## 2013-03-10 DIAGNOSIS — Z954 Presence of other heart-valve replacement: Secondary | ICD-10-CM

## 2013-03-10 DIAGNOSIS — Z8249 Family history of ischemic heart disease and other diseases of the circulatory system: Secondary | ICD-10-CM | POA: Diagnosis not present

## 2013-03-10 DIAGNOSIS — I482 Chronic atrial fibrillation, unspecified: Secondary | ICD-10-CM

## 2013-03-10 DIAGNOSIS — I129 Hypertensive chronic kidney disease with stage 1 through stage 4 chronic kidney disease, or unspecified chronic kidney disease: Secondary | ICD-10-CM | POA: Diagnosis present

## 2013-03-10 DIAGNOSIS — R944 Abnormal results of kidney function studies: Secondary | ICD-10-CM | POA: Diagnosis not present

## 2013-03-10 DIAGNOSIS — Z825 Family history of asthma and other chronic lower respiratory diseases: Secondary | ICD-10-CM | POA: Diagnosis not present

## 2013-03-10 DIAGNOSIS — R627 Adult failure to thrive: Secondary | ICD-10-CM | POA: Diagnosis not present

## 2013-03-10 DIAGNOSIS — R319 Hematuria, unspecified: Secondary | ICD-10-CM | POA: Diagnosis not present

## 2013-03-10 DIAGNOSIS — Z79899 Other long term (current) drug therapy: Secondary | ICD-10-CM

## 2013-03-10 DIAGNOSIS — Z87442 Personal history of urinary calculi: Secondary | ICD-10-CM | POA: Diagnosis not present

## 2013-03-10 DIAGNOSIS — I10 Essential (primary) hypertension: Secondary | ICD-10-CM | POA: Diagnosis not present

## 2013-03-10 DIAGNOSIS — I509 Heart failure, unspecified: Secondary | ICD-10-CM | POA: Diagnosis present

## 2013-03-10 DIAGNOSIS — R569 Unspecified convulsions: Secondary | ICD-10-CM | POA: Diagnosis present

## 2013-03-10 DIAGNOSIS — N400 Enlarged prostate without lower urinary tract symptoms: Secondary | ICD-10-CM | POA: Diagnosis present

## 2013-03-10 DIAGNOSIS — E43 Unspecified severe protein-calorie malnutrition: Secondary | ICD-10-CM | POA: Diagnosis present

## 2013-03-10 DIAGNOSIS — Z85828 Personal history of other malignant neoplasm of skin: Secondary | ICD-10-CM

## 2013-03-10 DIAGNOSIS — Z823 Family history of stroke: Secondary | ICD-10-CM

## 2013-03-10 DIAGNOSIS — R109 Unspecified abdominal pain: Secondary | ICD-10-CM | POA: Diagnosis not present

## 2013-03-10 DIAGNOSIS — T502X5A Adverse effect of carbonic-anhydrase inhibitors, benzothiadiazides and other diuretics, initial encounter: Secondary | ICD-10-CM | POA: Diagnosis present

## 2013-03-10 HISTORY — DX: Chronic kidney disease, unspecified: N18.9

## 2013-03-10 LAB — URINALYSIS, ROUTINE W REFLEX MICROSCOPIC
Bilirubin Urine: NEGATIVE
Glucose, UA: NEGATIVE mg/dL
Ketones, ur: NEGATIVE mg/dL
Nitrite: NEGATIVE
Protein, ur: NEGATIVE mg/dL
Specific Gravity, Urine: 1.011 (ref 1.005–1.030)
Urobilinogen, UA: 0.2 mg/dL (ref 0.0–1.0)
pH: 5.5 (ref 5.0–8.0)

## 2013-03-10 LAB — BASIC METABOLIC PANEL
BUN: 70 mg/dL — ABNORMAL HIGH (ref 6–23)
CO2: 28 mEq/L (ref 19–32)
Calcium: 8.3 mg/dL — ABNORMAL LOW (ref 8.4–10.5)
Chloride: 102 mEq/L (ref 96–112)
Creatinine, Ser: 4.4 mg/dL — ABNORMAL HIGH (ref 0.4–1.5)
GFR: 13.35 mL/min — CL (ref >60.00)
Glucose, Bld: 113 mg/dL — ABNORMAL HIGH (ref 70–99)
Potassium: 3.8 mEq/L (ref 3.5–5.1)
Sodium: 141 mEq/L (ref 135–145)

## 2013-03-10 LAB — URINE MICROSCOPIC-ADD ON

## 2013-03-10 LAB — CBC
HCT: 34 % — ABNORMAL LOW (ref 39.0–52.0)
Hemoglobin: 10.8 g/dL — ABNORMAL LOW (ref 13.0–17.0)
MCHC: 31.8 g/dL (ref 30.0–36.0)
MCV: 91.3 fl (ref 78.0–100.0)
Platelets: 269 10*3/uL (ref 150.0–400.0)
RBC: 3.72 Mil/uL — ABNORMAL LOW (ref 4.22–5.81)
RDW: 15.9 % — ABNORMAL HIGH (ref 11.5–14.6)
WBC: 13.9 10*3/uL — ABNORMAL HIGH (ref 4.5–10.5)

## 2013-03-10 LAB — POCT INR: INR: 4.1

## 2013-03-10 MED ORDER — POTASSIUM CHLORIDE 20 MEQ PO PACK
20.0000 meq | PACK | Freq: Two times a day (BID) | ORAL | Status: DC
  Filled 2013-03-10: qty 1

## 2013-03-10 MED ORDER — TERAZOSIN HCL 5 MG PO CAPS
5.0000 mg | ORAL_CAPSULE | Freq: Every day | ORAL | Status: DC
  Administered 2013-03-11 – 2013-03-14 (×5): 5 mg via ORAL
  Filled 2013-03-10 (×6): qty 1

## 2013-03-10 MED ORDER — ONDANSETRON HCL 4 MG/2ML IJ SOLN: 4.0000 mg | Freq: Four times a day (QID) | INTRAMUSCULAR | Status: DC | PRN

## 2013-03-10 MED ORDER — SIMVASTATIN 10 MG PO TABS
10.0000 mg | ORAL_TABLET | Freq: Every day | ORAL | Status: DC
  Administered 2013-03-11 – 2013-03-14 (×5): 10 mg via ORAL
  Filled 2013-03-10 (×6): qty 1

## 2013-03-10 MED ORDER — TRAMADOL HCL 50 MG PO TABS: 50.0000 mg | ORAL_TABLET | Freq: Four times a day (QID) | ORAL | Status: DC | PRN

## 2013-03-10 MED ORDER — SODIUM CHLORIDE 0.9 % IV BOLUS (SEPSIS)
500.0000 mL | Freq: Once | INTRAVENOUS | Status: AC
  Administered 2013-03-10: 500 mL via INTRAVENOUS

## 2013-03-10 MED ORDER — ALUM & MAG HYDROXIDE-SIMETH 200-200-20 MG/5ML PO SUSP: 30.0000 mL | Freq: Four times a day (QID) | ORAL | Status: DC | PRN

## 2013-03-10 MED ORDER — DOXYCYCLINE HYCLATE 100 MG PO TABS
100.0000 mg | ORAL_TABLET | Freq: Two times a day (BID) | ORAL | Status: DC
  Administered 2013-03-11 – 2013-03-15 (×10): 100 mg via ORAL
  Filled 2013-03-10 (×11): qty 1

## 2013-03-10 MED ORDER — SODIUM CHLORIDE 0.9 % IV SOLN
INTRAVENOUS | Status: DC
  Administered 2013-03-11 (×3): via INTRAVENOUS

## 2013-03-10 MED ORDER — SODIUM CHLORIDE 0.9 % IV SOLN
INTRAVENOUS | Status: DC
  Administered 2013-03-10: 21:00:00 via INTRAVENOUS

## 2013-03-10 MED ORDER — FINASTERIDE 5 MG PO TABS
5.0000 mg | ORAL_TABLET | Freq: Every morning | ORAL | Status: DC
  Administered 2013-03-11 – 2013-03-15 (×5): 5 mg via ORAL
  Filled 2013-03-10 (×5): qty 1

## 2013-03-10 MED ORDER — PANTOPRAZOLE SODIUM 40 MG PO TBEC
40.0000 mg | DELAYED_RELEASE_TABLET | Freq: Every day | ORAL | Status: DC
  Administered 2013-03-11 – 2013-03-15 (×6): 40 mg via ORAL
  Filled 2013-03-10 (×6): qty 1

## 2013-03-10 MED ORDER — ONDANSETRON 8 MG PO TBDP: 4.0000 mg | ORAL_TABLET | Freq: Four times a day (QID) | ORAL | Status: DC | PRN

## 2013-03-10 MED ORDER — ENSURE PUDDING PO PUDG
1.0000 | Freq: Three times a day (TID) | ORAL | Status: DC
  Administered 2013-03-11 – 2013-03-15 (×8): 1 via ORAL
  Filled 2013-03-10 (×15): qty 1

## 2013-03-10 MED ORDER — SODIUM CHLORIDE 0.9 % IV SOLN: INTRAVENOUS | Status: DC

## 2013-03-10 MED ORDER — SODIUM CHLORIDE 0.9 % IV BOLUS (SEPSIS)
500.0000 mL | Freq: Once | INTRAVENOUS | Status: AC
  Administered 2013-03-11: 500 mL via INTRAVENOUS

## 2013-03-10 MED ORDER — POTASSIUM CHLORIDE CRYS ER 20 MEQ PO TBCR
20.0000 meq | EXTENDED_RELEASE_TABLET | Freq: Two times a day (BID) | ORAL | Status: DC
  Administered 2013-03-11 – 2013-03-12 (×5): 20 meq via ORAL
  Filled 2013-03-10 (×7): qty 1

## 2013-03-10 MED ORDER — ONDANSETRON HCL 4 MG PO TABS: 4.0000 mg | ORAL_TABLET | Freq: Four times a day (QID) | ORAL | Status: DC | PRN

## 2013-03-10 MED ORDER — CARVEDILOL 6.25 MG PO TABS
6.2500 mg | ORAL_TABLET | Freq: Two times a day (BID) | ORAL | Status: DC
  Administered 2013-03-11 – 2013-03-15 (×9): 6.25 mg via ORAL
  Filled 2013-03-10 (×11): qty 1

## 2013-03-10 NOTE — H&P (Signed)
PCP:   [name], NP   Chief Complaint:  Abnormal lab  HPI: 80 yo male dx with bladder cancer s/p resection last month sent in by pcp for abnormal labs.  Since his surgery last month, he has been living at home with wife with indwelling foley catheter which has been draining clear urine.  He suppose to see urology next week to set up getting further treatement for his bladder cancer.  He has not been eating or drinking well, he says he is nauseated a lot.  His stomach is upset a lot, which improves when he does eat, but he doesn't want to eat.  No abd pain.  Just nausea, no vomting.  No fevers at home.  No swelling.  His cr is up to 4.4 today from 1.6.  Is on lasix at home and continues to take this.  Denies cp, or sob.    Review of Systems:  Positive and negative as per HPI otherwise all other systems are negative  Past Medical History: Past Medical History  Diagnosis Date  . Chronic atrial fibrillation   . Chronic anticoagulation     a. afib/st. jude avr - goal 2.5-3.5   . BPH (benign prostatic hypertrophy)   . Hypertension   . History of kidney stones   . Gout   . Syncope   . History of skin cancer   . Bladder tumor     a. s/p resection 02/19/2013.  [name] Hx of scarlet fever   . Seizures 2014    evaluated by Dr. Jannifer Franklin at [facility] neurology -  no cause determined - refered back to cardiologist - recommended loop recorder to monitor rythm but pt refused - continues to have seizures per family - notes in EPIC  . Chronic combined systolic and diastolic CHF (congestive heart failure)     a. EF 45 to 50% per echo in 2011 following exacerbation   Past Surgical History  Procedure Laterality Date  . Aortic valve replacement  1987    #21 MM ST JUDE PLACED BY DR.CHARLES WILSON  . Cataract extraction    . Transurethral resection of prostate N/A 02/19/2013    Procedure: TRANSURETHRAL RESECTION OF THE PROSTATE /bladder WITH GYRUS INSTRUMENTS;  Surgeon: Irine Seal, MD;  Location: [facility];   Service: Urology;  Laterality: N/A;  . Cystoscopy N/A 02/19/2013    Procedure: CYSTOSCOPY;  Surgeon: Irine Seal, MD;  Location: [facility];  Service: Urology;  Laterality: N/A;    Medications: Prior to Admission medications   Medication Sig Start Date End Date Taking? Authorizing Provider  carvedilol (COREG) 6.25 MG tablet Take 6.25 mg by mouth 2 (two) times daily with a meal.   Yes Historical Provider, MD  doxycycline (VIBRAMYCIN) 100 MG capsule Take 1 capsule (100 mg total) by mouth 2 (two) times daily. 03/06/13  Yes Oswald Hillock, MD  finasteride (PROSCAR) 5 MG tablet Take 5 mg by mouth every morning.    Yes Historical Provider, MD  furosemide (LASIX) 40 MG tablet Take 1 tablet once daily as needed, may take an additional tablet in the afternoon if weight is over 2lbs.   Yes Historical Provider, MD  ondansetron (ZOFRAN-ODT) 4 MG disintegrating tablet Take 4 mg by mouth every 6 (six) hours as needed for nausea or vomiting.   Yes Historical Provider, MD  phenazopyridine (PYRIDIUM) 100 MG tablet Take 100 mg by mouth 3 (three) times daily as needed for pain.   Yes Historical Provider, MD  potassium chloride (KLOR-CON) 20  MEQ packet Take 20 mEq by mouth 2 (two) times daily.   Yes Historical Provider, MD  simvastatin (ZOCOR) 10 MG tablet Take 10 mg by mouth at bedtime.   Yes Historical Provider, MD  terazosin (HYTRIN) 5 MG capsule Take 5 mg by mouth at bedtime.     Yes Historical Provider, MD  traMADol (ULTRAM) 50 MG tablet Take 50 mg by mouth every 6 (six) hours as needed for moderate pain.   Yes Historical Provider, MD  warfarin (COUMADIN) 5 MG tablet Beginning Tues, 03/11/13, take 1/2 tab (2.5mg ) once daily except every 4th day take 1 tab (5mg ) once daily.   Yes Historical Provider, MD    Allergies:   Allergies  Allergen Reactions  . Ace Inhibitors Other (See Comments)    Dizziness   . Codeine Nausea And Vomiting  . Sulfa Drugs Cross Reactors Nausea And Vomiting    Social History:  reports  that he quit smoking about 42 years ago. He has never used smokeless tobacco. He reports that he does not drink alcohol or use illicit drugs.  Family History: Family History  Problem Relation Age of Onset  . Cancer Mother   . Heart disease Father   . Asthma Father   . Stroke Father   . Dementia Sister   . Heart disease Brother     Physical Exam: Filed Vitals:   03/10/13 1825 03/10/13 1852  BP: 127/70 138/85  Pulse: 92 65  Temp: 97.6 F (36.4 C) 97.8 F (36.6 C)  TempSrc: Oral Oral  Resp: 16 16  SpO2: 97% 99%   General appearance: alert, cooperative and no distress Head: Normocephalic, without obvious abnormality, atraumatic Eyes: negative Nose: Nares normal. Septum midline. Mucosa normal. No drainage or sinus tenderness. Neck: no JVD and supple, symmetrical, trachea midline Lungs: clear to auscultation bilaterally Heart: regular rate and rhythm, S1, S2 normal, no murmur, click, rub or gallop Abdomen: soft, non-tender; bowel sounds normal; no masses,  no organomegaly foley draining clear amber urine Extremities: extremities normal, atraumatic, no cyanosis or edema Pulses: 2+ and symmetric Skin: Skin color, texture, turgor normal. No rashes or lesions Neurologic: Grossly normal    Labs on Admission:   Recent Labs  03/10/13 1049  NA 141  K 3.8  CL 102  CO2 28  GLUCOSE 113*  BUN 70*  CREATININE 4.4*  CALCIUM 8.3*    Recent Labs  03/10/13 1048  WBC 13.9*  HGB 10.8*  HCT 34.0*  MCV 91.3  PLT 269.0    Radiological Exams on Admission: none  Assessment/Plan 80 yo male with worsening renal failure from decreased po intake, nausea, in setting of diuretic use with h/o bladder cancer  Principal Problem:   Acute renal failure  Likely due to FTT with diuretic use.  Will hold lasix.  ivf overnight.  ua is pending.    Active Problems:   Chronic atrial fibrillation  stable   Chronic diastolic congestive heart failure- last EF 45-50%  Compensated, hold lasix    Bladder cancer- s/p surgery 02/20/13  Stable cont to f/u with urology next week   Anemia of chronic disease  stable   Chronic anticoagulation  Hold coumadin, inr over 4, repeat in am   FTT (failure to thrive) in adult  Will place him on protonix daily,  Ensure supplements.  Pt wishes DNR, no cpr or intubation in future, discussion held in front of wife and daughter.  Abbagayle Zaragoza A 03/10/2013, 8:30 PM

## 2013-03-10 NOTE — ED Provider Notes (Signed)
CSN: [id number]     Arrival date & time 03/10/13  1812 History   First MD Initiated Contact with Patient 03/10/13 1900     Chief Complaint  Patient presents with  . obstructed foley      (Consider location/radiation/quality/duration/timing/severity/associated sxs/prior Treatment) HPI  80 y.o. male with a past medical history significant for bladder cancer and had surgery (TUR) 02/20/13. He was discharged 2/13 15 and his Coumadin was resumed as an OP. He takes it for history hx of afib and AVR. He was readmitted 02/27/13 with confusion, UTI, and acute renal failure (SCr 6.39) secondary to obstruction. He has a baseline SCr around 1.5. Discharged 4 days ago (2/26) with Cr of 1.6. Had follow-up labs today and then referred to ED because Cr now 4.4. Pt reports catheter has seemed to be functioning ok. Occasional blood and clot but has been consistently emptying it. No appetite. Has to be really encouraged to eat or drink. On lasix. Reports compliance with meds. Weight on discharge 4 days ago was 155 lbs.    Past Medical History  Diagnosis Date  . Chronic atrial fibrillation   . Chronic anticoagulation     a. afib/st. jude avr - goal 2.5-3.5   . BPH (benign prostatic hypertrophy)   . Hypertension   . History of kidney stones   . Gout   . Syncope   . History of skin cancer   . Bladder tumor     a. s/p resection 02/19/2013.  [name] Hx of scarlet fever   . Seizures 2014    evaluated by Dr. Jannifer Franklin at [facility] neurology -  no cause determined - refered back to cardiologist - recommended loop recorder to monitor rythm but pt refused - continues to have seizures per family - notes in EPIC  . Chronic combined systolic and diastolic CHF (congestive heart failure)     a. EF 45 to 50% per echo in 2011 following exacerbation   Past Surgical History  Procedure Laterality Date  . Aortic valve replacement  1987    #21 MM ST JUDE PLACED BY DR.CHARLES WILSON  . Cataract extraction    . Transurethral  resection of prostate N/A 02/19/2013    Procedure: TRANSURETHRAL RESECTION OF THE PROSTATE /bladder WITH GYRUS INSTRUMENTS;  Surgeon: Irine Seal, MD;  Location: [facility];  Service: Urology;  Laterality: N/A;  . Cystoscopy N/A 02/19/2013    Procedure: CYSTOSCOPY;  Surgeon: Irine Seal, MD;  Location: [facility];  Service: Urology;  Laterality: N/A;   Family History  Problem Relation Age of Onset  . Cancer Mother   . Heart disease Father   . Asthma Father   . Stroke Father   . Dementia Sister   . Heart disease Brother    History  Substance Use Topics  . Smoking status: Former Smoker    Quit date: 01/10/1971  . Smokeless tobacco: Never Used  . Alcohol Use: No    Review of Systems  All systems reviewed and negative, other than as noted in HPI.   Allergies  Ace inhibitors; Codeine; and Sulfa drugs cross reactors  Home Medications   Current Outpatient Rx  Name  Route  Sig  Dispense  Refill  . carvedilol (COREG) 6.25 MG tablet   Oral   Take 6.25 mg by mouth 2 (two) times daily with a meal.         . doxycycline (VIBRAMYCIN) 100 MG capsule   Oral   Take 1 capsule (100 mg total) by mouth 2 (  two) times daily.   20 capsule   0   . finasteride (PROSCAR) 5 MG tablet   Oral   Take 5 mg by mouth every morning.          . furosemide (LASIX) 40 MG tablet      Take 1 tablet once daily as needed, may take an additional tablet in the afternoon if weight is over 2lbs.         . ondansetron (ZOFRAN-ODT) 4 MG disintegrating tablet   Oral   Take 4 mg by mouth every 6 (six) hours as needed for nausea or vomiting.         . phenazopyridine (PYRIDIUM) 100 MG tablet   Oral   Take 100 mg by mouth 3 (three) times daily as needed for pain.         . potassium chloride (KLOR-CON) 20 MEQ packet   Oral   Take 20 mEq by mouth 2 (two) times daily.         . simvastatin (ZOCOR) 10 MG tablet   Oral   Take 10 mg by mouth at bedtime.         [name] terazosin (HYTRIN) 5 MG capsule    Oral   Take 5 mg by mouth at bedtime.           . traMADol (ULTRAM) 50 MG tablet   Oral   Take 50 mg by mouth every 6 (six) hours as needed for moderate pain.         [name] warfarin (COUMADIN) 5 MG tablet      Beginning Tues, 03/11/13, take 1/2 tab (2.5mg ) once daily except every 4th day take 1 tab (5mg ) once daily.          BP 138/85  Pulse 65  Temp(Src) 97.8 F (36.6 C) (Oral)  Resp 16  SpO2 99% Physical Exam  Nursing note and vitals reviewed. Constitutional: He appears well-developed and well-nourished. No distress.  HENT:  Head: Normocephalic and atraumatic.  Eyes: Conjunctivae are normal. Right eye exhibits no discharge. Left eye exhibits no discharge.  Neck: Neck supple.  Cardiovascular: Normal rate, regular rhythm and normal heart sounds.  Exam reveals no gallop and no friction rub.   No murmur heard. Pulmonary/Chest: Effort normal and breath sounds normal. No respiratory distress.  Abdominal: Soft. He exhibits no distension. There is tenderness.  Mild suprapubic tenderness w/o rebound or guarding  Genitourinary:  Foley. Hematuria in bag with some clot. Urine in tubing noted to be pale yellow.   Musculoskeletal: He exhibits no edema and no tenderness.  Neurological: He is alert.  Skin: Skin is warm and dry.  Psychiatric: He has a normal mood and affect. His behavior is normal. Thought content normal.    ED Course  Procedures (including critical care time) Labs Review Labs Reviewed  URINALYSIS, ROUTINE W REFLEX MICROSCOPIC - Abnormal; Notable for the following:    APPearance CLOUDY (*)    Hgb urine dipstick LARGE (*)    Leukocytes, UA MODERATE (*)    All other components within normal limits  BASIC METABOLIC PANEL - Abnormal; Notable for the following:    Glucose, Bld 124 (*)    BUN 66 (*)    Creatinine, Ser 3.33 (*)    Calcium 8.2 (*)    GFR calc non Af Amer 15 (*)    GFR calc Af Amer 17 (*)    All other components within normal limits  CBC - Abnormal;  Notable for the following:  RBC 3.34 (*)    Hemoglobin 9.8 (*)    HCT 30.6 (*)    All other components within normal limits  PROTIME-INR - Abnormal; Notable for the following:    Prothrombin Time 31.9 (*)    INR 3.24 (*)    All other components within normal limits  PROTIME-INR - Abnormal; Notable for the following:    Prothrombin Time 29.8 (*)    INR 2.96 (*)    All other components within normal limits  CBC - Abnormal; Notable for the following:    RBC 3.26 (*)    Hemoglobin 9.2 (*)    HCT 30.0 (*)    All other components within normal limits  PROTIME-INR - Abnormal; Notable for the following:    Prothrombin Time 27.1 (*)    INR 2.62 (*)    All other components within normal limits  BASIC METABOLIC PANEL - Abnormal; Notable for the following:    Glucose, Bld 120 (*)    BUN 54 (*)    Creatinine, Ser 2.57 (*)    GFR calc non Af Amer 20 (*)    GFR calc Af Amer 24 (*)    All other components within normal limits  CBC - Abnormal; Notable for the following:    RBC 3.19 (*)    Hemoglobin 9.1 (*)    HCT 29.5 (*)    All other components within normal limits  URINE MICROSCOPIC-ADD ON   Imaging Review Dg Abd 2 Views  03/11/2013   CLINICAL DATA:  Abdominal pain and back pain  EXAM: ABDOMEN - 2 VIEW  COMPARISON:  None.  FINDINGS: Decubitus radiograph does not show evidence of free air. There is mild to moderate fecal retention throughout the proximal half of the colon. A rectal tube it is identified. There are no abnormally dilated loops of bowel.  IMPRESSION: Bowel gas pattern within normal limits   Electronically Signed   By: Skipper Cliche M.D.   On: 03/11/2013 17:30     EKG Interpretation None      MDM   Final diagnoses:  Acute renal failure  Chronic atrial fibrillation  S/P AVR (aortic valve replacement)  Chronic diastolic congestive heart failure- last EF 45-50%  Chronic anticoagulation    80 year old male again with worsening renal function. Foley in place and  appears to be functioning properly. Likely prerenal with recent poor by mouth intake. He is on Lasix and volume overloaded during last admit. Clinically, he does not appear to be volume overloaded currently. Gentle hydration particularly given his past history. Update weight. Will discuss with medicine team for admission.    Virgel Manifold, MD 03/13/13 [phone]

## 2013-03-10 NOTE — ED Notes (Signed)
Pt went to [facility] today for coumadin check and was called on the phone later stating that he needed to go to ED for obstruction of foley catheter and was told it can't wait until tomorrow.  Pt was discharged from [facility] last week for ARF and unable to urinate or have bowel movement. Pt had bladder cancer and had tumors removed on 02/19/13.  Pt was sent home with foley on 2/16 and re-admitted on 2/19 with larger size foley cath.   Pt passed large blood clot while transferring pt from wheelchair to stretcher and urine is noted in cath after clot passed.

## 2013-03-11 ENCOUNTER — Inpatient Hospital Stay (HOSPITAL_COMMUNITY): Payer: Medicare Other

## 2013-03-11 DIAGNOSIS — R627 Adult failure to thrive: Secondary | ICD-10-CM | POA: Diagnosis not present

## 2013-03-11 DIAGNOSIS — E43 Unspecified severe protein-calorie malnutrition: Secondary | ICD-10-CM | POA: Diagnosis present

## 2013-03-11 DIAGNOSIS — Z954 Presence of other heart-valve replacement: Secondary | ICD-10-CM

## 2013-03-11 DIAGNOSIS — I5032 Chronic diastolic (congestive) heart failure: Secondary | ICD-10-CM | POA: Diagnosis not present

## 2013-03-11 DIAGNOSIS — C679 Malignant neoplasm of bladder, unspecified: Secondary | ICD-10-CM | POA: Diagnosis not present

## 2013-03-11 DIAGNOSIS — N179 Acute kidney failure, unspecified: Secondary | ICD-10-CM | POA: Diagnosis not present

## 2013-03-11 DIAGNOSIS — D638 Anemia in other chronic diseases classified elsewhere: Secondary | ICD-10-CM | POA: Diagnosis not present

## 2013-03-11 DIAGNOSIS — R109 Unspecified abdominal pain: Secondary | ICD-10-CM | POA: Diagnosis not present

## 2013-03-11 LAB — PROTIME-INR
INR: 3.24 — ABNORMAL HIGH (ref 0.00–1.49)
Prothrombin Time: 31.9 seconds — ABNORMAL HIGH (ref 11.6–15.2)

## 2013-03-11 LAB — CBC
HEMATOCRIT: 30.6 % — AB (ref 39.0–52.0)
HEMOGLOBIN: 9.8 g/dL — AB (ref 13.0–17.0)
MCH: 29.3 pg (ref 26.0–34.0)
MCHC: 32 g/dL (ref 30.0–36.0)
MCV: 91.6 fL (ref 78.0–100.0)
Platelets: 214 10*3/uL (ref 150–400)
RBC: 3.34 MIL/uL — ABNORMAL LOW (ref 4.22–5.81)
RDW: 15.2 % (ref 11.5–15.5)
WBC: 9 10*3/uL (ref 4.0–10.5)

## 2013-03-11 LAB — BASIC METABOLIC PANEL
BUN: 66 mg/dL — ABNORMAL HIGH (ref 6–23)
CHLORIDE: 106 meq/L (ref 96–112)
CO2: 24 meq/L (ref 19–32)
Calcium: 8.2 mg/dL — ABNORMAL LOW (ref 8.4–10.5)
Creatinine, Ser: 3.33 mg/dL — ABNORMAL HIGH (ref 0.50–1.35)
GFR calc Af Amer: 17 mL/min — ABNORMAL LOW (ref >90)
GFR calc non Af Amer: 15 mL/min — ABNORMAL LOW (ref >90)
Glucose, Bld: 124 mg/dL — ABNORMAL HIGH (ref 70–99)
POTASSIUM: 3.9 meq/L (ref 3.7–5.3)
Sodium: 144 mEq/L (ref 137–147)

## 2013-03-11 MED ORDER — WARFARIN - PHARMACIST DOSING INPATIENT: Freq: Every day | Status: DC

## 2013-03-11 MED ORDER — ONDANSETRON 8 MG/NS 50 ML IVPB
8.0000 mg | Freq: Four times a day (QID) | INTRAVENOUS | Status: DC | PRN
  Filled 2013-03-11: qty 8

## 2013-03-11 MED ORDER — WARFARIN SODIUM 2.5 MG PO TABS
2.5000 mg | ORAL_TABLET | Freq: Once | ORAL | Status: DC
  Filled 2013-03-11: qty 1

## 2013-03-11 MED ORDER — ENSURE COMPLETE PO LIQD
237.0000 mL | Freq: Two times a day (BID) | ORAL | Status: DC
  Administered 2013-03-12 – 2013-03-15 (×5): 237 mL via ORAL

## 2013-03-11 MED ORDER — MORPHINE SULFATE 2 MG/ML IJ SOLN
0.5000 mg | INTRAMUSCULAR | Status: DC | PRN
  Administered 2013-03-11: 0.5 mg via INTRAVENOUS
  Filled 2013-03-11: qty 1

## 2013-03-11 NOTE — Progress Notes (Signed)
ANTICOAGULATION CONSULT NOTE - Initial Consult  Pharmacy Consult for warfarin Indication: atrial fibrillation, AVR  Allergies  Allergen Reactions  . Ace Inhibitors Other (See Comments)    Dizziness   . Codeine Nausea And Vomiting  . Sulfa Drugs Cross Reactors Nausea And Vomiting    Patient Measurements: Height: 5\' 9"  (175.3 cm) Weight: 154 lb 3.2 oz (69.945 kg) IBW/kg (Calculated) : 70.7   Vital Signs: Temp: 98 F (36.7 C) (03/03 1430) Temp src: Oral (03/03 1430) BP: 124/62 mmHg (03/03 1430) Pulse Rate: 100 (03/03 1430)  Labs:  Recent Labs  03/10/13 1040 03/10/13 1048 03/10/13 1049 03/11/13 0418  HGB  --  10.8*  --  9.8*  HCT  --  34.0*  --  30.6*  PLT  --  269.0  --  214  LABPROT  --   --   --  31.9*  INR 4.1  --   --  3.24*  CREATININE  --   --  4.4* 3.33*    Estimated Creatinine Clearance: 14.3 ml/min (by C-G formula based on Cr of 3.33).   Medical History: Past Medical History  Diagnosis Date  . Chronic atrial fibrillation   . Chronic anticoagulation     a. afib/st. jude avr - goal 2.5-3.5   . BPH (benign prostatic hypertrophy)   . Hypertension   . History of kidney stones   . Gout   . Syncope   . History of skin cancer   . Bladder tumor     a. s/p resection 02/19/2013.  [name] Hx of scarlet fever   . Seizures 2014    evaluated by Dr. Jannifer Franklin at [facility] neurology -  no cause determined - refered back to cardiologist - recommended loop recorder to monitor rythm but pt refused - continues to have seizures per family - notes in EPIC  . Chronic combined systolic and diastolic CHF (congestive heart failure)     a. EF 45 to 50% per echo in 2011 following exacerbation  . Chronic kidney disease     Medications:  Scheduled:  . carvedilol  6.25 mg Oral BID WC  . doxycycline  100 mg Oral BID  . feeding supplement (ENSURE COMPLETE)  237 mL Oral BID BM  . feeding supplement (ENSURE)  1 Container Oral TID BM  . finasteride  5 mg Oral q morning - 10a  .  pantoprazole  40 mg Oral Daily  . potassium chloride  20 mEq Oral BID  . simvastatin  10 mg Oral QHS  . terazosin  5 mg Oral QHS   Infusions:  . sodium chloride 100 mL/hr at 03/11/13 0430    Assessment: 47 yoM admitted 3/2 from PCP w nausea and abnormal labs. S/p bladder cancer resection in past month. On chronic warfarin PTA for atrial fibrillation and AVR. Pharmacy has been consulted to continue warfarin while inpatient.  Home dose beginning Tues, 03/11/13, take 1/2 tab (2.5mg ) once daily except 1 tab (5mg ) on M/F- previous dosing was 2.5mg  daily except 5mg  MWF  Last dose 3/1  INR 4.1 on 3/2 at outpatient clinic  Patient also on doxycycline outpatient (contined inpatient) which can increase INR  INR today within therapeutic range at 3.24  CBC is stable at patient's baseline  Goal of Therapy:  INR 2.5-3.5 Monitor platelets by anticoagulation protocol: Yes   Plan:  - warfarin 2.5mg  PO x 1 tonight - daily PT/INR - CBC in AM and at least q72h while in patient - pharmacy will follow-up daily  Thank  you for the consult.  Johny Drilling, PharmD, BCPS Pager: [phone] Pharmacy: [phone] 03/11/2013 2:57 PM

## 2013-03-11 NOTE — Consult Note (Signed)
CONSULT NOTE  Date: 03/11/2013               Patient Name:  Eric Wheeler MRN: [id number]  DOB: 1919/10/28 Age / Sex: 80 y.o., male        PCP: [name] Primary Cardiologist: Hochrein            Referring Physician: Charlies Silvers              Reason for Consult: CHF exacerbation in the setting of Worsening renal function,            History of Present Illness: Patient is a 80 y.o. male with a PMHx of chronic atrial fibrillation, aortic valve replacement at [facility]. Jude, hypertension, bladder tumor, chronic combined systolic and diastolic congestive heart failure, who was admitted to [facility] on 03/10/2013 for evaluation of  worsening renal function. His baseline creatinine is around 1.6. His creatinine on admission yesterday was 4.4.   Over the past month or so the patient has not feeling very well. His POi ntake has been very poor. He's had lots of nausea.  Despite his poor PO intake, he has continued his lasix and as a result, became volume depleted.    He is getting gentle hydration.  Denies any chest pain or dyspena.  Medications: Outpatient medications: Prescriptions prior to admission  Medication Sig Dispense Refill  . carvedilol (COREG) 6.25 MG tablet Take 6.25 mg by mouth 2 (two) times daily with a meal.      . doxycycline (VIBRAMYCIN) 100 MG capsule Take 1 capsule (100 mg total) by mouth 2 (two) times daily.  20 capsule  0  . finasteride (PROSCAR) 5 MG tablet Take 5 mg by mouth every morning.       . furosemide (LASIX) 40 MG tablet Take 1 tablet once daily as needed, may take an additional tablet in the afternoon if weight is over 2lbs.      . ondansetron (ZOFRAN-ODT) 4 MG disintegrating tablet Take 4 mg by mouth every 6 (six) hours as needed for nausea or vomiting.      . phenazopyridine (PYRIDIUM) 100 MG tablet Take 100 mg by mouth 3 (three) times daily as needed for pain.      . potassium chloride (KLOR-CON) 20 MEQ packet Take 20 mEq by mouth 2 (two) times daily.      .  simvastatin (ZOCOR) 10 MG tablet Take 10 mg by mouth at bedtime.      [name] terazosin (HYTRIN) 5 MG capsule Take 5 mg by mouth at bedtime.        . traMADol (ULTRAM) 50 MG tablet Take 50 mg by mouth every 6 (six) hours as needed for moderate pain.      [name] warfarin (COUMADIN) 5 MG tablet Beginning Tues, 03/11/13, take 1/2 tab (2.5mg ) once daily except every 4th day take 1 tab (5mg ) once daily.        Current medications: Current Facility-Administered Medications  Medication Dose Route Frequency Provider Last Rate Last Dose  . 0.9 %  sodium chloride infusion   Intravenous Continuous Virgel Manifold, MD 100 mL/hr at 03/11/13 0430    . alum & mag hydroxide-simeth (MAALOX/MYLANTA) 200-200-20 MG/5ML suspension 30 mL  30 mL Oral Q6H PRN Phillips Grout, MD      . carvedilol (COREG) tablet 6.25 mg  6.25 mg Oral BID WC Phillips Grout, MD   6.25 mg at 03/11/13 0810  . doxycycline (VIBRA-TABS) tablet 100 mg  100 mg  Oral BID Phillips Grout, MD   100 mg at 03/11/13 [id number]  . feeding supplement (ENSURE COMPLETE) (ENSURE COMPLETE) liquid 237 mL  237 mL Oral BID BM Hazle Coca, RD      . feeding supplement (ENSURE) (ENSURE) pudding 1 Container  1 Container Oral TID BM Phillips Grout, MD   1 Container at 03/11/13 [phone]  . finasteride (PROSCAR) tablet 5 mg  5 mg Oral q morning - 10a Phillips Grout, MD   5 mg at 03/11/13 [id number]  . ondansetron (ZOFRAN) 8 mg/NS 50 ml IVPB  8 mg Intravenous Q6H PRN Robbie Lis, MD      . pantoprazole (PROTONIX) EC tablet 40 mg  40 mg Oral Daily Phillips Grout, MD   40 mg at 03/11/13 0951  . potassium chloride SA (K-DUR,KLOR-CON) CR tablet 20 mEq  20 mEq Oral BID Phillips Grout, MD   20 mEq at 03/11/13 0951  . simvastatin (ZOCOR) tablet 10 mg  10 mg Oral QHS Phillips Grout, MD   10 mg at 03/11/13 0008  . terazosin (HYTRIN) capsule 5 mg  5 mg Oral QHS Phillips Grout, MD   5 mg at 03/11/13 0014  . traMADol (ULTRAM) tablet 50 mg  50 mg Oral Q6H PRN Phillips Grout, MD         Allergies    Allergen Reactions  . Ace Inhibitors Other (See Comments)    Dizziness   . Codeine Nausea And Vomiting  . Sulfa Drugs Cross Reactors Nausea And Vomiting     Past Medical History  Diagnosis Date  . Chronic atrial fibrillation   . Chronic anticoagulation     a. afib/st. jude avr - goal 2.5-3.5   . BPH (benign prostatic hypertrophy)   . Hypertension   . History of kidney stones   . Gout   . Syncope   . History of skin cancer   . Bladder tumor     a. s/p resection 02/19/2013.  [name] Hx of scarlet fever   . Seizures 2014    evaluated by Dr. Jannifer Franklin at [facility] neurology -  no cause determined - refered back to cardiologist - recommended loop recorder to monitor rythm but pt refused - continues to have seizures per family - notes in EPIC  . Chronic combined systolic and diastolic CHF (congestive heart failure)     a. EF 45 to 50% per echo in 2011 following exacerbation  . Chronic kidney disease     Past Surgical History  Procedure Laterality Date  . Aortic valve replacement  1987    #21 MM [facility] JUDE PLACED BY DR.CHARLES WILSON  . Cataract extraction    . Transurethral resection of prostate N/A 02/19/2013    Procedure: TRANSURETHRAL RESECTION OF THE PROSTATE /bladder WITH GYRUS INSTRUMENTS;  Surgeon: Irine Seal, MD;  Location: [facility];  Service: Urology;  Laterality: N/A;  . Cystoscopy N/A 02/19/2013    Procedure: CYSTOSCOPY;  Surgeon: Irine Seal, MD;  Location: [facility];  Service: Urology;  Laterality: N/A;    Family History  Problem Relation Age of Onset  . Cancer Mother   . Heart disease Father   . Asthma Father   . Stroke Father   . Dementia Sister   . Heart disease Brother     Social History:  reports that he quit smoking about 42 years ago. He has never used smokeless tobacco. He reports that he does not drink alcohol or use illicit drugs.  Review of Systems: Constitutional:  denies fever, chills, diaphoresis, appetite change and fatigue.  HEENT: denies photophobia, eye  pain, redness, hearing loss, ear pain, congestion, sore throat, rhinorrhea, sneezing, neck pain, neck stiffness and tinnitus.  Respiratory: denies SOB, DOE, cough, chest tightness, and wheezing.  Cardiovascular: denies chest pain, palpitations and leg swelling.  Gastrointestinal: admits to nausea, vomiting, abdominal pain, diarrhea, constipation,   Genitourinary: denies dysuria, urgency, frequency, hematuria, flank pain and difficulty urinating.  Musculoskeletal: denies  myalgias, back pain, joint swelling, arthralgias and gait problem.   Skin: denies pallor, rash and wound.  Neurological: denies dizziness, seizures, syncope, weakness, light-headedness, numbness and headaches.   Hematological: denies adenopathy, easy bruising, personal or family bleeding history.  Psychiatric/ Behavioral: denies suicidal ideation, mood changes, confusion, nervousness, sleep disturbance and agitation.    Physical Exam: BP 118/75  Pulse 78  Temp(Src) 97.6 F (36.4 C) (Oral)  Resp 16  Ht 5\' 9"  (1.753 m)  Wt 154 lb 3.2 oz (69.945 kg)  BMI 22.76 kg/m2  SpO2 98%  Wt Readings from Last 3 Encounters:  03/11/13 154 lb 3.2 oz (69.945 kg)  03/06/13 155 lb 6.8 oz (70.5 kg)  02/19/13 164 lb (74.39 kg)    General: Vital signs reviewed and noted. elderly  Head: Normocephalic, atraumatic, sclera anicteric,  Neck: Supple. Negative for carotid bruits. No JVD   Lungs:  Clear bilaterally, no  wheezes, rales, or rhonchi. Breathing is normal   Heart: Irreg. Irreg.  with S1  Mechanical S2  Abdomen:  Mildly distented,decreased breath sounds  MSK: weak  Extremities: No clubbing or cyanosis. No edema.  Distal pedal pulses are 2+ and equal   Neurologic: Alert and oriented X 3. Moves all extremities spontaneously.  Psych: Responds to questions appropriately with a normal affect.     Lab results: Basic Metabolic Panel:  Recent Labs Lab 03/06/13 0350 03/10/13 1049 03/11/13 0418  NA 140 141 144  K 2.9* 3.8 3.9    CL 98 102 106  CO2 28 28 24   GLUCOSE 163* 113* 124*  BUN 35* 70* 66*  CREATININE 1.64* 4.4* 3.33*  CALCIUM 8.2* 8.3* 8.2*    Liver Function Tests: No results found for this basename: AST, ALT, ALKPHOS, BILITOT, PROT, ALBUMIN,  in the last 168 hours No results found for this basename: LIPASE, AMYLASE,  in the last 168 hours No results found for this basename: AMMONIA,  in the last 168 hours  CBC:  Recent Labs Lab 03/05/13 0438 03/10/13 1048 03/11/13 0418  WBC 12.9* 13.9* 9.0  HGB 10.5* 10.8* 9.8*  HCT 33.8* 34.0* 30.6*  MCV 92.3 91.3 91.6  PLT 215 269.0 214    Cardiac Enzymes: No results found for this basename: CKTOTAL, CKMB, CKMBINDEX, TROPONINI,  in the last 168 hours  BNP: No components found with this basename: POCBNP,   CBG:  Recent Labs Lab 03/05/13 0736 03/06/13 0733  GLUCAP 122* 110*    Coagulation Studies:  Recent Labs  03/10/13 1040 03/11/13 0418  LABPROT  --  31.9*  INR 4.1 3.24*     Other results: EKG:  Atrial fib    Imaging:  No results found.    Assessment & Plan:  1. Hx of comb. Systolic and diastolic CHF:  At present,he is very volume depleted.  His PO intake has been poor since his bladder surgery but he has continued to take his lasix.  I think that he can tolerate being off his lasix for several days /  1 week to  allow him to rehydrate.  He is getting IV F now .  I would perhaps give him another 500 cc and then allow him to re-equilibrate on his own by encouraging him to eat / drink and holding the lasix.  If he is still not able to eat and drink, he may need more IVF but I would try to allow him to passively rehydrate.      2. Aortic valve replacement:  Goal INR is 3.0.  No hematura.  Pharmacy should be able to regulate.   3. Atrial fib:  Stable, chronic  4. Acute renal insufficiency:  Per medicine team.     Ramond Dial., MD, [facility] 03/11/2013, 11:16 AM Office - [phone] Pager [phone]

## 2013-03-11 NOTE — Progress Notes (Signed)
INITIAL NUTRITION ASSESSMENT  DOCUMENTATION CODES Per approved criteria  -Severe malnutrition in the context of chronic illness   INTERVENTION:  Continue Ensure Pudding TID, each supplement provides 170 kcal and 4 grams protein  Ensure Complete BID, each supplement provides 350 kcal and 13 grams protein  Consider liberalizing diet to encourage PO intake   NUTRITION DIAGNOSIS: Inadequate oral intake related to chronic illness as evidenced by documented PO intake and weight loss.   Goal: Patient to meet >/= 90% of estimated nutrition needs  Monitor:  PO intake and supplement acceptance, I/Os, weight trends, labs  Reason for Assessment: Malnutrition Screening Tool Risk  80 y.o. male  Admitting Dx: Acute renal failure  ASSESSMENT: 80 yo male dx with bladder cancer s/p resection last month sent in by pcp for abnormal labs. Since his surgery last month, he has been living at home with wife with indwelling foley catheter which has been draining clear urine. He suppose to see urology next week to set up getting further treatement for his bladder cancer. He has not been eating or drinking well, he says he is nauseated a lot. His stomach is upset a lot, which improves when he does eat, but he doesn't want to eat. No abd pain. Just nausea, no vomting. No fevers at home. No swelling. His cr is up to 4.4 today from 1.6. Is on lasix at home and continues to take this.   Patient endorsed a poor appetite due to nausea. Patient's wife and son, at bedside, stated that the patient is barely eating at home. Patient's wife stated that he will sometimes eat a good breakfast, but he rarely eats or drinks anything else throughout the day. Patient has lost 10 lbs since mid February. Pt with elevated Crt/BUN, monitor renal profile.   Dietetic intern encouraged patient to sip on supplements like Ensure or Boost at home, eat small meals throughout the day, increase intake of protein foods, and to eat when  feeling well.   Nutrition Focused Physical Exam:  Subcutaneous Fat:  Orbital Region: WNL Upper Arm Region: N/A Thoracic and Lumbar Region: N/A  Muscle:  Temple Region: mild depletion Clavicle Bone Region: mild depletion Clavicle and Acromion Bone Region: N/A Scapular Bone Region: N/A Dorsal Hand: N/A Patellar Region: N/A Anterior Thigh Region: N/A Posterior Calf Region: N/A  Edema: +1 RLE and LLE  Patient meets criteria for severe malnutrition in the context of chronic illness as evidenced by >5% weight loss in 1 month and energy intake </= 75% for >/= 1 month.    Height: Ht Readings from Last 1 Encounters:  03/10/13 5\' 9"  (1.753 m)    Weight: Wt Readings from Last 1 Encounters:  03/11/13 154 lb 3.2 oz (69.945 kg)    Ideal Body Weight: 160 lb (72.7 kg)  % Ideal Body Weight: 96%  Wt Readings from Last 10 Encounters:  03/11/13 154 lb 3.2 oz (69.945 kg)  03/06/13 155 lb 6.8 oz (70.5 kg)  02/19/13 164 lb (74.39 kg)  02/19/13 164 lb (74.39 kg)  02/12/13 164 lb (74.39 kg)  08/30/12 167 lb (75.751 kg)  04/09/12 176 lb (79.833 kg)  03/01/12 169 lb 12.8 oz (77.021 kg)  12/22/10 172 lb (78.019 kg)  08/22/10 173 lb 3.2 oz (78.563 kg)    Usual Body Weight: 162 lb (72.7 kg)  % Usual Body Weight: 95%  BMI:  Body mass index is 22.76 kg/(m^2).  Estimated Nutritional Needs: Kcal: 2100-2300 Protein: 85-95 grams Fluid: >2.2 L or per MD  Skin: Laceration on left heel  Diet Order: Cardiac  EDUCATION NEEDS: -No education needs identified at this time   Intake/Output Summary (Last 24 hours) at 03/11/13 0912 Last data filed at 03/11/13 0829  Gross per 24 hour  Intake 1228.33 ml  Output    850 ml  Net 378.33 ml    Last BM: PTA  Labs:   Recent Labs Lab 03/06/13 0350 03/10/13 1049 03/11/13 0418  NA 140 141 144  K 2.9* 3.8 3.9  CL 98 102 106  CO2 28 28 24   BUN 35* 70* 66*  CREATININE 1.64* 4.4* 3.33*  CALCIUM 8.2* 8.3* 8.2*  GLUCOSE 163* 113* 124*     CBG (last 3)  No results found for this basename: GLUCAP,  in the last 72 hours  Scheduled Meds: . carvedilol  6.25 mg Oral BID WC  . doxycycline  100 mg Oral BID  . feeding supplement (ENSURE)  1 Container Oral TID BM  . finasteride  5 mg Oral q morning - 10a  . pantoprazole  40 mg Oral Daily  . potassium chloride  20 mEq Oral BID  . simvastatin  10 mg Oral QHS  . terazosin  5 mg Oral QHS    Continuous Infusions: . sodium chloride 100 mL/hr at 03/11/13 0430    Past Medical History  Diagnosis Date  . Chronic atrial fibrillation   . Chronic anticoagulation     a. afib/st. jude avr - goal 2.5-3.5   . BPH (benign prostatic hypertrophy)   . Hypertension   . History of kidney stones   . Gout   . Syncope   . History of skin cancer   . Bladder tumor     a. s/p resection 02/19/2013.  [name] Hx of scarlet fever   . Seizures 2014    evaluated by Dr. Jannifer Franklin at [facility] neurology -  no cause determined - refered back to cardiologist - recommended loop recorder to monitor rythm but pt refused - continues to have seizures per family - notes in EPIC  . Chronic combined systolic and diastolic CHF (congestive heart failure)     a. EF 45 to 50% per echo in 2011 following exacerbation  . Chronic kidney disease     Past Surgical History  Procedure Laterality Date  . Aortic valve replacement  1987    #21 MM [facility] JUDE PLACED BY DR.CHARLES WILSON  . Cataract extraction    . Transurethral resection of prostate N/A 02/19/2013    Procedure: TRANSURETHRAL RESECTION OF THE PROSTATE /bladder WITH GYRUS INSTRUMENTS;  Surgeon: Irine Seal, MD;  Location: [facility];  Service: Urology;  Laterality: N/A;  . Cystoscopy N/A 02/19/2013    Procedure: CYSTOSCOPY;  Surgeon: Irine Seal, MD;  Location: [facility];  Service: Urology;  Laterality: N/A;    Claudell Kyle, Dietetic Intern Pager: [place] Clinical Dietitian [phone]

## 2013-03-11 NOTE — Progress Notes (Addendum)
[facility] PROGRESS NOTE  JAMAHL Wheeler MWU:[id number] DOB: 10/26/1919 DOA: 03/10/2013 PCP: [name], NP  Brief narrative: 80 year old male with past medical history of atrial fibrillation on Coumadin, CHF, St. Jude's AVR,  bladder cancer and recent resection in 02/2013 (by Dr. [name]), has indwelling foley catheter who presented to [facility] ED 03/10/2013 with poor oral intake, nausea and vomiting ever since his surgery. In ED, his vitals were stable but blood work revealed acute elevation in creatinine from baseline on 1.6 to now 4.4.  Assessment/Plan:  Principal Problem:   Acute renal failure - Likely secondary to Lasix which is now on hold. Possibility also includes urinary tract infection. Patient's urinalysis was significant for moderate leukocytes. Urine culture was not obtained at the time of the admission - on doxycycline 100 mg PO BID - Continue gentle hydration with normal saline at 50 cc an hour considering congestive heart failure (last 2 D ECHO on file showed EF 45%) Active Problems:   Chronic atrial fibrillation - On anticoagulation with Coumadin   Chronic diastolic congestive heart failure - last 2 D ECHO showed EF 45-50% - appreciate cardio consult - continue to hold lasix   Bladder cancer - s/p surgery 02/20/13 - called [facility] to inform them of pt admission, awaiting response    Anemia of chronic disease - Likely secondary to history of malignancy. - Hemoglobin stable at 9.8 - No indications for transfusion   Protein-calorie malnutrition, severe - Likely secondary to history of bladder cancer - Patient reports nausea but no vomiting. We will increase Zofran dosage to 8 mg IV as needed every 6 hours - Encourage by mouth intake as patient tolerates  S/P AVR /mechanical  - Coumadin per pharmacy  Code Status: DNR/DNI Family Communication: family at the bedside  Disposition Plan: home when stable; needs PT eval  [name], MD  [facility] Pager  [phone]  If 7PM-7AM, please contact night-coverage [email or web address] Password [email or web address] 03/11/2013, 1:13 PM   LOS: 1 day   Consultants:  Cardiology  Urology   Procedures:  None   Antibiotics:  Doxycycline   HPI/Subjective: Feels better this am.  Objective: Filed Vitals:   03/10/13 1852 03/10/13 2118 03/11/13 0013 03/11/13 0429  BP: 138/85 141/81 123/57 118/75  Pulse: 65 105 108 78  Temp: 97.8 F (36.6 C) 98.1 F (36.7 C)  97.6 F (36.4 C)  TempSrc: Oral Oral  Oral  Resp: 16 16  16   Height:  5\' 9"  (1.753 m)    Weight:  69.945 kg (154 lb 3.2 oz)  69.945 kg (154 lb 3.2 oz)  SpO2: 99% 100%  98%    Intake/Output Summary (Last 24 hours) at 03/11/13 1313 Last data filed at 03/11/13 0829  Gross per 24 hour  Intake 1228.33 ml  Output    850 ml  Net 378.33 ml    Exam:   General:  Pt is alert, follows commands appropriately, not in acute distress  Cardiovascular: irregular rhythm, rate controlled, S1(+), mechanical S2  Respiratory: Clear to auscultation bilaterally, no wheezing, no crackles, no rhonchi  Abdomen: Soft, non tender, non distended, bowel sounds present, no guarding  Extremities: No edema, pulses DP and PT palpable bilaterally  Neuro: Grossly nonfocal  Data Reviewed: Basic Metabolic Panel:  Recent Labs Lab 03/05/13 0438 03/06/13 0350 03/10/13 1049 03/11/13 0418  NA 144 140 141 144  K 3.4* 2.9* 3.8 3.9  CL 103 98 102 106  CO2 27 28 28 24   GLUCOSE 128* 163* 113* 124*  BUN 42* 35* 70* 66*  CREATININE 1.84* 1.64* 4.4* 3.33*  CALCIUM 8.3* 8.2* 8.3* 8.2*   Liver Function Tests: No results found for this basename: AST, ALT, ALKPHOS, BILITOT, PROT, ALBUMIN,  in the last 168 hours No results found for this basename: LIPASE, AMYLASE,  in the last 168 hours No results found for this basename: AMMONIA,  in the last 168 hours CBC:  Recent Labs Lab 03/05/13 0438 03/10/13 1048 03/11/13 0418  WBC 12.9* 13.9* 9.0  HGB 10.5* 10.8* 9.8*  HCT 33.8*  34.0* 30.6*  MCV 92.3 91.3 91.6  PLT 215 269.0 214   Cardiac Enzymes: No results found for this basename: CKTOTAL, CKMB, CKMBINDEX, TROPONINI,  in the last 168 hours BNP: No components found with this basename: POCBNP,  CBG:  Recent Labs Lab 03/05/13 0736 03/06/13 0733  GLUCAP 122* 110*    No results found for this or any previous visit (from the past 240 hour(s)).   Studies: No results found.  Scheduled Meds: . carvedilol  6.25 mg Oral BID WC  . doxycycline  100 mg Oral BID  . feeding supplement   237 mL Oral BID BM  . feeding supplement  1 Container Oral TID BM  . finasteride  5 mg Oral q morning - 10a  . pantoprazole  40 mg Oral Daily  . potassium chloride  20 mEq Oral BID  . simvastatin  10 mg Oral QHS  . terazosin  5 mg Oral QHS

## 2013-03-11 NOTE — Progress Notes (Signed)
PT Cancellation Note  Patient Details Name: Eric Wheeler MRN: [id number] DOB: 1919/02/15   Cancelled Treatment:    Reason Eval/Treat Not Completed: Patient declined, no reason specified   Weston Anna, MPT Pager: [phone]

## 2013-03-11 NOTE — Care Management Note (Signed)
   CARE MANAGEMENT NOTE 03/11/2013  Patient:  Eric Wheeler, Eric Wheeler   Account Number:  [id number]  Date Initiated:  03/11/2013  Documentation initiated by:  Anothony Bursch  Subjective/Objective Assessment:   80 yo male admitted with acute renal failure and failure to thrive     Action/Plan:   Anticipated DC Date:     Anticipated DC Plan:        DC Planning Services  CM consult      Choice offered to / List presented to:  [name] Patient   DME arranged  NA      DME agency  NA     [place] arranged  NA      [place] agency  NA   Status of service:  In process, will continue to follow Medicare Important Message given?   (If response is "NO", the following Medicare IM given date fields will be blank) Date Medicare IM given:   Date Additional Medicare IM given:    Discharge Disposition:    Per UR Regulation:  Reviewed for med. necessity/level of care/duration of stay  If discussed at [place] of Stay Meetings, dates discussed:    Comments:  03/11/13 [place] Chart reviewed for utilization of services. PCP: Eric Merle, NP. Pt from home with spouse. Awaiting PT eval to further assess dc needs.

## 2013-03-12 DIAGNOSIS — I5032 Chronic diastolic (congestive) heart failure: Secondary | ICD-10-CM | POA: Diagnosis not present

## 2013-03-12 DIAGNOSIS — R339 Retention of urine, unspecified: Secondary | ICD-10-CM | POA: Diagnosis not present

## 2013-03-12 DIAGNOSIS — R944 Abnormal results of kidney function studies: Secondary | ICD-10-CM | POA: Diagnosis not present

## 2013-03-12 DIAGNOSIS — R319 Hematuria, unspecified: Secondary | ICD-10-CM | POA: Diagnosis not present

## 2013-03-12 DIAGNOSIS — N179 Acute kidney failure, unspecified: Secondary | ICD-10-CM | POA: Diagnosis not present

## 2013-03-12 DIAGNOSIS — C679 Malignant neoplasm of bladder, unspecified: Secondary | ICD-10-CM | POA: Diagnosis not present

## 2013-03-12 DIAGNOSIS — D638 Anemia in other chronic diseases classified elsewhere: Secondary | ICD-10-CM | POA: Diagnosis not present

## 2013-03-12 LAB — CBC
HCT: 30 % — ABNORMAL LOW (ref 39.0–52.0)
Hemoglobin: 9.2 g/dL — ABNORMAL LOW (ref 13.0–17.0)
MCH: 28.2 pg (ref 26.0–34.0)
MCHC: 30.7 g/dL (ref 30.0–36.0)
MCV: 92 fL (ref 78.0–100.0)
PLATELETS: 200 10*3/uL (ref 150–400)
RBC: 3.26 MIL/uL — AB (ref 4.22–5.81)
RDW: 15 % (ref 11.5–15.5)
WBC: 8.7 10*3/uL (ref 4.0–10.5)

## 2013-03-12 LAB — PROTIME-INR
INR: 2.96 — AB (ref 0.00–1.49)
PROTHROMBIN TIME: 29.8 s — AB (ref 11.6–15.2)

## 2013-03-12 MED ORDER — WARFARIN SODIUM 5 MG PO TABS
5.0000 mg | ORAL_TABLET | Freq: Once | ORAL | Status: DC
  Filled 2013-03-12: qty 1

## 2013-03-12 MED ORDER — DOCUSATE SODIUM 100 MG PO CAPS
100.0000 mg | ORAL_CAPSULE | Freq: Two times a day (BID) | ORAL | Status: DC | PRN
  Administered 2013-03-12 – 2013-03-13 (×3): 100 mg via ORAL
  Filled 2013-03-12 (×2): qty 1

## 2013-03-12 NOTE — Progress Notes (Addendum)
[facility] PROGRESS NOTE  KAIAN FAHS LPF:[id number] DOB: October 01, 1919 DOA: 03/10/2013 PCP: [name], NP  Brief narrative: 80 year old male with past medical history of atrial fibrillation on Coumadin, CHF, St. Jude's AVR, bladder cancer and recent resection in 02/2013 (by Dr. [name]), has indwelling foley catheter who presented to [facility] ED 03/10/2013 with poor oral intake, nausea and vomiting ever since his surgery. In ED, his vitals were stable but blood work revealed acute elevation in creatinine from baseline on 1.6 to now 4.4.   Assessment/Plan:   Principal Problem:  Acute renal failure  - Likely secondary to Lasix which is now on hold. Possibility also includes urinary tract infection. Patient's urinalysis was significant for moderate leukocytes. Urine culture was not obtained at the time of the admission  - on doxycycline 100 mg PO BID started 03/06/2013 which will be continued for total of 10 days - Continue gentle hydration with normal saline at 50 cc an hour considering congestive heart failure (last 2 D ECHO on file showed EF 45%)  Active Problems:  Chronic atrial fibrillation  - On anticoagulation with Coumadin  - has hematuria so will see what GU thinks in regards to continuing AC. Per cardio, this hematuria may still be from surgery so they recommended continuing coumadin for now with goal INR 3.5 but may be lower goal if continues to have hematuria  Chronic diastolic congestive heart failure  - last 2 D ECHO showed EF 45-50% (done 03/03/2013) - appreciate cardio consult and their recommendations  - continue to hold lasix  Bladder cancer  - s/p surgery 02/20/13  - GU called for consult; appreciate GU following Anemia of chronic disease  - Likely secondary to history of malignancy.  - Hemoglobin stable at 9.8, 9.2 - No indications for transfusion  Protein-calorie malnutrition, severe  - Likely secondary to history of bladder cancer  - Patient reports nausea but no  vomiting. Continue Zofran  8 mg IV as needed every 6 hours  - Encourage by mouth intake as patient tolerates  S/P AVR /mechanical  - Coumadin per pharmacy   Code Status: DNR/DNI  Family Communication: family at the bedside  Disposition Plan: home when stable  Consultants:  Cardiology  Urology  Procedures:  None  Antibiotics:  Doxycycline 03/06/2013 --> plan to continue for 10 days from starting day   [name], MD  [facility] Pager [phone]  If 7PM-7AM, please contact night-coverage [email or web address] Password [facility] 03/12/2013, 9:42 AM   LOS: 2 days    HPI/Subjective: No overnight events.   Objective: Filed Vitals:   03/11/13 1430 03/11/13 2053 03/11/13 2230 03/12/13 0537  BP: 124/62 107/54 113/85 108/65  Pulse: 100 98 112 97  Temp: 98 F (36.7 C) 98.4 F (36.9 C)  97.8 F (36.6 C)  TempSrc: Oral Oral  Oral  Resp: 20 20  20   Height:      Weight:    71.351 kg (157 lb 4.8 oz)  SpO2: 99% 99%  99%    Intake/Output Summary (Last 24 hours) at 03/12/13 0942 Last data filed at 03/12/13 0600  Gross per 24 hour  Intake 1988.33 ml  Output   1950 ml  Net  38.33 ml    Exam:  General: Pt is sleeping this am, no acute distress Cardiovascular: irregular rhythm, rate controlled, S1(+), mechanical S2 Respiratory: Clear to auscultation bilaterally, no wheezing, no crackles, no rhonchi  Abdomen: Soft, non tender, non distended, bowel sounds present, no guarding  Extremities: No edema, pulses  DP and PT palpable bilaterally  Neuro: Grossly nonfocal   Data Reviewed: Basic Metabolic Panel:  Recent Labs Lab 03/06/13 0350 03/10/13 1049 03/11/13 0418  NA 140 141 144  K 2.9* 3.8 3.9  CL 98 102 106  CO2 28 28 24   GLUCOSE 163* 113* 124*  BUN 35* 70* 66*  CREATININE 1.64* 4.4* 3.33*  CALCIUM 8.2* 8.3* 8.2*   Liver Function Tests: No results found for this basename: AST, ALT, ALKPHOS, BILITOT, PROT, ALBUMIN,  in the last 168 hours No results found for this  basename: LIPASE, AMYLASE,  in the last 168 hours No results found for this basename: AMMONIA,  in the last 168 hours CBC:  Recent Labs Lab 03/10/13 1048 03/11/13 0418 03/12/13 0315  WBC 13.9* 9.0 8.7  HGB 10.8* 9.8* 9.2*  HCT 34.0* 30.6* 30.0*  MCV 91.3 91.6 92.0  PLT 269.0 214 200   Cardiac Enzymes: No results found for this basename: CKTOTAL, CKMB, CKMBINDEX, TROPONINI,  in the last 168 hours BNP: No components found with this basename: POCBNP,  CBG:  Recent Labs Lab 03/06/13 0733  GLUCAP 110*    No results found for this or any previous visit (from the past 240 hour(s)).   Studies: Dg Abd 2 Views 03/11/2013    IMPRESSION: Bowel gas pattern within normal limits      Scheduled Meds: . carvedilol  6.25 mg Oral BID WC  . doxycycline  100 mg Oral BID  . finasteride  5 mg Oral q morning - 10a  . pantoprazole  40 mg Oral Daily  . potassium chloride  20 mEq Oral BID  . simvastatin  10 mg Oral QHS  . terazosin  5 mg Oral QHS  . warfarin  2.5 mg Oral ONCE-1800

## 2013-03-12 NOTE — Progress Notes (Signed)
ANTICOAGULATION CONSULT NOTE - Follow up  Pharmacy Consult for warfarin Indication: atrial fibrillation, AVR  Allergies  Allergen Reactions  . Ace Inhibitors Other (See Comments)    Dizziness   . Codeine Nausea And Vomiting  . Sulfa Drugs Cross Reactors Nausea And Vomiting    Patient Measurements: Height: 5\' 9"  (175.3 cm) Weight: 157 lb 4.8 oz (71.351 kg) IBW/kg (Calculated) : 70.7   Vital Signs: Temp: 97.8 F (36.6 C) (03/04 0537) Temp src: Oral (03/04 0537) BP: 108/65 mmHg (03/04 0537) Pulse Rate: 97 (03/04 0537)  Labs:  Recent Labs  03/10/13 1040  03/10/13 1048 03/10/13 1049 03/11/13 0418 03/12/13 0315  HGB  --   < > 10.8*  --  9.8* 9.2*  HCT  --   --  34.0*  --  30.6* 30.0*  PLT  --   --  269.0  --  214 200  LABPROT  --   --   --   --  31.9* 29.8*  INR 4.1  --   --   --  3.24* 2.96*  CREATININE  --   --   --  4.4* 3.33*  --   < > = values in this interval not displayed.  Estimated Creatinine Clearance: 14.4 ml/min (by C-G formula based on Cr of 3.33).   Assessment: 89 yoM admitted 3/2 from PCP w nausea and abnormal labs. S/p bladder cancer resection in past month. On chronic warfarin PTA for atrial fibrillation and AVR. Pharmacy has been consulted to continue warfarin while inpatient.  Home dose beginning Tues, 03/11/13, take 1/2 tab (2.5mg ) once daily except 1 tab (5mg ) on M/F- previous dosing was 2.5mg  daily except 5mg  MWF  Last dose 3/1  INR 4.1 on 3/2 at outpatient clinic  Patient also on doxycycline outpatient (contined inpatient) which can increase INR, started 2/26, planned for 10 day course  INR today remains within therapeutic range at 2.96  2.5mg  dose ordered for last night not charted, unknown if it was given  Hgb slightly decreased, plts ok  Patient noted to be passing blood clots from bladder- [facility] MD d/w Urology MD who suggests holding warfarin currently  Goal of Therapy:  INR 2.5-3.5 Monitor platelets by anticoagulation protocol:  Yes   Plan:  - No warfarin tonight - daily PT/INR - f/u AM CBC - follow up bleeding - pharmacy will follow-up daily  Thank you for the consult.  Johny Drilling, PharmD, BCPS Pager: [phone] Pharmacy: [phone] 03/12/2013 12:07 PM

## 2013-03-12 NOTE — Progress Notes (Signed)
Addendum: got in touch with Dr. [name] who recommended holding coumadin now that pt developed hematuria. Coumadin discontinued. [name] [facility] [phone]

## 2013-03-12 NOTE — Progress Notes (Signed)
Patient ID: Eric Wheeler, male   DOB: 1919/10/14, 80 y.o.   MRN: [id number] [name] is back in because his Cr went from 1.64 to 4.44.   His foley has been draining well but he had a few clots this morning on warfarin but the urine is clear in the tube today.  His Cr is down to 3.3.  His UOP appears good.  He continues to have a feeling of SP fullness.  I have reviewed his labs.  Exam:  SP area firm but non-tender.  Imp:  Recurrent acute on chronic renal insufficiency.          History of postop retention following TURBT for bladder CA.  Rec:  Bladder scan to make sure foley is emptying.          Trial without catheter in am.           Hold warfarin for hematuria.

## 2013-03-12 NOTE — Progress Notes (Signed)
       Patient Name: Eric Wheeler Date of Encounter: 03/12/2013    SUBJECTIVE: No dyspnea  TELEMETRY:  AF with controlled rate Filed Vitals:   03/11/13 1430 03/11/13 2053 03/11/13 2230 03/12/13 0537  BP: 124/62 107/54 113/85 108/65  Pulse: 100 98 112 97  Temp: 98 F (36.7 C) 98.4 F (36.9 C)  97.8 F (36.6 C)  TempSrc: Oral Oral  Oral  Resp: 20 20  20   Height:      Weight:    157 lb 4.8 oz (71.351 kg)  SpO2: 99% 99%  99%    Intake/Output Summary (Last 24 hours) at 03/12/13 0651 Last data filed at 03/12/13 0600  Gross per 24 hour  Intake 2228.33 ml  Output   1950 ml  Net 278.33 ml    LABS: Basic Metabolic Panel:  Recent Labs  03/10/13 1049 03/11/13 0418  NA 141 144  K 3.8 3.9  CL 102 106  CO2 28 24  GLUCOSE 113* 124*  BUN 70* 66*  CREATININE 4.4* 3.33*  CALCIUM 8.3* 8.2*   CBC:  Recent Labs  03/11/13 0418 03/12/13 0315  WBC 9.0 8.7  HGB 9.8* 9.2*  HCT 30.6* 30.0*  MCV 91.6 92.0  PLT 214 200   Radiology/Studies:  CXR with CE but no CHF  Physical Exam: Blood pressure 108/65, pulse 97, temperature 97.8 F (36.6 C), temperature source Oral, resp. rate 20, height 5\' 9"  (1.753 m), weight 157 lb 4.8 oz (71.351 kg), SpO2 99.00%. Weight change: 3 lb 1.6 oz (1.406 kg)   Lungs clear Neck veins flat  ASSESSMENT:  1. Intravascular volume depletion  Plan:  1. Hold diuretics 2. I endorse the recommendations of Dr Eric Wheeler on the initial consult.  Eric Wheeler 03/12/2013, 6:51 AM

## 2013-03-12 NOTE — Progress Notes (Signed)
Bladder scan done, noted 0 ml,patient is comfortable,no c/o bladder pressure.Sandie Ano RN

## 2013-03-12 NOTE — Evaluation (Signed)
Physical Therapy Evaluation Patient Details Name: Eric Wheeler MRN: [id number] DOB: 27-Dec-1919 Today's Date: 03/12/2013 Time: 3664-4034 PT Time Calculation (min): 15 min  PT Assessment / Plan / Recommendation History of Present Illness  80 year old male with past medical history of atrial fibrillation on Coumadin, CHF, St. Jude's AVR,  bladder cancer and recent resection in 02/2013 (by Dr. [name]), has indwelling foley catheter who presented to [facility] ED 03/10/2013 with poor oral intake, nausea and vomiting ever since his surgery. In ED, his vitals were stable but blood work revealed acute elevation in creatinine from baseline on 1.6 to now 4.4.  Admitted for acute renal failure  Clinical Impression  Pt admitted with above. Pt currently with functional limitations due to the deficits listed below (see PT Problem List).  Pt will benefit from skilled PT to increase their independence and safety with mobility to allow discharge to the venue listed below.  Pt known to this PT from previous admission.  Pt presents as weaker and with decreased endurance however still able to ambulate in hallway with RW (normally uses SPC).      PT Assessment  Patient needs continued PT services    Follow Up Recommendations  Home health PT    Does the patient have the potential to tolerate intense rehabilitation      Barriers to Discharge        Equipment Recommendations  None recommended by PT    Recommendations for Other Services     Frequency Min 3X/week    Precautions / Restrictions Precautions Precautions: Fall   Pertinent Vitals/Pain n/a      Mobility  Bed Mobility Overal bed mobility: Needs Assistance Bed Mobility: Supine to Sit Supine to sit: Min assist General bed mobility comments: increased time, assist for trunk Transfers Overall transfer level: Needs assistance Equipment used: Rolling walker (2 wheeled) Transfers: Sit to/from Stand Sit to Stand: Min guard General transfer comment:  verbal cues to wait for RW in front of pt prior to standing as he c/o increased weakness Ambulation/Gait Ambulation/Gait assistance: Min guard Ambulation Distance (Feet): 120 Feet Assistive device: Rolling walker (2 wheeled) Gait Pattern/deviations: Step-through pattern;Trunk flexed General Gait Details: utilized RW as pt feeling weaker since last admission    Exercises     PT Diagnosis: Difficulty walking;Generalized weakness  PT Problem List: Decreased activity tolerance;Decreased balance;Decreased mobility;Decreased strength;Decreased knowledge of use of DME PT Treatment Interventions: DME instruction;Gait training;Functional mobility training;Therapeutic activities;Therapeutic exercise;Patient/family education;Balance training     PT Goals(Current goals can be found in the care plan section) Acute Rehab PT Goals PT Goal Formulation: With patient Time For Goal Achievement: 03/26/13 Potential to Achieve Goals: Good  Visit Information  Last PT Received On: 03/12/13 Assistance Needed: +1 History of Present Illness: 80 year old male with past medical history of atrial fibrillation on Coumadin, CHF, St. Jude's AVR,  bladder cancer and recent resection in 02/2013 (by Dr. [name]), has indwelling foley catheter who presented to [facility] ED 03/10/2013 with poor oral intake, nausea and vomiting ever since his surgery. In ED, his vitals were stable but blood work revealed acute elevation in creatinine from baseline on 1.6 to now 4.4.  Admitted for acute renal failure       Prior [place] expects to be discharged to:: Private residence Living Arrangements: Spouse/significant other Available Help at Discharge: Family Type of Home: House Home Access: Stairs to enter Technical brewer of Steps: 1 Home Layout: One [place]: [place] - single  point;Walker - 2 wheels Prior Function Level of Independence: Independent with assistive  device(s) Communication Communication: HOH    Cognition  Cognition Arousal/Alertness: Awake/alert Behavior During Therapy: WFL for tasks assessed/performed Overall Cognitive Status: Within Functional Limits for tasks assessed    Extremity/Trunk Assessment Lower Extremity Assessment Lower Extremity Assessment: Generalized weakness   Balance    End of Session PT - End of Session Activity Tolerance: Patient limited by fatigue Patient left: in bed;with call bell/phone within reach;with family/visitor present  GP     Gavriela Cashin,KATHrine E 03/12/2013, 12:02 PM Carmelia Bake, PT, DPT 03/12/2013 Pager: [phone]

## 2013-03-13 DIAGNOSIS — C679 Malignant neoplasm of bladder, unspecified: Secondary | ICD-10-CM | POA: Diagnosis not present

## 2013-03-13 DIAGNOSIS — I4891 Unspecified atrial fibrillation: Secondary | ICD-10-CM | POA: Diagnosis not present

## 2013-03-13 DIAGNOSIS — I5032 Chronic diastolic (congestive) heart failure: Secondary | ICD-10-CM | POA: Diagnosis not present

## 2013-03-13 DIAGNOSIS — R339 Retention of urine, unspecified: Secondary | ICD-10-CM | POA: Diagnosis not present

## 2013-03-13 DIAGNOSIS — R319 Hematuria, unspecified: Secondary | ICD-10-CM | POA: Diagnosis not present

## 2013-03-13 DIAGNOSIS — N179 Acute kidney failure, unspecified: Secondary | ICD-10-CM | POA: Diagnosis not present

## 2013-03-13 DIAGNOSIS — Z7901 Long term (current) use of anticoagulants: Secondary | ICD-10-CM | POA: Diagnosis not present

## 2013-03-13 DIAGNOSIS — R944 Abnormal results of kidney function studies: Secondary | ICD-10-CM | POA: Diagnosis not present

## 2013-03-13 DIAGNOSIS — D638 Anemia in other chronic diseases classified elsewhere: Secondary | ICD-10-CM | POA: Diagnosis not present

## 2013-03-13 LAB — BASIC METABOLIC PANEL
BUN: 54 mg/dL — AB (ref 6–23)
CO2: 24 mEq/L (ref 19–32)
Calcium: 8.5 mg/dL (ref 8.4–10.5)
Chloride: 108 mEq/L (ref 96–112)
Creatinine, Ser: 2.57 mg/dL — ABNORMAL HIGH (ref 0.50–1.35)
GFR calc Af Amer: 24 mL/min — ABNORMAL LOW (ref >90)
GFR, EST NON AFRICAN AMERICAN: 20 mL/min — AB (ref >90)
Glucose, Bld: 120 mg/dL — ABNORMAL HIGH (ref 70–99)
POTASSIUM: 4.5 meq/L (ref 3.7–5.3)
SODIUM: 142 meq/L (ref 137–147)

## 2013-03-13 LAB — CBC
HCT: 29.5 % — ABNORMAL LOW (ref 39.0–52.0)
Hemoglobin: 9.1 g/dL — ABNORMAL LOW (ref 13.0–17.0)
MCH: 28.5 pg (ref 26.0–34.0)
MCHC: 30.8 g/dL (ref 30.0–36.0)
MCV: 92.5 fL (ref 78.0–100.0)
Platelets: 200 10*3/uL (ref 150–400)
RBC: 3.19 MIL/uL — AB (ref 4.22–5.81)
RDW: 15 % (ref 11.5–15.5)
WBC: 9.9 10*3/uL (ref 4.0–10.5)

## 2013-03-13 LAB — PROTIME-INR
INR: 2.62 — AB (ref 0.00–1.49)
Prothrombin Time: 27.1 seconds — ABNORMAL HIGH (ref 11.6–15.2)

## 2013-03-13 MED ORDER — MEGESTROL ACETATE 400 MG/10ML PO SUSP
200.0000 mg | Freq: Two times a day (BID) | ORAL | Status: DC
  Administered 2013-03-13 – 2013-03-15 (×4): 200 mg via ORAL
  Filled 2013-03-13 (×6): qty 5

## 2013-03-13 NOTE — Progress Notes (Signed)
Patient ID: Eric Wheeler, male   DOB: 06-20-1919, 80 y.o.   MRN: [id number]    Subjective: Eric Wheeler's foley was removed this morning.   He had an episode of incontinence but has not had a controlled void.    He doesn't complain of urgency but some mild SP fullness.   His Cr is declining.  ROS: Negative except as above.      Objective: Vital signs in last 24 hours: Temp:  [97.9 F (36.6 C)-98.4 F (36.9 C)] 98.1 F (36.7 C) (03/05 0427) Pulse Rate:  [94-100] 96 (03/05 0427) Resp:  [20] 20 (03/05 0427) BP: (130-131)/(69-76) 131/76 mmHg (03/05 0427) SpO2:  [99 %] 99 % (03/05 0427) Weight:  [70.171 kg (154 lb 11.2 oz)] 70.171 kg (154 lb 11.2 oz) (03/05 0553)  Intake/Output from previous day: 03/04 0701 - 03/05 0700 In: 240 [P.O.:240] Out: 1300 [Urine:1300] Intake/Output this shift: Total I/O In: 240 [P.O.:240] Out: -   General appearance: alert and no distress GI: His lower abdomen remains firm but non-tender and is really no different that  it was with the foley in place.    Lab Results:   Recent Labs  03/12/13 0315 03/13/13 0333  WBC 8.7 9.9  HGB 9.2* 9.1*  HCT 30.0* 29.5*  PLT 200 200   BMET  Recent Labs  03-21-2013 0418 03/13/13 0333  NA 144 142  K 3.9 4.5  CL 106 108  CO2 24 24  GLUCOSE 124* 120*  BUN 66* 54*  CREATININE 3.33* 2.57*  CALCIUM 8.2* 8.5   PT/INR  Recent Labs  03/12/13 0315 03/13/13 0333  LABPROT 29.8* 27.1*  INR 2.96* 2.62*   ABG No results found for this basename: PHART, PCO2, PO2, HCO3,  in the last 72 hours  Studies/Results: Dg Abd 2 Views  03-21-2013   CLINICAL DATA:  Abdominal pain and back pain  EXAM: ABDOMEN - 2 VIEW  COMPARISON:  None.  FINDINGS: Decubitus radiograph does not show evidence of free air. There is mild to moderate fecal retention throughout the proximal half of the colon. A rectal tube it is identified. There are no abnormally dilated loops of bowel.  IMPRESSION: Bowel gas pattern within normal limits    Electronically Signed   By: Skipper Cliche M.D.   On: March 21, 2013 17:30    Anti-infectives: Anti-infectives   Start     Dose/Rate Route Frequency Ordered Stop   03/10/13 2330  doxycycline (VIBRA-TABS) tablet 100 mg     100 mg Oral 2 times daily 03/10/13 2301        Current Facility-Administered Medications  Medication Dose Route Frequency Provider Last Rate Last Dose  . alum & mag hydroxide-simeth (MAALOX/MYLANTA) 200-200-20 MG/5ML suspension 30 mL  30 mL Oral Q6H PRN Phillips Grout, MD      . carvedilol (COREG) tablet 6.25 mg  6.25 mg Oral BID WC Phillips Grout, MD   6.25 mg at 03/12/13 1643  . docusate sodium (COLACE) capsule 100 mg  100 mg Oral BID PRN Robbie Lis, MD   100 mg at 03/12/13 2317  . doxycycline (VIBRA-TABS) tablet 100 mg  100 mg Oral BID Phillips Grout, MD   100 mg at 03/12/13 2109  . feeding supplement (ENSURE COMPLETE) (ENSURE COMPLETE) liquid 237 mL  237 mL Oral BID BM Hazle Coca, RD   237 mL at 03/12/13 1404  . feeding supplement (ENSURE) (ENSURE) pudding 1 Container  1 Container Oral TID BM Phillips Grout, MD  1 Container at 03/12/13 1400  . finasteride (PROSCAR) tablet 5 mg  5 mg Oral q morning - 10a Phillips Grout, MD   5 mg at 03/12/13 0931  . morphine 2 MG/ML injection 0.5 mg  0.5 mg Intravenous Q4H PRN Robbie Lis, MD   0.5 mg at 03/11/13 1547  . ondansetron (ZOFRAN) 8 mg/NS 50 ml IVPB  8 mg Intravenous Q6H PRN Robbie Lis, MD      . pantoprazole (PROTONIX) EC tablet 40 mg  40 mg Oral Daily Phillips Grout, MD   40 mg at 03/12/13 0931  . simvastatin (ZOCOR) tablet 10 mg  10 mg Oral QHS Phillips Grout, MD   10 mg at 03/12/13 2109  . terazosin (HYTRIN) capsule 5 mg  5 mg Oral QHS Phillips Grout, MD   5 mg at 03/12/13 2109  . traMADol (ULTRAM) tablet 50 mg  50 mg Oral Q6H PRN Phillips Grout, MD      . Warfarin - Pharmacist Dosing Inpatient   Does not apply [place], [facility]        Assessment: Bladder cancer with post-op retention and  hematuria.   He has had a single incontinent void since foley removal.  ARI improving.   Plan: I will order PVR checks and the foley will need to be replaced if he is unable to void adequately.       LOS: 3 days    [name] 03/13/2013

## 2013-03-13 NOTE — Progress Notes (Addendum)
No dyspnea. O2 sats are normal Renal function improving. Continue to hold diuretics. Will hold potassium while not on diuretics. Agree with holding coumadin, but remember he has a mechanical valve!

## 2013-03-13 NOTE — Progress Notes (Addendum)
[facility] PROGRESS NOTE  Eric Wheeler NID:[id number] DOB: 1919-09-06 DOA: 03/10/2013 PCP: [name], NP  Brief narrative: 80 year old male with past medical history of atrial fibrillation on Coumadin, CHF, St. Jude's AVR, bladder cancer and recent resection in 02/2013 (by Dr. [name]), has indwelling foley catheter who presented to [facility] ED 03/10/2013 with poor oral intake, nausea and vomiting ever since his surgery. In ED, his vitals were stable but blood work revealed acute elevation in creatinine from baseline on 1.6 to now 4.4.   Assessment/Plan:   Principal Problem:  Acute renal failure  - Likely secondary to Lasix which is now on hold. Possibility also includes urinary tract infection. Patient's urinalysis was significant for moderate leukocytes. Urine culture was not obtained at the time of the admission  - on doxycycline 100 mg PO BID started 03/06/2013 which will be continued for total of 10 days  - Will stop IV fluids, pt is drinking fluids on his own Active Problems:  Chronic atrial fibrillation  - On anticoagulation with Coumadin; this was put on hold yesterday 3/4 due to hematuria. If no further episodes of bleeding we will restart coumadin  Chronic diastolic congestive heart failure  - last 2 D ECHO showed EF 45-50% (done 03/03/2013)  - appreciate cardio recommendations  - continue to hold lasix  Bladder cancer  - s/p surgery 02/20/13  - Appreciate GU following  Anemia of chronic disease  - Likely secondary to history of malignancy.  - Hemoglobin stable at 9.2, 9.1 - No indications for transfusion  Protein-calorie malnutrition, severe  - Likely secondary to history of bladder cancer  - ontinue Zofran 8 mg IV as needed every 6 hours  - Encourage by mouth intake as patient tolerates  - added megace 200 mg BID to stimulate appetite  S/P AVR /mechanical  - Coumadin per pharmacy; on hold unless no further bleed noted   Code Status: DNR/DNI  Family Communication:  family at the bedside  Disposition Plan: home when stable   Consultants:  Cardiology  Urology  Procedures:  None  Antibiotics:  Doxycycline 03/06/2013 --> plan to continue for 10 days from starting day   [name], MD  [facility] Pager [phone]  If 7PM-7AM, please contact night-coverage [email or web address] Password [email or web address] 03/13/2013, 12:28 PM   LOS: 3 days    HPI/Subjective: Foley out, no acute overnight events. No reports of bleeding this am.  Objective: Filed Vitals:   03/12/13 1449 03/12/13 2111 03/13/13 0427 03/13/13 0553  BP: 131/69 130/74 131/76   Pulse: 94 100 96   Temp: 97.9 F (36.6 C) 98.4 F (36.9 C) 98.1 F (36.7 C)   TempSrc: Oral Oral Oral   Resp: 20 20 20    Height:      Weight:    70.171 kg (154 lb 11.2 oz)  SpO2: 99% 99% 99%     Intake/Output Summary (Last 24 hours) at 03/13/13 1228 Last data filed at 03/13/13 0086  Gross per 24 hour  Intake    240 ml  Output   1000 ml  Net   -760 ml   Exam:  General: Pt is sleeping this am, no acute distress  Cardiovascular: irregular rhythm, rate controlled, S1(+), mechanical S2 Respiratory: Clear to auscultation bilaterally, no wheezing, no crackles, no rhonchi  Abdomen: Soft, non tender, non distended, bowel sounds present, no guarding  Extremities: No edema, pulses DP and PT palpable bilaterally  Neuro: Grossly nonfocal  Data Reviewed: Basic Metabolic Panel:  Recent Labs Lab 03/10/13  1049 03/11/13 0418 03/13/13 0333  NA 141 144 142  K 3.8 3.9 4.5  CL 102 106 108  CO2 28 24 24   GLUCOSE 113* 124* 120*  BUN 70* 66* 54*  CREATININE 4.4* 3.33* 2.57*  CALCIUM 8.3* 8.2* 8.5   Liver Function Tests: No results found for this basename: AST, ALT, ALKPHOS, BILITOT, PROT, ALBUMIN,  in the last 168 hours No results found for this basename: LIPASE, AMYLASE,  in the last 168 hours No results found for this basename: AMMONIA,  in the last 168 hours CBC:  Recent Labs Lab 03/10/13 1048  03/11/13 0418 03/12/13 0315 03/13/13 0333  WBC 13.9* 9.0 8.7 9.9  HGB 10.8* 9.8* 9.2* 9.1*  HCT 34.0* 30.6* 30.0* 29.5*  MCV 91.3 91.6 92.0 92.5  PLT 269.0 214 200 200   Cardiac Enzymes: No results found for this basename: CKTOTAL, CKMB, CKMBINDEX, TROPONINI,  in the last 168 hours BNP: No components found with this basename: POCBNP,  CBG: No results found for this basename: GLUCAP,  in the last 168 hours  No results found for this or any previous visit (from the past 240 hour(s)).   Studies: Dg Abd 2 Views 03/11/2013    IMPRESSION: Bowel gas pattern within normal limits   Electronically Signed   By: Skipper Cliche M.D.   On: 03/11/2013 17:30    Scheduled Meds: . carvedilol  6.25 mg Oral BID WC  . doxycycline  100 mg Oral BID  . feeding supplement (ENSURE COMPLETE)  237 mL Oral BID BM  . feeding supplement (ENSURE)  1 Container Oral TID BM  . finasteride  5 mg Oral q morning - 10a  . megestrol  200 mg Oral BID  . pantoprazole  40 mg Oral Daily  . simvastatin  10 mg Oral QHS  . terazosin  5 mg Oral QHS  . Warfarin - Pharmacist Dosing Inpatient   Does not apply [phone]

## 2013-03-13 NOTE — Progress Notes (Addendum)
ANTICOAGULATION CONSULT NOTE - Follow up  Pharmacy Consult for warfarin Indication: atrial fibrillation, AVR  Allergies  Allergen Reactions  . Ace Inhibitors Other (See Comments)    Dizziness   . Codeine Nausea And Vomiting  . Sulfa Drugs Cross Reactors Nausea And Vomiting    Patient Measurements: Height: 5\' 9"  (175.3 cm) Weight: 154 lb 11.2 oz (70.171 kg) IBW/kg (Calculated) : 70.7   Vital Signs: Temp: 97.4 F (36.3 C) (03/05 1338) Temp src: Oral (03/05 1338) BP: 123/73 mmHg (03/05 1338) Pulse Rate: 109 (03/05 1338)  Labs:  Recent Labs  03/11/13 0418 03/12/13 0315 03/13/13 0333  HGB 9.8* 9.2* 9.1*  HCT 30.6* 30.0* 29.5*  PLT 214 200 200  LABPROT 31.9* 29.8* 27.1*  INR 3.24* 2.96* 2.62*  CREATININE 3.33*  --  2.57*    Estimated Creatinine Clearance: 18.6 ml/min (by C-G formula based on Cr of 2.57).   Assessment: 24 yoM admitted 3/2 from PCP w nausea and abnormal labs. S/p bladder cancer resection in past month. On chronic warfarin PTA for atrial fibrillation and AVR. Pharmacy has been consulted to continue warfarin while inpatient.  Home dose beginning Tues, 03/11/13, take 1/2 tab (2.5mg ) once daily except 1 tab (5mg ) on M/F- previous dosing was 2.5mg  daily except 5mg  MWF  Last dose 3/1  INR 4.1 on 3/2 at outpatient clinic  Patient also on doxycycline outpatient (contined inpatient) which can increase INR, started 2/26, planned for 10 day course  Yesterday, patient noted to be passing blood clots from bladder- [facility] MD d/w Urology MD who suggests holding warfarin. Foley removed this morning, but had to be reinserted due to retention. Per Dr. Charlies Silvers, warfarin should only be resumed if there is no further bleeding. After speaking with RN, patient still passing pink urine with clots. Follow up status tomorrow.  INR today is still therapeutic at 2.62. CBC stable.  Goal of Therapy:  INR 2.5-3.5 Monitor platelets by anticoagulation protocol: Yes   Plan:  - No  warfarin tonight - daily PT/INR - f/u AM CBC - follow up bleeding - pharmacy will follow-up daily  Dicky Doe, PharmD, BCPS Clinical Pharmacist Pager: [phone] 03/13/2013 2:36 PM

## 2013-03-14 DIAGNOSIS — Z7901 Long term (current) use of anticoagulants: Secondary | ICD-10-CM | POA: Diagnosis not present

## 2013-03-14 DIAGNOSIS — C679 Malignant neoplasm of bladder, unspecified: Secondary | ICD-10-CM | POA: Diagnosis not present

## 2013-03-14 DIAGNOSIS — R319 Hematuria, unspecified: Secondary | ICD-10-CM | POA: Diagnosis not present

## 2013-03-14 DIAGNOSIS — D638 Anemia in other chronic diseases classified elsewhere: Secondary | ICD-10-CM | POA: Diagnosis not present

## 2013-03-14 DIAGNOSIS — R339 Retention of urine, unspecified: Secondary | ICD-10-CM | POA: Diagnosis not present

## 2013-03-14 DIAGNOSIS — Z954 Presence of other heart-valve replacement: Secondary | ICD-10-CM | POA: Diagnosis not present

## 2013-03-14 DIAGNOSIS — R944 Abnormal results of kidney function studies: Secondary | ICD-10-CM | POA: Diagnosis not present

## 2013-03-14 DIAGNOSIS — I4891 Unspecified atrial fibrillation: Secondary | ICD-10-CM | POA: Diagnosis not present

## 2013-03-14 DIAGNOSIS — N179 Acute kidney failure, unspecified: Secondary | ICD-10-CM | POA: Diagnosis not present

## 2013-03-14 LAB — PROTIME-INR
INR: 2.12 — ABNORMAL HIGH (ref 0.00–1.49)
Prothrombin Time: 23.1 seconds — ABNORMAL HIGH (ref 11.6–15.2)

## 2013-03-14 LAB — CBC
HCT: 29.6 % — ABNORMAL LOW (ref 39.0–52.0)
Hemoglobin: 9.3 g/dL — ABNORMAL LOW (ref 13.0–17.0)
MCH: 29 pg (ref 26.0–34.0)
MCHC: 31.4 g/dL (ref 30.0–36.0)
MCV: 92.2 fL (ref 78.0–100.0)
Platelets: 205 10*3/uL (ref 150–400)
RBC: 3.21 MIL/uL — AB (ref 4.22–5.81)
RDW: 15 % (ref 11.5–15.5)
WBC: 8.6 10*3/uL (ref 4.0–10.5)

## 2013-03-14 LAB — BASIC METABOLIC PANEL
BUN: 42 mg/dL — ABNORMAL HIGH (ref 6–23)
CO2: 25 meq/L (ref 19–32)
Calcium: 8.5 mg/dL (ref 8.4–10.5)
Chloride: 105 mEq/L (ref 96–112)
Creatinine, Ser: 1.96 mg/dL — ABNORMAL HIGH (ref 0.50–1.35)
GFR calc Af Amer: 33 mL/min — ABNORMAL LOW (ref >90)
GFR calc non Af Amer: 28 mL/min — ABNORMAL LOW (ref >90)
Glucose, Bld: 119 mg/dL — ABNORMAL HIGH (ref 70–99)
POTASSIUM: 4 meq/L (ref 3.7–5.3)
Sodium: 142 mEq/L (ref 137–147)

## 2013-03-14 MED ORDER — WARFARIN SODIUM 3 MG PO TABS
3.0000 mg | ORAL_TABLET | Freq: Once | ORAL | Status: AC
  Administered 2013-03-14: 3 mg via ORAL
  Filled 2013-03-14: qty 1

## 2013-03-14 NOTE — Progress Notes (Signed)
Physical Therapy Treatment Patient Details Name: Eric Wheeler MRN: [id number] DOB: 1919/11/26 Today's Date: 03/14/2013 Time: 1224-8250 PT Time Calculation (min): 13 min  PT Assessment / Plan / Recommendation  History of Present Illness 80 year old male with past medical history of atrial fibrillation on Coumadin, CHF, St. Jude's AVR,  bladder cancer and recent resection in 02/2013 (by Dr. [name]), has indwelling foley catheter who presented to [facility] ED 03/10/2013 with poor oral intake, nausea and vomiting ever since his surgery. In ED, his vitals were stable but blood work revealed acute elevation in creatinine from baseline on 1.6 to now 4.4.  Admitted for acute renal failure   PT Comments   Assited pt OOB to amb in hallway using his SPC.                             Follow Up Recommendations  Home health PT     Does the patient have the potential to tolerate intense rehabilitation     Barriers to Discharge        Equipment Recommendations  None recommended by PT    Recommendations for Other Services    Frequency Min 3X/week   Progress towards PT Goals Progress towards PT goals: Progressing toward goals  Plan      Precautions / Restrictions Precautions Precautions: Fall Restrictions Weight Bearing Restrictions: No   Pertinent Vitals/Pain     Mobility  Bed Mobility Overal bed mobility: Modified Independent General bed mobility comments: increased time Transfers Overall transfer level: Needs assistance Equipment used: Straight cane Transfers: Sit to/from Stand Sit to Stand: Min guard;Min assist General transfer comment: amb with pt's personal single point cane this session.  Noted increased balance deficit and pt had x 2 staggered LOB.  Pt would be safer with RW but not sure he would comply.  Ambulation/Gait Ambulation/Gait assistance: Min assist;Min guard Ambulation Distance (Feet): 135 Feet Assistive device: Straight cane Gait Pattern/deviations: Step-through  pattern;Trunk flexed Gait velocity: decreased General Gait Details: Pt requested to amb with his cane vs RW.  Demon increased LOS and unsteadyness.      PT Goals (current goals can now be found in the care plan section)    Visit Information  Last PT Received On: 03/14/13 Assistance Needed: +1 History of Present Illness: 80 year old male with past medical history of atrial fibrillation on Coumadin, CHF, St. Jude's AVR,  bladder cancer and recent resection in 02/2013 (by Dr. [name]), has indwelling foley catheter who presented to [facility] ED 03/10/2013 with poor oral intake, nausea and vomiting ever since his surgery. In ED, his vitals were stable but blood work revealed acute elevation in creatinine from baseline on 1.6 to now 4.4.  Admitted for acute renal failure    Subjective Data      Cognition       Balance     End of Session PT - End of Session Equipment Utilized During Treatment: Gait belt Activity Tolerance: Patient tolerated treatment well Patient left: in bed;with call bell/phone within reach;with family/visitor present   Rica Koyanagi  PTA [facility]  Acute  Rehab Pager      [phone]

## 2013-03-14 NOTE — Care Management Note (Addendum)
Cm spoke with patient, spouse, and son-n-law at the bedside concerning discharge planning. Pt eval recommends HHPT. Md recommends [facility] for PT/INR draws. Cm provided pt with choice list for [facility]. Per daughter's choice [facility] to provide [facility] services. [facility] rep Katie notified. No other needs identified.    Venita Lick Gurpreet Mikhail,MSN,RN [phone]

## 2013-03-14 NOTE — Progress Notes (Addendum)
ANTICOAGULATION CONSULT NOTE - Follow up  Pharmacy Consult for warfarin Indication: atrial fibrillation, AVR  Allergies  Allergen Reactions  . Ace Inhibitors Other (See Comments)    Dizziness   . Codeine Nausea And Vomiting  . Sulfa Drugs Cross Reactors Nausea And Vomiting    Patient Measurements: Height: 5\' 9"  (175.3 cm) Weight: 154 lb 3.2 oz (69.945 kg) IBW/kg (Calculated) : 70.7   Vital Signs: Temp: 97.2 F (36.2 C) (03/06 0513) Temp src: Oral (03/06 0513) BP: 123/71 mmHg (03/06 0513) Pulse Rate: 94 (03/06 0513)  Labs:  Recent Labs  03/12/13 0315 03/13/13 0333 03/14/13 0318  HGB 9.2* 9.1* 9.3*  HCT 30.0* 29.5* 29.6*  PLT 200 200 205  LABPROT 29.8* 27.1* 23.1*  INR 2.96* 2.62* 2.12*  CREATININE  --  2.57* 1.96*    Estimated Creatinine Clearance: 24.3 ml/min (by C-G formula based on Cr of 1.96).   Assessment: 18 yoM admitted 3/2 from PCP w nausea and abnormal labs. S/p bladder cancer resection in past month. On chronic warfarin PTA for atrial fibrillation and AVR. Pharmacy has been consulted to continue warfarin while inpatient.  Home dose beginning Tues, 03/11/13, take 1/2 tab (2.5mg ) once daily except 1 tab (5mg ) on M/F- previous dosing was 2.5mg  daily except 5mg  MWF  Last dose 3/1  INR 4.1 on 3/2 at outpatient clinic  Patient also on doxycycline outpatient (contined inpatient) which can increase INR, started 2/26, planned for 10 day course  On 3/4 patient noted to be passing blood clots from bladder- TRH MD d/w Urology MD who suggests holding warfarin. Foley removed this morning, but had to be reinserted due to retention. Per Dr. Charlies Silvers, warfarin should only be resumed if there is no further bleeding. On 3/5 patient was still passing pink urine with clots so warfarin was held.  CBC today stable/okay with Hgb 9.3 and Plt 205    INR today is trending down with holding warfarin but remains therapeutic at 2.12  Spoke with MD and RN this morning on rounds,  no further bleeding noted and okay to give warfarin dose tonight.   Goal of Therapy:  INR 2.5-3.5 Monitor platelets by anticoagulation protocol: Yes   Plan: - - Per home regimen patient would receive 5 mg today, given recent bleeding/clotting in urine will give 3 mg tonight as conservative dose.   INR may continue to trend down slightly but do not want patient to further bleeding.  - daily PT/INR - f/u AM CBC - follow up bleeding - pharmacy will follow-up daily  Drewey Begue, Gaye Alken PharmD Pager #: [phone] 8:25 AM 03/14/2013

## 2013-03-14 NOTE — Progress Notes (Addendum)
[facility] PROGRESS NOTE  Eric Wheeler BJY:[id number] DOB: [birth year] DOA: 03/10/2013 PCP: Eric Merle, NP  Brief narrative: 80 year old male with past medical history of atrial fibrillation on Coumadin, CHF, St. Jude's AVR, bladder cancer and recent resection in 02/2013 (by Dr. [name]), has indwelling foley catheter who presented to [facility] ED 03/10/2013 with poor oral intake, nausea and vomiting ever since his surgery. In ED, his vitals were stable but blood work revealed acute elevation in creatinine from baseline on 1.6 to now 4.4. Hospital course slightly complicated due to episode of hematuria and urinary retention. Per GU, pt needs foley cath and will need it on discharge as well.   Assessment/Plan:   Principal Problem:  Acute renal failure  - Likely secondary to Lasix which is now on hold. Possibility also includes urinary tract infection. Patient's urinalysis was significant for moderate leukocytes. Urine culture was not obtained at the time of the admission  - creatinine trended down to 1.96 this am - on doxycycline 100 mg PO BID started 03/06/2013 which will be continued for total of 10 days  - By mouth intake improved now that we started megace  Active Problems:  Chronic atrial fibrillation  - On anticoagulation with Coumadin; this was put on hold due to hematuria. Urine cleared up so will restart it at today Chronic diastolic congestive heart failure  - last 2 D ECHO showed EF 45-50% (done 03/03/2013)  - appreciate cardio recommendations  - continue to hold lasix  Bladder cancer  - s/p surgery 02/20/13  - Appreciate GU following  Anemia of chronic disease  - Likely secondary to history of malignancy.  - Hemoglobin stable at 9.3 - No indications for transfusion  Protein-calorie malnutrition, severe  - Likely secondary to history of bladder cancer  - ontinue Zofran 8 mg IV as needed every 6 hours  - Encourage by mouth intake as patient tolerates  - added megace 200 mg  BID to stimulate appetite  S/P AVR /mechanical  - Coumadin per pharmacy; on hold unless no further bleed noted   Code Status: DNR/DNI  Family Communication: family at the bedside  Disposition Plan: home when stable; likely in next 24 hours  Consultants:  Cardiology  Urology  Procedures:  None  Antibiotics:  Doxycycline 03/06/2013 --> plan to continue for 10 days from starting day   [name], MD  [facility] Pager [phone]  If 7PM-7AM, please contact night-coverage [email or web address] Password [email or web address] 03/14/2013, 11:27 AM   LOS: 4 days    HPI/Subjective: No acute overnight events.  Objective: Filed Vitals:   03/13/13 1338 03/13/13 2047 03/14/13 0513 03/14/13 0605  BP: 123/73 124/62 123/71   Pulse: 109 90 94   Temp: 97.4 F (36.3 C) 98.5 F (36.9 C) 97.2 F (36.2 C)   TempSrc: Oral Oral Oral   Resp: 18 20 20    Height:      Weight:    69.945 kg (154 lb 3.2 oz)  SpO2: 100% 99% 99%     Intake/Output Summary (Last 24 hours) at 03/14/13 1127 Last data filed at 03/14/13 1007  Gross per 24 hour  Intake    820 ml  Output   2850 ml  Net  -2030 ml   Exam:  General: Pt is sleeping this am, no acute distress  Cardiovascular: irregular rhythm, rate controlled, S1(+), mechanical S2 Respiratory: Clear to auscultation bilaterally, no wheezing, no crackles, no rhonchi  Abdomen: Soft, non tender, non distended, bowel sounds present, no guarding  Extremities:  No edema, pulses DP and PT palpable bilaterally  Neuro: Grossly nonfocal   Data Reviewed: Basic Metabolic Panel:  Recent Labs Lab 03/10/13 1049 03/11/13 0418 03/13/13 0333 03/14/13 0318  NA 141 144 142 142  K 3.8 3.9 4.5 4.0  CL 102 106 108 105  CO2 28 24 24 25   GLUCOSE 113* 124* 120* 119*  BUN 70* 66* 54* 42*  CREATININE 4.4* 3.33* 2.57* 1.96*  CALCIUM 8.3* 8.2* 8.5 8.5   Liver Function Tests: No results found for this basename: AST, ALT, ALKPHOS, BILITOT, PROT, ALBUMIN,  in the last 168 hours No  results found for this basename: LIPASE, AMYLASE,  in the last 168 hours No results found for this basename: AMMONIA,  in the last 168 hours CBC:  Recent Labs Lab 03/10/13 1048 03/11/13 0418 03/12/13 0315 03/13/13 0333 03/14/13 0318  WBC 13.9* 9.0 8.7 9.9 8.6  HGB 10.8* 9.8* 9.2* 9.1* 9.3*  HCT 34.0* 30.6* 30.0* 29.5* 29.6*  MCV 91.3 91.6 92.0 92.5 92.2  PLT 269.0 214 200 200 205   Cardiac Enzymes: No results found for this basename: CKTOTAL, CKMB, CKMBINDEX, TROPONINI,  in the last 168 hours BNP: No components found with this basename: POCBNP,  CBG: No results found for this basename: GLUCAP,  in the last 168 hours  No results found for this or any previous visit (from the past 240 hour(s)).   Studies: No results found.  Scheduled Meds: . carvedilol  6.25 mg Oral BID WC  . doxycycline  100 mg Oral BID  . feeding supplement (ENSURE COMPLETE)  237 mL Oral BID BM  . feeding supplement (ENSURE)  1 Container Oral TID BM  . finasteride  5 mg Oral q morning - 10a  . megestrol  200 mg Oral BID  . pantoprazole  40 mg Oral Daily  . simvastatin  10 mg Oral QHS  . terazosin  5 mg Oral QHS  . warfarin  3 mg Oral ONCE-1800

## 2013-03-14 NOTE — Progress Notes (Signed)
       Patient Name: Eric Wheeler Date of Encounter: 03/14/2013    SUBJECTIVE: He feels better and appetite is improved.  TELEMETRY: N/A Filed Vitals:   03/13/13 1338 03/13/13 2047 03/14/13 0513 03/14/13 0605  BP: 123/73 124/62 123/71   Pulse: 109 90 94   Temp: 97.4 F (36.3 C) 98.5 F (36.9 C) 97.2 F (36.2 C)   TempSrc: Oral Oral Oral   Resp: 18 20 20    Height:      Weight:    154 lb 3.2 oz (69.945 kg)  SpO2: 100% 99% 99%     Intake/Output Summary (Last 24 hours) at 03/14/13 0850 Last data filed at 03/14/13 0515  Gross per 24 hour  Intake    700 ml  Output   2450 ml  Net  -1750 ml   I/O negative 2155 cc since admission  LABS: Basic Metabolic Panel:  Recent Labs  03/13/13 0333 03/14/13 0318  NA 142 142  K 4.5 4.0  CL 108 105  CO2 24 25  GLUCOSE 120* 119*  BUN 54* 42*  CREATININE 2.57* 1.96*  CALCIUM 8.5 8.5   CBC:  Recent Labs  03/13/13 0333 03/14/13 0318  WBC 9.9 8.6  HGB 9.1* 9.3*  HCT 29.5* 29.6*  MCV 92.5 92.2  PLT 200 205    Radiology/Studies:  No new data  Physical Exam: Blood pressure 123/71, pulse 94, temperature 97.2 F (36.2 C), temperature source Oral, resp. rate 20, height 5\' 9"  (1.753 m), weight 154 lb 3.2 oz (69.945 kg), SpO2 99.00%. Weight change: -8 oz (-0.227 kg)   Neck veins still flat.  ASSESSMENT:  1. Renal impairment is slowly improving as intake increases 2. Mechanical valve, Aortic St. Jude 3. No recurrence of hematuria 4. Chronic anticoagulation, INR 2.12 today  Plan:  1. Continue to hold diuretic as renal function improves. Can probably resume home diuretic and potassium home dose tomorrow or Sunday. 2. If no bleeding, resume anticoagulation   Signed, Sinclair Grooms 03/14/2013, 8:50 AM

## 2013-03-14 NOTE — Care Management Note (Signed)
Attending MD and RN notified pt no longer meeting INPT criteria.    Venita Lick Karalina Tift,MSN,RN [phone]

## 2013-03-14 NOTE — Progress Notes (Signed)
Patient ID: Eric Wheeler, male   DOB: Oct 04, 1919, 80 y.o.   MRN: [id number]    Subjective: [name] failed his voiding trial.  A foley was replaced and is draining clear urine.  His renal function continues to improve and he is feeling better.   ROS: Negative except his appetite is improving.   Objective: Vital signs in last 24 hours: Temp:  [97.2 F (36.2 C)-98.5 F (36.9 C)] 97.2 F (36.2 C) (03/06 0513) Pulse Rate:  [90-109] 94 (03/06 0513) Resp:  [18-20] 20 (03/06 0513) BP: (123-124)/(62-73) 123/71 mmHg (03/06 0513) SpO2:  [99 %-100 %] 99 % (03/06 0513) Weight:  [69.945 kg (154 lb 3.2 oz)] 69.945 kg (154 lb 3.2 oz) (03/06 0605)  Intake/Output from previous day: 03/05 0701 - 03/06 0700 In: 940 [P.O.:940] Out: 2450 [Urine:2450] Intake/Output this shift:    General appearance: alert and no distress Urine clear.  Lab Results:   Recent Labs  03/13/13 0333 03/14/13 0318  WBC 9.9 8.6  HGB 9.1* 9.3*  HCT 29.5* 29.6*  PLT 200 205   BMET  Recent Labs  03/13/13 0333 03/14/13 0318  NA 142 142  K 4.5 4.0  CL 108 105  CO2 24 25  GLUCOSE 120* 119*  BUN 54* 42*  CREATININE 2.57* 1.96*  CALCIUM 8.5 8.5   PT/INR  Recent Labs  03/13/13 0333 03/14/13 0318  LABPROT 27.1* 23.1*  INR 2.62* 2.12*   ABG No results found for this basename: PHART, PCO2, PO2, HCO3,  in the last 72 hours  Studies/Results: No results found.  Anti-infectives: Anti-infectives   Start     Dose/Rate Route Frequency Ordered Stop   03/10/13 2330  doxycycline (VIBRA-TABS) tablet 100 mg     100 mg Oral 2 times daily 03/10/13 2301        Current Facility-Administered Medications  Medication Dose Route Frequency Provider Last Rate Last Dose  . alum & mag hydroxide-simeth (MAALOX/MYLANTA) 200-200-20 MG/5ML suspension 30 mL  30 mL Oral Q6H PRN Phillips Grout, MD      . carvedilol (COREG) tablet 6.25 mg  6.25 mg Oral BID WC Phillips Grout, MD   6.25 mg at 03/13/13 1749  . docusate  sodium (COLACE) capsule 100 mg  100 mg Oral BID PRN Robbie Lis, MD   100 mg at 03/13/13 2116  . doxycycline (VIBRA-TABS) tablet 100 mg  100 mg Oral BID Phillips Grout, MD   100 mg at 03/13/13 2116  . feeding supplement (ENSURE COMPLETE) (ENSURE COMPLETE) liquid 237 mL  237 mL Oral BID BM Hazle Coca, RD   237 mL at 03/12/13 1404  . feeding supplement (ENSURE) (ENSURE) pudding 1 Container  1 Container Oral TID BM Phillips Grout, MD   1 Container at 03/13/13 1400  . finasteride (PROSCAR) tablet 5 mg  5 mg Oral q morning - 10a Phillips Grout, MD   5 mg at 03/13/13 0949  . megestrol (MEGACE) 400 MG/10ML suspension 200 mg  200 mg Oral BID Robbie Lis, MD   200 mg at 03/13/13 2115  . morphine 2 MG/ML injection 0.5 mg  0.5 mg Intravenous Q4H PRN Robbie Lis, MD   0.5 mg at 03/11/13 1547  . ondansetron (ZOFRAN) 8 mg/NS 50 ml IVPB  8 mg Intravenous Q6H PRN Robbie Lis, MD      . pantoprazole (PROTONIX) EC tablet 40 mg  40 mg Oral Daily Phillips Grout, MD   40 mg at  03/13/13 0949  . simvastatin (ZOCOR) tablet 10 mg  10 mg Oral QHS Phillips Grout, MD   10 mg at 03/13/13 2115  . terazosin (HYTRIN) capsule 5 mg  5 mg Oral QHS Phillips Grout, MD   5 mg at 03/13/13 2115  . traMADol (ULTRAM) tablet 50 mg  50 mg Oral Q6H PRN Phillips Grout, MD      . Warfarin - Pharmacist Dosing Inpatient   Does not apply q1800 Mosetta Pigeon, [facility]        Assessment: S/P TURBT with retention and multiple readmissions for ARI now improving but requiring foley drainage.   Plan: He will need to be discharged with the foley and we can consider a repeat voiding trial in about 2-3 weeks.   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      LOS: 4 days    Talena Neira J 03/14/2013

## 2013-03-15 DIAGNOSIS — N189 Chronic kidney disease, unspecified: Secondary | ICD-10-CM | POA: Diagnosis not present

## 2013-03-15 DIAGNOSIS — Z954 Presence of other heart-valve replacement: Secondary | ICD-10-CM | POA: Diagnosis not present

## 2013-03-15 DIAGNOSIS — N179 Acute kidney failure, unspecified: Secondary | ICD-10-CM | POA: Diagnosis not present

## 2013-03-15 DIAGNOSIS — C679 Malignant neoplasm of bladder, unspecified: Secondary | ICD-10-CM | POA: Diagnosis not present

## 2013-03-15 DIAGNOSIS — Z7901 Long term (current) use of anticoagulants: Secondary | ICD-10-CM | POA: Diagnosis not present

## 2013-03-15 DIAGNOSIS — D638 Anemia in other chronic diseases classified elsewhere: Secondary | ICD-10-CM | POA: Diagnosis not present

## 2013-03-15 DIAGNOSIS — I4891 Unspecified atrial fibrillation: Secondary | ICD-10-CM | POA: Diagnosis not present

## 2013-03-15 LAB — CBC
HCT: 30.1 % — ABNORMAL LOW (ref 39.0–52.0)
Hemoglobin: 9.3 g/dL — ABNORMAL LOW (ref 13.0–17.0)
MCH: 28.4 pg (ref 26.0–34.0)
MCHC: 30.9 g/dL (ref 30.0–36.0)
MCV: 92 fL (ref 78.0–100.0)
Platelets: 187 K/uL (ref 150–400)
RBC: 3.27 MIL/uL — ABNORMAL LOW (ref 4.22–5.81)
RDW: 14.8 % (ref 11.5–15.5)
WBC: 8.2 K/uL (ref 4.0–10.5)

## 2013-03-15 LAB — BASIC METABOLIC PANEL
BUN: 37 mg/dL — ABNORMAL HIGH (ref 6–23)
CO2: 28 meq/L (ref 19–32)
Calcium: 8.5 mg/dL (ref 8.4–10.5)
Chloride: 106 mEq/L (ref 96–112)
Creatinine, Ser: 1.62 mg/dL — ABNORMAL HIGH (ref 0.50–1.35)
GFR calc Af Amer: 41 mL/min — ABNORMAL LOW (ref >90)
GFR calc non Af Amer: 35 mL/min — ABNORMAL LOW (ref >90)
GLUCOSE: 112 mg/dL — AB (ref 70–99)
POTASSIUM: 3.9 meq/L (ref 3.7–5.3)
SODIUM: 142 meq/L (ref 137–147)

## 2013-03-15 LAB — PROTIME-INR
INR: 1.98 — ABNORMAL HIGH (ref 0.00–1.49)
Prothrombin Time: 21.9 s — ABNORMAL HIGH (ref 11.6–15.2)

## 2013-03-15 MED ORDER — ENSURE COMPLETE PO LIQD: 237.0000 mL | Freq: Two times a day (BID) | ORAL | Status: AC

## 2013-03-15 MED ORDER — FUROSEMIDE 40 MG PO TABS: 20.0000 mg | ORAL_TABLET | Freq: Every day | ORAL | Status: DC

## 2013-03-15 MED ORDER — MEGESTROL ACETATE 400 MG/10ML PO SUSP: 200.0000 mg | Freq: Two times a day (BID) | ORAL | Status: DC

## 2013-03-15 MED ORDER — DSS 100 MG PO CAPS: 100.0000 mg | ORAL_CAPSULE | Freq: Two times a day (BID) | ORAL | Status: DC | PRN

## 2013-03-15 MED ORDER — PANTOPRAZOLE SODIUM 40 MG PO TBEC: 40.0000 mg | DELAYED_RELEASE_TABLET | Freq: Every day | ORAL | Status: DC

## 2013-03-15 MED ORDER — POTASSIUM CHLORIDE 20 MEQ PO PACK: 10.0000 meq | PACK | Freq: Every day | ORAL | Status: DC

## 2013-03-15 MED ORDER — WARFARIN SODIUM 5 MG PO TABS
5.0000 mg | ORAL_TABLET | Freq: Once | ORAL | Status: DC
  Filled 2013-03-15: qty 1

## 2013-03-15 MED ORDER — ENSURE PUDDING PO PUDG: 1.0000 | Freq: Three times a day (TID) | ORAL | Status: DC

## 2013-03-15 NOTE — Progress Notes (Signed)
Subjective:No CP   Breathing good  Appetite improved.   Objective: Filed Vitals:   03/14/13 0605 03/14/13 1435 03/14/13 2058 03/15/13 0451  BP:  104/55 112/67 104/60  Pulse:  96 79 85  Temp:  97.8 F (36.6 C) 99.1 F (37.3 C) 97.5 F (36.4 C)  TempSrc:  Oral Oral Oral  Resp:  16 18 18   Height:      Weight: 154 lb 3.2 oz (69.945 kg)   153 lb 11.2 oz (69.718 kg)  SpO2:  99% 100% 99%   Weight change: -8 oz (-0.227 kg)  Intake/Output Summary (Last 24 hours) at 03/15/13 1005 Last data filed at 03/15/13 0453  Gross per 24 hour  Intake    920 ml  Output   1700 ml  Net   -780 ml    General: Alert, awake, oriented x3, in no acute distress Neck:  JVP is normal Heart: Regular rate and rhythm, without murmurs, rubs, gallops.  Lungs: Clear to auscultation.  No rales or wheezes. Exemities:  No edema.   Neuro: Grossly intact, nonfocal.   Lab Results: Results for orders placed during the hospital encounter of 03/10/13 (from the past 24 hour(s))  PROTIME-INR     Status: Abnormal   Collection Time    03/15/13  3:49 AM      Result Value Ref Range   Prothrombin Time 21.9 (*) 11.6 - 15.2 seconds   INR 1.98 (*) 0.00 - 3.82  BASIC METABOLIC PANEL     Status: Abnormal   Collection Time    03/15/13  3:49 AM      Result Value Ref Range   Sodium 142  137 - 147 mEq/L   Potassium 3.9  3.7 - 5.3 mEq/L   Chloride 106  96 - 112 mEq/L   CO2 28  19 - 32 mEq/L   Glucose, Bld 112 (*) 70 - 99 mg/dL   BUN 37 (*) 6 - 23 mg/dL   Creatinine, Ser 1.62 (*) 0.50 - 1.35 mg/dL   Calcium 8.5  8.4 - 10.5 mg/dL   GFR calc non Af Amer 35 (*) >90 mL/min   GFR calc Af Amer 41 (*) >90 mL/min  CBC     Status: Abnormal   Collection Time    03/15/13  3:49 AM      Result Value Ref Range   WBC 8.2  4.0 - 10.5 K/uL   RBC 3.27 (*) 4.22 - 5.81 MIL/uL   Hemoglobin 9.3 (*) 13.0 - 17.0 g/dL   HCT 30.1 (*) 39.0 - 52.0 %   MCV 92.0  78.0 - 100.0 fL   MCH 28.4  26.0 - 34.0 pg   MCHC 30.9  30.0 - 36.0 g/dL   RDW 14.8   11.5 - 15.5 %   Platelets 187  150 - 400 K/uL    Studies/Results: @RISRSLT24 @  Medications: REviewed   @PROBHOSP @  1.  AVR  WOuld resume coumadin as hematuria resolved  2.  REnall  Cr improving  Discussing with daughter patient was taking at least 1 lasix a day Would Rec Lasix with KCL if wt goes up  Patient has done this in the past  Patient has appt with L Gerhardt on Friday  I will check on moving to earlier in week.    LOS: 5 days   Dorris Carnes 03/15/2013, 10:05 AM

## 2013-03-15 NOTE — Discharge Summary (Signed)
Physician Discharge Summary  Eric Wheeler [id number] DOB: 11-14-1919 DOA: 03/10/2013  PCP: [name], NP  Admit date: 03/10/2013 Discharge date: 03/15/2013  Recommendations for Outpatient Follow-up:  1. Please note the following changes in medication: you may stop taking doxycycline. Original end date was for 3/7 which is today so you may stop taking it on discharge. 2. In regards to coumadin take 5 mg today and tomorrow night and then resume home med regimen with INR to be rechecked possibly Monday or Tuesday 3/9 or 3/10. If you notice a bleed bleed check with PCP if it is ok to continue coumadin or you ay be required to hold it temporarily. 3. Lasix is now 20 mg daily but may increase to 40 mg daily if weight increases more than 2 pounds over period of 24-48 hours. Add potassium 10 meq daily. 4. Continue foley until seen by urology in 2 weeks from discharge.  Discharge Diagnoses:  Principal Problem:   Acute renal failure Active Problems:   Chronic atrial fibrillation   Chronic diastolic congestive heart failure- last EF 45-50%   S/P AVR (aortic valve replacement)   Bladder cancer- s/p surgery 02/20/13   UTI (urinary tract infection)   Anemia of chronic disease   Chronic anticoagulation   FTT (failure to thrive) in adult   Protein-calorie malnutrition, severe    Discharge Condition: medically stable for discharge home today with HH orders in place  Diet recommendation: as tolerated   History of present illness:  80 year old male with past medical history of atrial fibrillation on Coumadin, CHF, [employer] AVR, bladder cancer and recent resection in 02/2013 (by Dr. [name]), has indwelling foley catheter who presented to [facility] ED 03/10/2013 with poor oral intake, nausea and vomiting ever since his surgery. In ED, his vitals were stable but blood work revealed acute elevation in creatinine from baseline on 1.6 to now 4.4. Hospital course slightly complicated due to episode of  hematuria and urinary retention. Per GU, pt needs foley cath and will need it on discharge as well.   Assessment/Plan:   Principal Problem:  Acute renal failure  - Likely secondary to Lasix which was on hold during the hospital stay but low dose lasix will be started on discharge. Possibility for ARF also includes urinary tract infection. Patient's urinalysis was significant for moderate leukocytes. Urine culture was not obtained at the time of the admission  - creatinine trended down to 1.96 - on doxycycline 100 mg PO BID started 03/06/2013 which was supposed to end 03/15/2013 per pharmacy recommendations  - By mouth intake improved now that we started megace  Active Problems:  Chronic atrial fibrillation  - On anticoagulation with Coumadin; this was put on hold due to hematuria. Urine cleared up so we restarted it and no further bleed noted - please see above in follow up section recommendations for coumadin dosage as recommended by pharmacy  Chronic diastolic congestive heart failure  - last 2 D ECHO showed EF 45-50% (done 03/03/2013)  - appreciate cardio recommendations  - lasix 20 mg daily on discharge  Bladder cancer  - s/p surgery 02/20/13  - Appreciate GU following  Anemia of chronic disease  - Likely secondary to history of malignancy.  - Hemoglobin stable at 9.3  - No indications for transfusion  Protein-calorie malnutrition, severe  - Likely secondary to history of bladder cancer  - ontinue Zofran 8 mg IV as needed every 6 hours  - Encourage by mouth intake as patient tolerates  -  added megace 200 mg BID to stimulate appetite  S/P AVR /mechanical  - Coumadin per pharmacy; on hold unless no further bleed noted   Code Status: DNR/DNI  Family Communication: family at the bedside   Consultants:  Cardiology  Urology  Procedures:  None  Antibiotics:  Doxycycline 03/06/2013 --> 03/15/2013  Signed:  [name], MD  [facility] 03/15/2013, 1:44 PM  Pager #:  [phone]  Discharge Exam: Filed Vitals:   03/15/13 0451  BP: 104/60  Pulse: 85  Temp: 97.5 F (36.4 C)  Resp: 18   Filed Vitals:   03/14/13 0605 03/14/13 1435 03/14/13 2058 03/15/13 0451  BP:  104/55 112/67 104/60  Pulse:  96 79 85  Temp:  97.8 F (36.6 C) 99.1 F (37.3 C) 97.5 F (36.4 C)  TempSrc:  Oral Oral Oral  Resp:  16 18 18   Height:      Weight: 69.945 kg (154 lb 3.2 oz)   69.718 kg (153 lb 11.2 oz)  SpO2:  99% 100% 99%    General: Pt is alert, follows commands appropriately, not in acute distress Cardiovascular: irregular rhythm, S1/S2 appreciated  Respiratory: Clear to auscultation bilaterally, no wheezing, no crackles, no rhonchi Abdominal: Soft, non tender, non distended, bowel sounds +, no guarding; has foley catheter  Extremities: no cyanosis, pulses palpable bilaterally DP and PT Neuro: Grossly nonfocal  Discharge Instructions  Discharge Orders   Future Appointments Provider Department Dept Phone   03/21/2013 11:30 AM [facility] [place] [facility] [phone]   03/21/2013 11:45 AM Burtis Junes, NP [place] Office [phone]   Future Orders Complete By Expires   Call MD for:  difficulty breathing, headache or visual disturbances  As directed    Call MD for:  persistant dizziness or light-headedness  As directed    Call MD for:  persistant nausea and vomiting  As directed    Call MD for:  severe uncontrolled pain  As directed    Diet - low sodium heart healthy  As directed    Discharge instructions  As directed    Comments:     1. Please note the following changes in medication: you may stop taking doxycycline. Original end date was for 3/7 which is today so you may stop taking it on discharge. 2. In regards to coumadin take 5 mg today and tomorrow night and then resume home med regimen with INR to be rechecked possibly Monday or Tuesday 3/9 or 3/10. If you notice a bleed bleed check with PCP if it is ok  to continue coumadin or you ay be required to hold it temporarily. 3. Lasix is now 20 mg daily but may increase to 40 mg daily if weight increases more than 2 pounds over period of 24-48 hours. Add potassium 10 meq daily. 4. Continue foley until seen by urology in 2 weeks from discharge.   Increase activity slowly  As directed        Medication List    STOP taking these medications       doxycycline 100 MG capsule  Commonly known as:  VIBRAMYCIN      TAKE these medications       carvedilol 6.25 MG tablet  Commonly known as:  COREG  Take 6.25 mg by mouth 2 (two) times daily with a meal.     DSS 100 MG Caps  Take 100 mg by mouth 2 (two) times daily as needed for mild constipation.  feeding supplement (ENSURE COMPLETE) Liqd  Take 237 mLs by mouth 2 (two) times daily between meals.     feeding supplement (ENSURE) Pudg  Take 1 Container by mouth 3 (three) times daily between meals.     finasteride 5 MG tablet  Commonly known as:  PROSCAR  Take 5 mg by mouth every morning.     furosemide 40 MG tablet  Commonly known as:  LASIX  Take 0.5 tablets (20 mg total) by mouth daily. Take 1 tablet once daily as needed, may take an additional tablet in the afternoon if weight is over 2lbs.     megestrol 400 MG/10ML suspension  Commonly known as:  MEGACE  Take 5 mLs (200 mg total) by mouth 2 (two) times daily.     ondansetron 4 MG disintegrating tablet  Commonly known as:  ZOFRAN-ODT  Take 4 mg by mouth every 6 (six) hours as needed for nausea or vomiting.     pantoprazole 40 MG tablet  Commonly known as:  PROTONIX  Take 1 tablet (40 mg total) by mouth daily.     phenazopyridine 100 MG tablet  Commonly known as:  PYRIDIUM  Take 100 mg by mouth 3 (three) times daily as needed for pain.     potassium chloride 20 MEQ packet  Commonly known as:  KLOR-CON  Take 10 mEq by mouth daily.     simvastatin 10 MG tablet  Commonly known as:  ZOCOR  Take 10 mg by mouth at bedtime.      terazosin 5 MG capsule  Commonly known as:  HYTRIN  Take 5 mg by mouth at bedtime.     traMADol 50 MG tablet  Commonly known as:  ULTRAM  Take 50 mg by mouth every 6 (six) hours as needed for moderate pain.     warfarin 5 MG tablet  Commonly known as:  COUMADIN  Beginning Tues, 03/11/13, take 1/2 tab (2.5mg ) once daily except every 4th day take 1 tab (5mg ) once daily.           Follow-up Information   Follow up with [name], NP. Schedule an appointment as soon as possible for a visit on 03/17/2013. (need INR check)    Specialty:  Nurse Practitioner   Contact information:   [address] [place] [address] [phone]        The results of significant diagnostics from this hospitalization (including imaging, microbiology, ancillary and laboratory) are listed below for reference.    Significant Diagnostic Studies: Ct Abdomen Pelvis Wo Contrast  02/27/2013   CLINICAL DATA:  Anuria, history of bladder cancer with removal of urinary catheter 4 days ago.  EXAM: CT ABDOMEN AND PELVIS WITHOUT CONTRAST  TECHNIQUE: Multidetector CT imaging of the abdomen and pelvis was performed following the standard protocol without intravenous contrast.  COMPARISON:  DG ABD ACUTE W/CHEST dated 02/27/2013; CT ABD-PEL WO/W CM dated 12/31/2012  FINDINGS: Trace pleural effusions are noted.  Unenhanced CT was performed per clinician order. Lack of IV contrast limits sensitivity and specificity, especially for evaluation of abdominal/pelvic solid viscera. 1 cm lateral segment left hepatic lobe cyst is reidentified. Too small to characterize 5 mm lateral segment left hepatic lobe hypodense lesion image 9 is stable. Gallstones are noted within the decompressed otherwise normal-appearing gallbladder. Motion artifact degrades imaging at multiple levels. Adrenal glands, spleen, and pancreas are grossly unremarkable.  There has been interval development of mild hydroureteronephrosis to the level of  the bladder which is partly  decompressed but demonstrates partly visualized wall thickening of at least 1 cm image 67. A Foley catheter is in place. At the right bladder apex, there is a 0.7 cm dominant lymph node as well as other similar-appearing enlarged supravesical lymph nodes for example image 57. 0.8 cm periaortic node identified image 40. Pericaval 0.7 cm lymph node identified image 51, not significantly changed. The supravesical nodes are increase in size since previously but the dominant periaortic node is stable in size. There is trace fluid tracking along the ureters tracking to the pelvis. Marked enlargement of the prostate is again noted. Bilateral small inguinal nodes are stable.  No bowel wall thickening or focal segmental dilatation. Moderate atheromatous aortic calcification without aneurysm.  The bones are subjectively osteopenic. Multilevel disc degenerative changes noted. No new lytic or sclerotic osseous lesion is identified. Lower thoracic loss of vertebral body height is reidentified.  IMPRESSION: Diffusely thick walled bladder with mild proximal bilateral hydroureteronephrosis. Given the history of bladder cancer, this may indicate tumor involvement although cystitis or posttreatment change could appear similar.  Retroperitoneal and supravesical lymphadenopathy, with interval increase in size of the supravesical nodes which could be neoplastic or inflammatory.  New trace bilateral pleural effusions.   Electronically Signed   By: Conchita Paris M.D.   On: 02/27/2013 16:45   US Renal  03/03/2013   CLINICAL DATA:  Bladder cancer.  Hematuria.  EXAM: RENAL/URINARY TRACT ULTRASOUND COMPLETE  COMPARISON:  02/27/2013 CT.  FINDINGS: Right Kidney:  Length: 10.3 cm. Echogenicity within normal limits. No mass or hydronephrosis visualized.  Left Kidney:  Length: 10.3 cm. Echogenicity within normal limits. No mass or hydronephrosis visualized.  Bladder:  Foley catheter in place. Debris within the  bladder. Bladder is partially decompressed. Limited for detection of bladder mass.  IMPRESSION: No hydronephrosis or renal mass identified.  Decompressed bladder (Foley catheter in place) contains debris. Limited for evaluating for bladder mass.   Electronically Signed   By: Chauncey Cruel M.D.   On: 03/03/2013 15:44   Dg Abd 2 Views  03/11/2013   CLINICAL DATA:  Abdominal pain and back pain  EXAM: ABDOMEN - 2 VIEW  COMPARISON:  None.  FINDINGS: Decubitus radiograph does not show evidence of free air. There is mild to moderate fecal retention throughout the proximal half of the colon. A rectal tube it is identified. There are no abnormally dilated loops of bowel.  IMPRESSION: Bowel gas pattern within normal limits   Electronically Signed   By: Skipper Cliche M.D.   On: 03/11/2013 17:30   Dg Abd Acute W/chest  02/27/2013   CLINICAL DATA:  Abdominal distention. Unable to void. Bladder cancer.  EXAM: ACUTE ABDOMEN SERIES (ABDOMEN 2 VIEW & CHEST 1 VIEW)  COMPARISON:  Two-view chest x-ray 03/29/2012. CT of the abdomen and pelvis 12/31/2012.  FINDINGS: Heart is enlarged. Chronic interstitial coarsening is evident. No focal airspace disease is evident.  There is mild distention of the transverse colon. The transition point appears to be just beyond the splenic flexure. No free air is evident. The SI joints are fused bilaterally with findings suggesting ankylosing spondylitis.  IMPRESSION: 1. Mild distention of transverse and proximal colon with a transition point just beyond the and splenic flexure. This raises the possibility of diverticulitis. 2. Stable cardiomegaly without failure. 3. Fusion of the SI joints bilaterally and findings suggesting ankylosing spondylitis.   Electronically Signed   By: Lawrence Santiago M.D.   On: 02/27/2013 14:24    Microbiology: No results found for  this or any previous visit (from the past 240 hour(s)).   Labs: Basic Metabolic Panel:  Recent Labs Lab 03/10/13 1049  03/11/13 0418 03/13/13 0333 03/14/13 0318 03/15/13 0349  NA 141 144 142 142 142  K 3.8 3.9 4.5 4.0 3.9  CL 102 106 108 105 106  CO2 28 24 24 25 28   GLUCOSE 113* 124* 120* 119* 112*  BUN 70* 66* 54* 42* 37*  CREATININE 4.4* 3.33* 2.57* 1.96* 1.62*  CALCIUM 8.3* 8.2* 8.5 8.5 8.5   Liver Function Tests: No results found for this basename: AST, ALT, ALKPHOS, BILITOT, PROT, ALBUMIN,  in the last 168 hours No results found for this basename: LIPASE, AMYLASE,  in the last 168 hours No results found for this basename: AMMONIA,  in the last 168 hours CBC:  Recent Labs Lab 03/11/13 0418 03/12/13 0315 03/13/13 0333 03/14/13 0318 03/15/13 0349  WBC 9.0 8.7 9.9 8.6 8.2  HGB 9.8* 9.2* 9.1* 9.3* 9.3*  HCT 30.6* 30.0* 29.5* 29.6* 30.1*  MCV 91.6 92.0 92.5 92.2 92.0  PLT 214 200 200 205 187   Cardiac Enzymes: No results found for this basename: CKTOTAL, CKMB, CKMBINDEX, TROPONINI,  in the last 168 hours BNP: BNP (last 3 results)  Recent Labs  03/29/12 1615  PROBNP 2069.0*   CBG: No results found for this basename: GLUCAP,  in the last 168 hours  Time coordinating discharge: Over 30 minutes

## 2013-03-15 NOTE — Progress Notes (Signed)
ANTICOAGULATION CONSULT NOTE - Follow up  Pharmacy Consult for warfarin Indication: atrial fibrillation, AVR  Allergies  Allergen Reactions  . Ace Inhibitors Other (See Comments)    Dizziness   . Codeine Nausea And Vomiting  . Sulfa Drugs Cross Reactors Nausea And Vomiting    Patient Measurements: Height: 5\' 9"  (175.3 cm) Weight: 153 lb 11.2 oz (69.718 kg) IBW/kg (Calculated) : 70.7   Vital Signs: Temp: 97.5 F (36.4 C) (03/07 0451) Temp src: Oral (03/07 0451) BP: 104/60 mmHg (03/07 0451) Pulse Rate: 85 (03/07 0451)  Labs:  Recent Labs  03/13/13 0333 03/14/13 0318 03/15/13 0349  HGB 9.1* 9.3* 9.3*  HCT 29.5* 29.6* 30.1*  PLT 200 205 187  LABPROT 27.1* 23.1* 21.9*  INR 2.62* 2.12* 1.98*  CREATININE 2.57* 1.96* 1.62*    Estimated Creatinine Clearance: 29.3 ml/min (by C-G formula based on Cr of 1.62).   Assessment: 46 yoM admitted 3/2 from PCP w nausea and abnormal labs. S/p bladder cancer resection in past month. On chronic warfarin PTA for atrial fibrillation and AVR. Pharmacy has been consulted to continue warfarin while inpatient.  Home dose beginning Tues, 03/11/13, take 1/2 tab (2.5mg ) once daily except 1 tab (5mg ) on M/F- previous dosing was 2.5mg  daily except 5mg  MWF.  INR 4.1 on 3/2 at outpatient clinic. Likely d/t 10 day course of doxycycline started 2/26. Continued inpatient.  3/4: patient noted to be passing blood clots from bladder so warfarin was held.  3/5: patient was still passing pink urine with clots so warfarin was held.  3/6: No further bleeding noted so warfarin resumed with a conservative 3mg  dose.  3/7: INR subtherapeutic and trending down. CBC stable. No further bleeding reported.  Goal of Therapy:  INR 2.5-3.5 Monitor platelets by anticoagulation protocol: Yes   Plan:  Give Coumadin 5mg  today.   If discharged, recommend giving 5mg  on 3/7 and 3/8, then resuming home regimen of 2.5mg  daily except 5mg  on Mon and Fri, with close f/u  by Coumadin clinic.  Daily PT/INR.  Note: Doxycycline was originally schedule to end today 3/7.   Romeo Rabon, PharmD, pager [phone]. 03/15/2013,10:21 AM.

## 2013-03-15 NOTE — Discharge Instructions (Signed)
Malnutrition Many of [place] think of malnutrition as a condition in which there is not enough to eat. Malnutrition is actually any condition where nutrition is poor. This means:  Too much to eat as we see in conditions of obesity.  Too little to eat with starvation. The following information is only for the malnourished with dietary deficiencies (poor diet). CAUSES  Under-nutrition can result from:  Poor intake.  Malabsorption  Lactation  Bleeding  Diarrhea  Old age.  Kidney failure.  Infancy  Poverty  Infection  Adolescence  Excessive sweating  Drug addiction  Pregnancy  Early childhood. Under-nutrition comes anytime the demand is more than the intake. SYMPTOMS  The problems depend on what type of malnutrition is present. Some general symptoms include:  Fatigue.  Dizziness.  Fainting  Weight loss.  Poor immune response.  Lack of menstruation.  Lack of growth in children.  Hair loss. DIAGNOSIS  Your caregiver will usually suspect malnutrition based on results of your:   Medical and dietary history.  Physical exam. This will often include measurements of your BMI (body mass index).  Perhaps some blood tests. These may include: plasma levels of nutrients and nutrient-dependent substances, such as:  Hemoglobin  Thyroid hormones  Transferrin  Albumin RISK FACTORS Persons in the following circumstances may be at risk of malnutrition.  Infants and children are at risk of under-nutrition. This is because of their high demand for energy and essential nutrients. Protein-energy malnutrition in children consuming inadequate amounts of protein, calories, and other nutrients is a particularly severe form of under-nutrition that delays growth and development. This includes [place].  Hemorrhagic disease of the newborn is a life-threatening disorder. This is due to a lack of vitamin K, iron, folic acid, vitamin C, copper, zinc, and vitamin  A. This may occur in inadequately fed infants and children.  In adolescence, nutritional requirements increase because they are growing. Anorexia nervosa, a form of starvation, may affect adolescents.  Pregnancy and lactation. Requirements for all nutrients are increased during pregnancy and lactation.  Abnormal diets, such as pica (the consumption of nonnutritive substances, such as clay and charcoal), are common in pregnancy.  Anemia due to folic acid deficiency is common in pregnant women. This is especially true for those who have taken oral contraceptives. Folic acid supplements are now recommended for pregnant women. Folic acid prevents neural tube defects (spina bifida) in children.  Breast-fed-only infants may develop vitamin B12 deficiency if the mother is a vegan.  An alcoholic mother may have a handicapped and stunted child with fetal alcohol syndrome. This is due to the effects of alcohol on the fetus. Do not drink during pregnancy.  Old age: A weakened sense of taste and smell, loneliness, physical and mental handicaps, immobility, and chronic illness can hurt the food intake in the elderly. Absorption is reduced. This may add to iron deficiency, calcium and bone problems and also a softening of the bones due to lack of vitamin D. This is also made worse by not being in the sun.  With aging, we loose lean body mass. These changes and a reduction in physical activity result in lower energy and protein requirements compared with those of younger adults.  Chronic disease including malabsorption states (including those resulting from surgery) tend to impair the absorption of fat-soluble vitamins, vitamin B12, calcium, and iron.  Liver disease impairs the storage of vitamins A and B12. It also interferes with the metabolism of protein and energy sources.  Kidney disease may  cause deficiencies of protein, iron, and vitamin D.  Cancer and AIDS may cause anorexia. This is a loss of  appetite.  Vegetarian diet. The most common form of this type of diet is when meat and fish are not eaten, but eggs and dairy products are eaten. Iron deficiency is the only risk. Ovo-lacto vegetarians tend to live longer and to develop fewer chronic disabling conditions than their meat-eating peers. However, their lifestyle usually includes regular exercise and abstention from alcohol and tobacco. This may contribute to better health. Vegans consume no animal products and are susceptible to vitamin B12 deficiency. Yeast extracts and oriental-style fermented foods provide this vitamin. Intake of calcium, iron, and zinc also tends to be low. A fruitarian diet (eat only fruit) is deficient in protein, salt, and many micronutrients. This is not recommended.  Fad diets: Many commercial diets are claimed to enhance well-being or reduce weight. A physician should be alert to early evidence of nutrient deficiency or toxicity in patients on these diets. Such diets have resulted in vitamin, mineral, and protein deficiency states and cardiac, renal, and metabolic disorders. Some fad diets have resulted in death. People on very low calorie diets (less than 400 kcal/day) cannot sustain health for long. Some trace mineral supplements have induced toxicity.  Alcohol or drug dependency: Addiction leads to a troubled lifestyle in which adequate nourishment is ignored. Absorption and metabolism of nutrients are impaired. High levels of alcohol are poisonous. Too much alcohol can cause tissue injury, particularly of the GI tract, liver, pancreas, brain, and peripheral nervous system. Beer drinkers who consume food may gain weight, but alcoholics who use more than one quart of hard liquor per day lose weight and become undernourished. Drug addicts are usually very skinny. Alcoholism is the most common cause of thiamine deficiency and may lead to deficiencies of magnesium, zinc, and other vitamins. TREATMENT  Get treatment if  you experience changes in how your body is working.  PREVENTION  Eating a good, well-balanced diet helps to prevent most forms of malnutrition. Document Released: 11/11/2004 Document Revised: 03/20/2011 Document Reviewed: 12/03/2006 [facility] Patient Information 2014 [place], [place].   Heart Failure Heart failure is a condition in which the heart has trouble pumping blood. This means your heart does not pump blood efficiently for your body to work well. In some cases of heart failure, fluid may back up into your lungs or you may have swelling (edema) in your lower legs. Heart failure is usually a long-term (chronic) condition. It is important for you to take good care of yourself and follow your caregiver's treatment plan. CAUSES  Some health conditions can cause heart failure. Those health conditions include:  High blood pressure (hypertension) causes the heart muscle to work harder than normal. When pressure in the blood vessels is high, the heart needs to pump (contract) with more force in order to circulate blood throughout the body. High blood pressure eventually causes the heart to become stiff and weak.  Coronary artery disease (CAD) is the buildup of cholesterol and fat (plaque) in the arteries of the heart. The blockage in the arteries deprives the heart muscle of oxygen and blood. This can cause chest pain and may lead to a heart attack. High blood pressure can also contribute to CAD.  Heart attack (myocardial infarction) occurs when 1 or more arteries in the heart become blocked. The loss of oxygen damages the muscle tissue of the heart. When this happens, part of the heart muscle dies. The injured tissue  does not contract as well and weakens the heart's ability to pump blood.  Abnormal heart valves can cause heart failure when the heart valves do not open and close properly. This makes the heart muscle pump harder to keep the blood flowing.  Heart muscle disease (cardiomyopathy or  myocarditis) is damage to the heart muscle from a variety of causes. These can include drug or alcohol abuse, infections, or unknown reasons. These can increase the risk of heart failure.  Lung disease makes the heart work harder because the lungs do not work properly. This can cause a strain on the heart, leading it to fail.  Diabetes increases the risk of heart failure. High blood sugar contributes to high fat (lipid) levels in the blood. Diabetes can also cause slow damage to tiny blood vessels that carry important nutrients to the heart muscle. When the heart does not get enough oxygen and food, it can cause the heart to become weak and stiff. This leads to a heart that does not contract efficiently.  Other conditions can contribute to heart failure. These include abnormal heart rhythms, thyroid problems, and low blood counts (anemia). Certain unhealthy behaviors can increase the risk of heart failure. Those unhealthy behaviors include:  Being overweight.  Smoking or chewing tobacco.  Eating foods high in fat and cholesterol.  Abusing illicit drugs or alcohol.  Lacking physical activity. SYMPTOMS  Heart failure symptoms may vary and can be hard to detect. Symptoms may include:  Shortness of breath with activity, such as climbing stairs.  Persistent cough.  Swelling of the feet, ankles, legs, or abdomen.  Unexplained weight gain.  Difficulty breathing when lying flat (orthopnea).  Waking from sleep because of the need to sit up and get more air.  Rapid heartbeat.  Fatigue and loss of energy.  Feeling lightheaded, dizzy, or close to fainting.  Loss of appetite.  Nausea.  Increased urination during the night (nocturia). DIAGNOSIS  A diagnosis of heart failure is based on your history, symptoms, physical examination, and diagnostic tests. Diagnostic tests for heart failure may include:  Echocardiography.  Electrocardiography.  Chest X-ray.  Blood  tests.  Exercise stress test.  Cardiac angiography.  Radionuclide scans. TREATMENT  Treatment is aimed at managing the symptoms of heart failure. Medicines, behavioral changes, or surgical intervention may be necessary to treat heart failure.  Medicines to help treat heart failure may include:  Angiotensin-converting enzyme (ACE) inhibitors. This type of medicine blocks the effects of a blood protein called angiotensin-converting enzyme. ACE inhibitors relax (dilate) the blood vessels and help lower blood pressure.  Angiotensin receptor blockers. This type of medicine blocks the actions of a blood protein called angiotensin. Angiotensin receptor blockers dilate the blood vessels and help lower blood pressure.  Water pills (diuretics). Diuretics cause the kidneys to remove salt and water from the blood. The extra fluid is removed through urination. This loss of extra fluid lowers the volume of blood the heart pumps.  Beta blockers. These prevent the heart from beating too fast and improve heart muscle strength.  Digitalis. This increases the force of the heartbeat.  Healthy behavior changes include:  Obtaining and maintaining a healthy weight.  Stopping smoking or chewing tobacco.  Eating heart healthy foods.  Limiting or avoiding alcohol.  Stopping illicit drug use.  Physical activity as directed by your caregiver.  Surgical treatment for heart failure may include:  A procedure to open blocked arteries, repair damaged heart valves, or remove damaged heart muscle tissue.  A pacemaker  to improve heart muscle function and control certain abnormal heart rhythms.  An internal cardioverter defibrillator to treat certain serious abnormal heart rhythms.  A left ventricular assist device to assist the pumping ability of the heart. HOME CARE INSTRUCTIONS   Take your medicine as directed by your caregiver. Medicines are important in reducing the workload of your heart, slowing the  progression of heart failure, and improving your symptoms.  Do not stop taking your medicine unless directed by your caregiver.  Do not skip any dose of medicine.  Refill your prescriptions before you run out of medicine. Your medicines are needed every day.  Take over-the-counter medicine only as directed by your caregiver or pharmacist.  Engage in moderate physical activity if directed by your caregiver. Moderate physical activity can benefit some people. The elderly and people with severe heart failure should consult with a caregiver for physical activity recommendations.  Eat heart healthy foods. Food choices should be free of trans fat and low in saturated fat, cholesterol, and salt (sodium). Healthy choices include fresh or frozen fruits and vegetables, fish, lean meats, legumes, fat-free or low-fat dairy products, and whole grain or high fiber foods. Talk to a dietitian to learn more about heart healthy foods.  Limit sodium if directed by your caregiver. Sodium restriction may reduce symptoms of heart failure in some people. Talk to a dietitian to learn more about heart healthy seasonings.  Use healthy cooking methods. Healthy cooking methods include roasting, grilling, broiling, baking, poaching, steaming, or stir-frying. Talk to a dietitian to learn more about healthy cooking methods.  Limit fluids if directed by your caregiver. Fluid restriction may reduce symptoms of heart failure in some people.  Weigh yourself every day. Daily weights are important in the early recognition of excess fluid. You should weigh yourself every morning after you urinate and before you eat breakfast. Wear the same amount of clothing each time you weigh yourself. Record your daily weight. Provide your caregiver with your weight record.  Monitor and record your blood pressure if directed by your caregiver.  Check your pulse if directed by your caregiver.  Lose weight if directed by your caregiver. Weight  loss may reduce symptoms of heart failure in some people.  Stop smoking or chewing tobacco. Nicotine makes your heart work harder by causing your blood vessels to constrict. Do not use nicotine gum or patches before talking to your caregiver.  Schedule and attend follow-up visits as directed by your caregiver. It is important to keep all your appointments.  Limit alcohol intake to no more than 1 drink per day for nonpregnant women and 2 drinks per day for men. Drinking more than that is harmful to your heart. Tell your caregiver if you drink alcohol several times a week. Talk with your caregiver about whether alcohol is safe for you. If your heart has already been damaged by alcohol or you have severe heart failure, drinking alcohol should be stopped completely.  Stop illicit drug use.  Stay up-to-date with immunizations. It is especially important to prevent respiratory infections through current pneumococcal and influenza immunizations.  Manage other health conditions such as hypertension, diabetes, thyroid disease, or abnormal heart rhythms as directed by your caregiver.  Learn to manage stress.  Plan rest periods when fatigued.  Learn strategies to manage high temperatures. If the weather is extremely hot:  Avoid vigorous physical activity.  Use air conditioning or fans or seek a cooler location.  Avoid caffeine and alcohol.  Wear loose-fitting, lightweight, and  light-colored clothing.  Learn strategies to manage cold temperatures. If the weather is extremely cold:  Avoid vigorous physical activity.  Layer clothes.  Wear mittens or gloves, a hat, and a scarf when going outside.  Avoid alcohol.  Obtain ongoing education and support as needed.  Participate or seek rehabilitation as needed to maintain or improve independence and quality of life. SEEK MEDICAL CARE IF:   Your weight increases by 03 lb/1.4 kg in 1 day or 05 lb/2.3 kg in a week.  You have increasing  shortness of breath that is unusual for you.  You are unable to participate in your usual physical activities.  You tire easily.  You cough more than normal, especially with physical activity.  You have any or more swelling in areas such as your hands, feet, ankles, or abdomen.  You are unable to sleep because it is hard to breathe.  You feel like your heart is beating fast (palpitations).  You become dizzy or lightheaded upon standing up. SEEK IMMEDIATE MEDICAL CARE IF:   You have difficulty breathing.  There is a change in mental status such as decreased alertness or difficulty with concentration.  You have a pain or discomfort in your chest.  You have an episode of fainting (syncope). MAKE SURE YOU:   Understand these instructions.  Will watch your condition.  Will get help right away if you are not doing well or get worse. Document Released: 12/26/2004 Document Revised: 04/22/2012 Document Reviewed: 01/18/2012 [facility] Patient Information 2014 [place], [place].   Hematuria, Adult Hematuria is blood in your urine. It can be caused by a bladder infection, kidney infection, prostate infection, kidney stone, or cancer of your urinary tract. Infections can usually be treated with medicine, and a kidney stone usually will pass through your urine. If neither of these is the cause of your hematuria, further workup to find out the reason may be needed. It is very important that you tell your health care provider about any blood you see in your urine, even if the blood stops without treatment or happens without causing pain. Blood in your urine that happens and then stops and then happens again can be a symptom of a very serious condition. Also, pain is not a symptom in the initial stages of many urinary cancers. HOME CARE INSTRUCTIONS   Drink lots of fluid, 3 4 quarts a day. If you have been diagnosed with an infection, cranberry juice is especially recommended, in addition to large  amounts of water.  Avoid caffeine, tea, and carbonated beverages, because they tend to irritate the bladder.  Avoid alcohol because it may irritate the prostate.  Only take over-the-counter or prescription medicines for pain, discomfort, or fever as directed by your health care provider.  If you have been diagnosed with a kidney stone, follow your health care provider's instructions regarding straining your urine to catch the stone.  Empty your bladder often. Avoid holding urine for long periods of time.  After a bowel movement, women should cleanse front to back. Use each tissue only once.  Empty your bladder before and after sexual intercourse if you are a male. SEEK MEDICAL CARE IF: You develop back pain, fever, a feeling of sickness in your stomach (nausea), or vomiting or if your symptoms are not better in 3 days. Return sooner if you are getting worse. SEEK IMMEDIATE MEDICAL CARE IF:   You have a persistent fever, with a temperature of 101.21F (38.8C) or greater.  You develop severe vomiting and  are unable to keep the medicine down.  You develop severe back or abdominal pain despite taking your medicines.  You begin passing a large amount of blood or clots in your urine.  You feel extremely weak or faint, or you pass out. MAKE SURE YOU:   Understand these instructions.  Will watch your condition.  Will get help right away if you are not doing well or get worse. Document Released: 12/26/2004 Document Revised: 10/16/2012 Document Reviewed: 08/26/2012 [facility] Patient Information 2014 [place].

## 2013-03-16 NOTE — Progress Notes (Signed)
Patient discharged to home with family, discharge instructions reviewed with patient, wife and daughter who verbalized understanding. Instructions for foley catheter care given to and demonstrated with wife who verbalized understanding.

## 2013-03-17 DIAGNOSIS — N039 Chronic nephritic syndrome with unspecified morphologic changes: Secondary | ICD-10-CM | POA: Diagnosis not present

## 2013-03-17 DIAGNOSIS — I509 Heart failure, unspecified: Secondary | ICD-10-CM | POA: Diagnosis not present

## 2013-03-17 DIAGNOSIS — Z5181 Encounter for therapeutic drug level monitoring: Secondary | ICD-10-CM | POA: Diagnosis not present

## 2013-03-17 DIAGNOSIS — I5043 Acute on chronic combined systolic (congestive) and diastolic (congestive) heart failure: Secondary | ICD-10-CM | POA: Diagnosis not present

## 2013-03-17 DIAGNOSIS — Z7901 Long term (current) use of anticoagulants: Secondary | ICD-10-CM | POA: Diagnosis not present

## 2013-03-17 DIAGNOSIS — C679 Malignant neoplasm of bladder, unspecified: Secondary | ICD-10-CM | POA: Diagnosis not present

## 2013-03-17 DIAGNOSIS — Z466 Encounter for fitting and adjustment of urinary device: Secondary | ICD-10-CM | POA: Diagnosis not present

## 2013-03-17 DIAGNOSIS — R627 Adult failure to thrive: Secondary | ICD-10-CM | POA: Diagnosis not present

## 2013-03-17 DIAGNOSIS — N189 Chronic kidney disease, unspecified: Secondary | ICD-10-CM | POA: Diagnosis not present

## 2013-03-17 DIAGNOSIS — D631 Anemia in chronic kidney disease: Secondary | ICD-10-CM | POA: Diagnosis not present

## 2013-03-17 DIAGNOSIS — I4891 Unspecified atrial fibrillation: Secondary | ICD-10-CM | POA: Diagnosis not present

## 2013-03-19 DIAGNOSIS — C679 Malignant neoplasm of bladder, unspecified: Secondary | ICD-10-CM | POA: Diagnosis not present

## 2013-03-19 DIAGNOSIS — I509 Heart failure, unspecified: Secondary | ICD-10-CM | POA: Diagnosis not present

## 2013-03-19 DIAGNOSIS — N189 Chronic kidney disease, unspecified: Secondary | ICD-10-CM | POA: Diagnosis not present

## 2013-03-19 DIAGNOSIS — D631 Anemia in chronic kidney disease: Secondary | ICD-10-CM | POA: Diagnosis not present

## 2013-03-19 DIAGNOSIS — I4891 Unspecified atrial fibrillation: Secondary | ICD-10-CM | POA: Diagnosis not present

## 2013-03-19 DIAGNOSIS — N039 Chronic nephritic syndrome with unspecified morphologic changes: Secondary | ICD-10-CM | POA: Diagnosis not present

## 2013-03-19 DIAGNOSIS — I5043 Acute on chronic combined systolic (congestive) and diastolic (congestive) heart failure: Secondary | ICD-10-CM | POA: Diagnosis not present

## 2013-03-20 ENCOUNTER — Other Ambulatory Visit: Payer: Self-pay

## 2013-03-20 ENCOUNTER — Telehealth: Payer: Self-pay | Admitting: Interventional Cardiology

## 2013-03-20 DIAGNOSIS — I5043 Acute on chronic combined systolic (congestive) and diastolic (congestive) heart failure: Secondary | ICD-10-CM | POA: Diagnosis not present

## 2013-03-20 DIAGNOSIS — N189 Chronic kidney disease, unspecified: Secondary | ICD-10-CM | POA: Diagnosis not present

## 2013-03-20 DIAGNOSIS — D631 Anemia in chronic kidney disease: Secondary | ICD-10-CM | POA: Diagnosis not present

## 2013-03-20 DIAGNOSIS — I509 Heart failure, unspecified: Secondary | ICD-10-CM | POA: Diagnosis not present

## 2013-03-20 DIAGNOSIS — C679 Malignant neoplasm of bladder, unspecified: Secondary | ICD-10-CM | POA: Diagnosis not present

## 2013-03-20 DIAGNOSIS — I4891 Unspecified atrial fibrillation: Secondary | ICD-10-CM | POA: Diagnosis not present

## 2013-03-20 MED ORDER — FUROSEMIDE 40 MG PO TABS: 20.0000 mg | ORAL_TABLET | Freq: Every day | ORAL | Status: DC

## 2013-03-20 NOTE — Telephone Encounter (Signed)
Spoke with Mickel Baas with [facility].  She has already completed pt visit for today but pt has appt with Cecille Rubin and Coumadin clinic tomorrow.  Will see in office at that time then call Mickel Baas with follow up orders to check INR in pt's home.

## 2013-03-20 NOTE — Telephone Encounter (Signed)
New Prob    Wanting to know when [facility] should be checked again. [facility] does not have orders. Please call.

## 2013-03-20 NOTE — Telephone Encounter (Signed)
routed to [employer], Pharm-D

## 2013-03-21 ENCOUNTER — Encounter: Payer: Self-pay | Admitting: Nurse Practitioner

## 2013-03-21 ENCOUNTER — Telehealth: Payer: Self-pay | Admitting: Nurse Practitioner

## 2013-03-21 ENCOUNTER — Ambulatory Visit (INDEPENDENT_AMBULATORY_CARE_PROVIDER_SITE_OTHER): Payer: Medicare Other | Admitting: *Deleted

## 2013-03-21 ENCOUNTER — Ambulatory Visit (INDEPENDENT_AMBULATORY_CARE_PROVIDER_SITE_OTHER): Payer: Medicare Other | Admitting: Nurse Practitioner

## 2013-03-21 VITALS — BP 108/60 | HR 90 | Ht 69.0 in | Wt 159.8 lb

## 2013-03-21 DIAGNOSIS — Z952 Presence of prosthetic heart valve: Secondary | ICD-10-CM

## 2013-03-21 DIAGNOSIS — Z7901 Long term (current) use of anticoagulants: Secondary | ICD-10-CM

## 2013-03-21 DIAGNOSIS — Z79899 Other long term (current) drug therapy: Secondary | ICD-10-CM

## 2013-03-21 DIAGNOSIS — Z954 Presence of other heart-valve replacement: Secondary | ICD-10-CM

## 2013-03-21 DIAGNOSIS — I5043 Acute on chronic combined systolic (congestive) and diastolic (congestive) heart failure: Secondary | ICD-10-CM | POA: Diagnosis not present

## 2013-03-21 DIAGNOSIS — N189 Chronic kidney disease, unspecified: Secondary | ICD-10-CM | POA: Diagnosis not present

## 2013-03-21 DIAGNOSIS — C679 Malignant neoplasm of bladder, unspecified: Secondary | ICD-10-CM | POA: Diagnosis not present

## 2013-03-21 DIAGNOSIS — D631 Anemia in chronic kidney disease: Secondary | ICD-10-CM | POA: Diagnosis not present

## 2013-03-21 DIAGNOSIS — I4891 Unspecified atrial fibrillation: Secondary | ICD-10-CM | POA: Diagnosis not present

## 2013-03-21 DIAGNOSIS — I509 Heart failure, unspecified: Secondary | ICD-10-CM | POA: Diagnosis not present

## 2013-03-21 DIAGNOSIS — I482 Chronic atrial fibrillation, unspecified: Secondary | ICD-10-CM

## 2013-03-21 LAB — BASIC METABOLIC PANEL
BUN: 31 mg/dL — ABNORMAL HIGH (ref 6–23)
CO2: 29 mEq/L (ref 19–32)
Calcium: 8.7 mg/dL (ref 8.4–10.5)
Chloride: 103 mEq/L (ref 96–112)
Creatinine, Ser: 1.4 mg/dL (ref 0.4–1.5)
GFR: 50.44 mL/min — ABNORMAL LOW (ref >60.00)
Glucose, Bld: 101 mg/dL — ABNORMAL HIGH (ref 70–99)
Potassium: 4.2 mEq/L (ref 3.5–5.1)
Sodium: 140 mEq/L (ref 135–145)

## 2013-03-21 LAB — CBC
HCT: 33.7 % — ABNORMAL LOW (ref 39.0–52.0)
Hemoglobin: 10.8 g/dL — ABNORMAL LOW (ref 13.0–17.0)
MCHC: 32.2 g/dL (ref 30.0–36.0)
MCV: 89.9 fl (ref 78.0–100.0)
Platelets: 190 10*3/uL (ref 150.0–400.0)
RBC: 3.74 Mil/uL — ABNORMAL LOW (ref 4.22–5.81)
RDW: 17 % — ABNORMAL HIGH (ref 11.5–14.6)
WBC: 10.6 10*3/uL — ABNORMAL HIGH (ref 4.5–10.5)

## 2013-03-21 LAB — POCT INR: INR: 3

## 2013-03-21 MED ORDER — FUROSEMIDE 40 MG PO TABS: 20.0000 mg | ORAL_TABLET | Freq: Every day | ORAL | Status: DC | PRN

## 2013-03-21 NOTE — Telephone Encounter (Signed)
lmovm pt is currently in our office for an appointment with Felisa Bonier RN

## 2013-03-21 NOTE — Progress Notes (Signed)
Eric Wheeler Date of Birth: 08-23-1919 Medical Record [id number]  History of Present Illness: [name] is seen back today for a post hospital check. Seen for Dr. Percival Spanish. Former patient of Dr. Susa Simmonds. Has had chronic systolic heart failure with an EF of 45 to 50%, prior AVR back in 1987, HTN and chronic atrial fib. Remains on chronic coumadin. Intolerant to ACE due to dizziness. Coumadin is now monitored thru our office.   I saw him last in August - he was slowing down. Had stopped driving. No chest pain. Continued to have these spells where he "stops breathing". I have tried to suggest further cardiac testing but he has declined.  Since then, he has had a large bladder tumor resected - complicated by hematuria, obstruction with acute renal failure over diuresis and overall failure to thrive. He has been readmitted several times since the original surgery.   Comes back today. Here with - Eric Wheeler (son in law) at # [phone] or [phone]. Eric Wheeler Still looks pretty weak. Says his breathing is stable. No chest pain. Was not eating - now on Megace. Not drinking enough. Not walking. To have his foley taken out later this month and will be placed on antibiotics prior. Home health is coming as well as PT. Noted to be orthostatic by phone call from yesterday. No problems with the coumadin. Urine now clear.   Current Outpatient Prescriptions  Medication Sig Dispense Refill  . carvedilol (COREG) 6.25 MG tablet Take 6.25 mg by mouth 2 (two) times daily with a meal.      . docusate sodium 100 MG CAPS Take 100 mg by mouth 2 (two) times daily as needed for mild constipation.  10 capsule  0  . feeding supplement, ENSURE COMPLETE, (ENSURE COMPLETE) LIQD Take 237 mLs by mouth 2 (two) times daily between meals.  10 Bottle  0  . feeding supplement, ENSURE, (ENSURE) PUDG Take 1 Container by mouth 3 (three) times daily between meals.  10 Container  0  . finasteride (PROSCAR) 5 MG tablet Take 5 mg by mouth  every morning.       . furosemide (LASIX) 40 MG tablet Take 0.5 tablets (20 mg total) by mouth daily. Take 1 tablet once daily as needed, may take an additional tablet in the afternoon if weight is over 2lbs.  30 tablet  3  . megestrol (MEGACE) 400 MG/10ML suspension Take 5 mLs (200 mg total) by mouth 2 (two) times daily.  240 mL  0  . ondansetron (ZOFRAN-ODT) 4 MG disintegrating tablet Take 4 mg by mouth every 6 (six) hours as needed for nausea or vomiting.      . pantoprazole (PROTONIX) 40 MG tablet Take 1 tablet (40 mg total) by mouth daily.  30 tablet  0  . phenazopyridine (PYRIDIUM) 100 MG tablet Take 100 mg by mouth 3 (three) times daily as needed for pain.      . potassium chloride (KLOR-CON) 20 MEQ packet Take 10 mEq by mouth daily.  30 tablet  0  . simvastatin (ZOCOR) 10 MG tablet Take 10 mg by mouth at bedtime.      Eric Wheeler terazosin (HYTRIN) 5 MG capsule Take 5 mg by mouth at bedtime.        . traMADol (ULTRAM) 50 MG tablet Take 50 mg by mouth every 6 (six) hours as needed for moderate pain.      Eric Wheeler warfarin (COUMADIN) 5 MG tablet Beginning Tues, 03/11/13, take 1/2 tab (2.5mg ) once daily except  every 4th day take 1 tab (5mg ) once daily.       No current facility-administered medications for this visit.    Allergies  Allergen Reactions  . Ace Inhibitors Other (See Comments)    Dizziness   . Codeine Nausea And Vomiting  . Sulfa Drugs Cross Reactors Nausea And Vomiting    Past Medical History  Diagnosis Date  . Chronic atrial fibrillation   . Chronic anticoagulation     a. afib/st. jude avr - goal 2.5-3.5   . BPH (benign prostatic hypertrophy)   . Hypertension   . History of kidney stones   . Gout   . Syncope   . History of skin cancer   . Bladder tumor     a. s/p resection 02/19/2013.  Eric Wheeler Hx of scarlet fever   . Seizures 2014    evaluated by Dr. Jannifer Franklin at [facility] neurology -  no cause determined - refered back to cardiologist - recommended loop recorder to monitor rythm but pt  refused - continues to have seizures per family - notes in EPIC  . Chronic combined systolic and diastolic CHF (congestive heart failure)     a. EF 45 to 50% per echo in 2011 following exacerbation  . Chronic kidney disease     Past Surgical History  Procedure Laterality Date  . Aortic valve replacement  1987    #21 MM ST JUDE PLACED BY DR.CHARLES WILSON  . Cataract extraction    . Transurethral resection of prostate N/A 02/19/2013    Procedure: TRANSURETHRAL RESECTION OF THE PROSTATE /bladder WITH GYRUS INSTRUMENTS;  Surgeon: Irine Seal, MD;  Location: [facility];  Service: Urology;  Laterality: N/A;  . Cystoscopy N/A 02/19/2013    Procedure: CYSTOSCOPY;  Surgeon: Irine Seal, MD;  Location: [facility];  Service: Urology;  Laterality: N/A;    History  Smoking status  . Former Smoker  . Quit date: 01/10/1971  Smokeless tobacco  . Never Used    History  Alcohol Use No    Family History  Problem Relation Age of Onset  . Cancer Mother   . Heart disease Father   . Asthma Father   . Stroke Father   . Dementia Sister   . Heart disease Brother     Review of Systems: The review of systems is per the HPI.  All other systems were reviewed and are negative.  Physical Exam: BP 108/60  Pulse 90  Ht 5\' 9"  (1.753 m)  Wt 159 lb 12.8 oz (72.485 kg)  BMI 23.59 kg/m2  SpO2 99% Patient is quite frail but in no acute distress. Using a walker. Skin is warm and dry. Color is normal.  HEENT is unremarkable but mucous membranes look dry. Normocephalic/atraumatic. PERRL. Sclera are nonicteric. Neck is supple. No masses. No JVD. Lungs are clear. Cardiac exam shows an irregular rate and rhythm. Rate is fast on my exam. Murmur noted. Abdomen is soft. Extremities are without edema. Gait and ROM are intact. No gross neurologic deficits noted.  Wt Readings from Last 3 Encounters:  03/21/13 159 lb 12.8 oz (72.485 kg)  03/15/13 153 lb 11.2 oz (69.718 kg)  03/06/13 155 lb 6.8 oz (70.5 kg)   Weight was 167  in August of 2014  LABORATORY DATA: PENDING  Lab Results  Component Value Date   WBC 8.2 03/15/2013   HGB 9.3* 03/15/2013   HCT 30.1* 03/15/2013   PLT 187 03/15/2013   GLUCOSE 112* 03/15/2013   CHOL 148 09/20/2012   TRIG  97.0 09/20/2012   HDL 33.50* 09/20/2012   LDLCALC 95 09/20/2012   ALT 11 02/28/2013   AST 9 02/28/2013   NA 142 03/15/2013   K 3.9 03/15/2013   CL 106 03/15/2013   CREATININE 1.62* 03/15/2013   BUN 37* 03/15/2013   CO2 28 03/15/2013   TSH 2.843 02/27/2013   INR 3.0 03/21/2013     Assessment / Plan: 1. Post op bladder tumor resection - complicated by obstruction, acute renal failure, dehydration and overall failure to thrive. Very slow progress.   2. Orthostasis - still looks "dry" to me - weight is down. He is not eating/drinking sufficiently - will change lasix to just PRN  3. Chronic atrial fib - rate is up - I would like to try and leave him on his Coreg for now.   4. Advanced age - has multiple comorbidities - will continue with conservative approach - he has good support at home. Overall prognosis is quite tenuous at best.   5. Remote AVR - last echo in 2011 - don't really feel that we need to repeat yet.    6.  Past history of syncope - I had offered in the past to try and proceed with loop to further define - he has not wanted to proceed. No need for further work up.  I will see him back in a month. Lasix changed to PRN today. Recheck his labs.   Patient is agreeable to this plan and will call if any problems develop in the interim.   Burtis Junes, RN, [facility] [address] [place], [place]  [phone]

## 2013-03-21 NOTE — Telephone Encounter (Signed)
PT returns call stating pt is not symptomatic with blood pressure dropping. I will forward this to Cecille Rubin as pt is in our office now Horton Chin RN

## 2013-03-21 NOTE — Patient Instructions (Addendum)
We need to check labs today  Change the Lasix to just "as needed" - this means for weight gain of 3 pounds overnight  Keep eating and drinking  I will see you in a month  Call the [place] office at [phone] if you have any questions, problems or concerns.

## 2013-03-21 NOTE — Telephone Encounter (Signed)
New message    In the home today   Orthostatic hypotension  asymptomatic  Patient does feel weak.   Sitting  128/68 Standing 98/56  Blood pressure standing  90/48 . On 3/11.   oxygen stats 98-99 %

## 2013-03-21 NOTE — Telephone Encounter (Signed)
Pt seen in office today by [name], NP.

## 2013-03-25 DIAGNOSIS — C679 Malignant neoplasm of bladder, unspecified: Secondary | ICD-10-CM | POA: Diagnosis not present

## 2013-03-25 DIAGNOSIS — I509 Heart failure, unspecified: Secondary | ICD-10-CM | POA: Diagnosis not present

## 2013-03-25 DIAGNOSIS — I5043 Acute on chronic combined systolic (congestive) and diastolic (congestive) heart failure: Secondary | ICD-10-CM | POA: Diagnosis not present

## 2013-03-25 DIAGNOSIS — N189 Chronic kidney disease, unspecified: Secondary | ICD-10-CM | POA: Diagnosis not present

## 2013-03-25 DIAGNOSIS — D631 Anemia in chronic kidney disease: Secondary | ICD-10-CM | POA: Diagnosis not present

## 2013-03-25 DIAGNOSIS — I4891 Unspecified atrial fibrillation: Secondary | ICD-10-CM | POA: Diagnosis not present

## 2013-03-27 DIAGNOSIS — N189 Chronic kidney disease, unspecified: Secondary | ICD-10-CM | POA: Diagnosis not present

## 2013-03-27 DIAGNOSIS — C679 Malignant neoplasm of bladder, unspecified: Secondary | ICD-10-CM | POA: Diagnosis not present

## 2013-03-27 DIAGNOSIS — I5043 Acute on chronic combined systolic (congestive) and diastolic (congestive) heart failure: Secondary | ICD-10-CM | POA: Diagnosis not present

## 2013-03-27 DIAGNOSIS — I509 Heart failure, unspecified: Secondary | ICD-10-CM | POA: Diagnosis not present

## 2013-03-27 DIAGNOSIS — I4891 Unspecified atrial fibrillation: Secondary | ICD-10-CM | POA: Diagnosis not present

## 2013-03-27 DIAGNOSIS — D631 Anemia in chronic kidney disease: Secondary | ICD-10-CM | POA: Diagnosis not present

## 2013-03-28 DIAGNOSIS — I509 Heart failure, unspecified: Secondary | ICD-10-CM | POA: Diagnosis not present

## 2013-03-28 DIAGNOSIS — C679 Malignant neoplasm of bladder, unspecified: Secondary | ICD-10-CM | POA: Diagnosis not present

## 2013-03-28 DIAGNOSIS — I4891 Unspecified atrial fibrillation: Secondary | ICD-10-CM | POA: Diagnosis not present

## 2013-03-28 DIAGNOSIS — N189 Chronic kidney disease, unspecified: Secondary | ICD-10-CM | POA: Diagnosis not present

## 2013-03-28 DIAGNOSIS — I5043 Acute on chronic combined systolic (congestive) and diastolic (congestive) heart failure: Secondary | ICD-10-CM | POA: Diagnosis not present

## 2013-03-28 DIAGNOSIS — D631 Anemia in chronic kidney disease: Secondary | ICD-10-CM | POA: Diagnosis not present

## 2013-04-01 DIAGNOSIS — N189 Chronic kidney disease, unspecified: Secondary | ICD-10-CM | POA: Diagnosis not present

## 2013-04-01 DIAGNOSIS — I5043 Acute on chronic combined systolic (congestive) and diastolic (congestive) heart failure: Secondary | ICD-10-CM | POA: Diagnosis not present

## 2013-04-01 DIAGNOSIS — D631 Anemia in chronic kidney disease: Secondary | ICD-10-CM | POA: Diagnosis not present

## 2013-04-01 DIAGNOSIS — I509 Heart failure, unspecified: Secondary | ICD-10-CM | POA: Diagnosis not present

## 2013-04-01 DIAGNOSIS — C679 Malignant neoplasm of bladder, unspecified: Secondary | ICD-10-CM | POA: Diagnosis not present

## 2013-04-01 DIAGNOSIS — I4891 Unspecified atrial fibrillation: Secondary | ICD-10-CM | POA: Diagnosis not present

## 2013-04-01 DIAGNOSIS — N039 Chronic nephritic syndrome with unspecified morphologic changes: Secondary | ICD-10-CM | POA: Diagnosis not present

## 2013-04-02 DIAGNOSIS — R339 Retention of urine, unspecified: Secondary | ICD-10-CM | POA: Diagnosis not present

## 2013-04-02 DIAGNOSIS — N403 Nodular prostate with lower urinary tract symptoms: Secondary | ICD-10-CM | POA: Diagnosis not present

## 2013-04-02 DIAGNOSIS — C679 Malignant neoplasm of bladder, unspecified: Secondary | ICD-10-CM | POA: Diagnosis not present

## 2013-04-02 DIAGNOSIS — N138 Other obstructive and reflux uropathy: Secondary | ICD-10-CM | POA: Diagnosis not present

## 2013-04-03 DIAGNOSIS — N039 Chronic nephritic syndrome with unspecified morphologic changes: Secondary | ICD-10-CM | POA: Diagnosis not present

## 2013-04-03 DIAGNOSIS — I5043 Acute on chronic combined systolic (congestive) and diastolic (congestive) heart failure: Secondary | ICD-10-CM | POA: Diagnosis not present

## 2013-04-03 DIAGNOSIS — I509 Heart failure, unspecified: Secondary | ICD-10-CM | POA: Diagnosis not present

## 2013-04-03 DIAGNOSIS — I4891 Unspecified atrial fibrillation: Secondary | ICD-10-CM | POA: Diagnosis not present

## 2013-04-03 DIAGNOSIS — N189 Chronic kidney disease, unspecified: Secondary | ICD-10-CM | POA: Diagnosis not present

## 2013-04-03 DIAGNOSIS — C679 Malignant neoplasm of bladder, unspecified: Secondary | ICD-10-CM | POA: Diagnosis not present

## 2013-04-03 DIAGNOSIS — D631 Anemia in chronic kidney disease: Secondary | ICD-10-CM | POA: Diagnosis not present

## 2013-04-04 ENCOUNTER — Ambulatory Visit (INDEPENDENT_AMBULATORY_CARE_PROVIDER_SITE_OTHER): Payer: Medicare Other | Admitting: *Deleted

## 2013-04-04 DIAGNOSIS — I4891 Unspecified atrial fibrillation: Secondary | ICD-10-CM | POA: Diagnosis not present

## 2013-04-04 DIAGNOSIS — Z952 Presence of prosthetic heart valve: Secondary | ICD-10-CM

## 2013-04-04 DIAGNOSIS — I482 Chronic atrial fibrillation, unspecified: Secondary | ICD-10-CM

## 2013-04-04 DIAGNOSIS — Z7901 Long term (current) use of anticoagulants: Secondary | ICD-10-CM | POA: Diagnosis not present

## 2013-04-04 DIAGNOSIS — Z954 Presence of other heart-valve replacement: Secondary | ICD-10-CM

## 2013-04-04 LAB — POCT INR: INR: 4.6

## 2013-04-07 DIAGNOSIS — D631 Anemia in chronic kidney disease: Secondary | ICD-10-CM | POA: Diagnosis not present

## 2013-04-07 DIAGNOSIS — N189 Chronic kidney disease, unspecified: Secondary | ICD-10-CM | POA: Diagnosis not present

## 2013-04-07 DIAGNOSIS — I4891 Unspecified atrial fibrillation: Secondary | ICD-10-CM | POA: Diagnosis not present

## 2013-04-07 DIAGNOSIS — I509 Heart failure, unspecified: Secondary | ICD-10-CM | POA: Diagnosis not present

## 2013-04-07 DIAGNOSIS — I5043 Acute on chronic combined systolic (congestive) and diastolic (congestive) heart failure: Secondary | ICD-10-CM | POA: Diagnosis not present

## 2013-04-07 DIAGNOSIS — C679 Malignant neoplasm of bladder, unspecified: Secondary | ICD-10-CM | POA: Diagnosis not present

## 2013-04-10 DIAGNOSIS — N189 Chronic kidney disease, unspecified: Secondary | ICD-10-CM | POA: Diagnosis not present

## 2013-04-10 DIAGNOSIS — I509 Heart failure, unspecified: Secondary | ICD-10-CM | POA: Diagnosis not present

## 2013-04-10 DIAGNOSIS — I4891 Unspecified atrial fibrillation: Secondary | ICD-10-CM | POA: Diagnosis not present

## 2013-04-10 DIAGNOSIS — C679 Malignant neoplasm of bladder, unspecified: Secondary | ICD-10-CM | POA: Diagnosis not present

## 2013-04-10 DIAGNOSIS — I5043 Acute on chronic combined systolic (congestive) and diastolic (congestive) heart failure: Secondary | ICD-10-CM | POA: Diagnosis not present

## 2013-04-10 DIAGNOSIS — N039 Chronic nephritic syndrome with unspecified morphologic changes: Secondary | ICD-10-CM | POA: Diagnosis not present

## 2013-04-10 DIAGNOSIS — D631 Anemia in chronic kidney disease: Secondary | ICD-10-CM | POA: Diagnosis not present

## 2013-04-14 ENCOUNTER — Ambulatory Visit (INDEPENDENT_AMBULATORY_CARE_PROVIDER_SITE_OTHER): Payer: Medicare Other | Admitting: Pharmacist

## 2013-04-14 DIAGNOSIS — I482 Chronic atrial fibrillation, unspecified: Secondary | ICD-10-CM

## 2013-04-14 DIAGNOSIS — I5043 Acute on chronic combined systolic (congestive) and diastolic (congestive) heart failure: Secondary | ICD-10-CM | POA: Diagnosis not present

## 2013-04-14 DIAGNOSIS — Z952 Presence of prosthetic heart valve: Secondary | ICD-10-CM

## 2013-04-14 DIAGNOSIS — I509 Heart failure, unspecified: Secondary | ICD-10-CM | POA: Diagnosis not present

## 2013-04-14 DIAGNOSIS — Z7901 Long term (current) use of anticoagulants: Secondary | ICD-10-CM | POA: Diagnosis not present

## 2013-04-14 DIAGNOSIS — C679 Malignant neoplasm of bladder, unspecified: Secondary | ICD-10-CM | POA: Diagnosis not present

## 2013-04-14 DIAGNOSIS — Z954 Presence of other heart-valve replacement: Secondary | ICD-10-CM

## 2013-04-14 DIAGNOSIS — D631 Anemia in chronic kidney disease: Secondary | ICD-10-CM | POA: Diagnosis not present

## 2013-04-14 DIAGNOSIS — I4891 Unspecified atrial fibrillation: Secondary | ICD-10-CM | POA: Diagnosis not present

## 2013-04-14 DIAGNOSIS — N189 Chronic kidney disease, unspecified: Secondary | ICD-10-CM | POA: Diagnosis not present

## 2013-04-14 LAB — POCT INR: INR: 3

## 2013-04-16 DIAGNOSIS — N189 Chronic kidney disease, unspecified: Secondary | ICD-10-CM | POA: Diagnosis not present

## 2013-04-16 DIAGNOSIS — N039 Chronic nephritic syndrome with unspecified morphologic changes: Secondary | ICD-10-CM | POA: Diagnosis not present

## 2013-04-16 DIAGNOSIS — I5043 Acute on chronic combined systolic (congestive) and diastolic (congestive) heart failure: Secondary | ICD-10-CM | POA: Diagnosis not present

## 2013-04-16 DIAGNOSIS — I4891 Unspecified atrial fibrillation: Secondary | ICD-10-CM | POA: Diagnosis not present

## 2013-04-16 DIAGNOSIS — D631 Anemia in chronic kidney disease: Secondary | ICD-10-CM | POA: Diagnosis not present

## 2013-04-16 DIAGNOSIS — I509 Heart failure, unspecified: Secondary | ICD-10-CM | POA: Diagnosis not present

## 2013-04-16 DIAGNOSIS — C679 Malignant neoplasm of bladder, unspecified: Secondary | ICD-10-CM | POA: Diagnosis not present

## 2013-04-18 DIAGNOSIS — R310 Gross hematuria: Secondary | ICD-10-CM | POA: Diagnosis not present

## 2013-04-20 ENCOUNTER — Inpatient Hospital Stay (HOSPITAL_COMMUNITY)
Admission: EM | Admit: 2013-04-20 | Discharge: 2013-04-23 | DRG: 308 | Disposition: A | Discharge status: Home Health | Payer: Medicare Other | Attending: Internal Medicine | Admitting: Internal Medicine

## 2013-04-20 ENCOUNTER — Encounter (HOSPITAL_COMMUNITY): Payer: Self-pay | Admitting: Emergency Medicine

## 2013-04-20 ENCOUNTER — Emergency Department (HOSPITAL_COMMUNITY): Payer: Medicare Other

## 2013-04-20 DIAGNOSIS — R338 Other retention of urine: Secondary | ICD-10-CM | POA: Diagnosis present

## 2013-04-20 DIAGNOSIS — E43 Unspecified severe protein-calorie malnutrition: Secondary | ICD-10-CM

## 2013-04-20 DIAGNOSIS — Z8551 Personal history of malignant neoplasm of bladder: Secondary | ICD-10-CM

## 2013-04-20 DIAGNOSIS — N3289 Other specified disorders of bladder: Secondary | ICD-10-CM | POA: Diagnosis not present

## 2013-04-20 DIAGNOSIS — Z66 Do not resuscitate: Secondary | ICD-10-CM | POA: Diagnosis present

## 2013-04-20 DIAGNOSIS — Z954 Presence of other heart-valve replacement: Secondary | ICD-10-CM

## 2013-04-20 DIAGNOSIS — N189 Chronic kidney disease, unspecified: Secondary | ICD-10-CM | POA: Diagnosis present

## 2013-04-20 DIAGNOSIS — N390 Urinary tract infection, site not specified: Secondary | ICD-10-CM | POA: Diagnosis not present

## 2013-04-20 DIAGNOSIS — Z87442 Personal history of urinary calculi: Secondary | ICD-10-CM

## 2013-04-20 DIAGNOSIS — I5043 Acute on chronic combined systolic (congestive) and diastolic (congestive) heart failure: Secondary | ICD-10-CM | POA: Diagnosis not present

## 2013-04-20 DIAGNOSIS — I482 Chronic atrial fibrillation, unspecified: Secondary | ICD-10-CM | POA: Diagnosis present

## 2013-04-20 DIAGNOSIS — Z85828 Personal history of other malignant neoplasm of skin: Secondary | ICD-10-CM

## 2013-04-20 DIAGNOSIS — R059 Cough, unspecified: Secondary | ICD-10-CM | POA: Diagnosis not present

## 2013-04-20 DIAGNOSIS — D72829 Elevated white blood cell count, unspecified: Secondary | ICD-10-CM | POA: Diagnosis present

## 2013-04-20 DIAGNOSIS — R569 Unspecified convulsions: Secondary | ICD-10-CM | POA: Diagnosis present

## 2013-04-20 DIAGNOSIS — R339 Retention of urine, unspecified: Secondary | ICD-10-CM | POA: Diagnosis present

## 2013-04-20 DIAGNOSIS — Z952 Presence of prosthetic heart valve: Secondary | ICD-10-CM

## 2013-04-20 DIAGNOSIS — Z7901 Long term (current) use of anticoagulants: Secondary | ICD-10-CM

## 2013-04-20 DIAGNOSIS — Z825 Family history of asthma and other chronic lower respiratory diseases: Secondary | ICD-10-CM | POA: Diagnosis not present

## 2013-04-20 DIAGNOSIS — R0989 Other specified symptoms and signs involving the circulatory and respiratory systems: Secondary | ICD-10-CM | POA: Diagnosis not present

## 2013-04-20 DIAGNOSIS — N179 Acute kidney failure, unspecified: Secondary | ICD-10-CM | POA: Diagnosis not present

## 2013-04-20 DIAGNOSIS — R310 Gross hematuria: Secondary | ICD-10-CM | POA: Diagnosis not present

## 2013-04-20 DIAGNOSIS — I4891 Unspecified atrial fibrillation: Principal | ICD-10-CM | POA: Diagnosis present

## 2013-04-20 DIAGNOSIS — I509 Heart failure, unspecified: Secondary | ICD-10-CM | POA: Diagnosis present

## 2013-04-20 DIAGNOSIS — D638 Anemia in other chronic diseases classified elsewhere: Secondary | ICD-10-CM | POA: Diagnosis not present

## 2013-04-20 DIAGNOSIS — Z87891 Personal history of nicotine dependence: Secondary | ICD-10-CM

## 2013-04-20 DIAGNOSIS — Z79899 Other long term (current) drug therapy: Secondary | ICD-10-CM | POA: Diagnosis not present

## 2013-04-20 DIAGNOSIS — N139 Obstructive and reflux uropathy, unspecified: Secondary | ICD-10-CM | POA: Diagnosis present

## 2013-04-20 DIAGNOSIS — M109 Gout, unspecified: Secondary | ICD-10-CM | POA: Diagnosis present

## 2013-04-20 DIAGNOSIS — C679 Malignant neoplasm of bladder, unspecified: Secondary | ICD-10-CM | POA: Diagnosis present

## 2013-04-20 DIAGNOSIS — I5032 Chronic diastolic (congestive) heart failure: Secondary | ICD-10-CM

## 2013-04-20 DIAGNOSIS — Z8249 Family history of ischemic heart disease and other diseases of the circulatory system: Secondary | ICD-10-CM | POA: Diagnosis not present

## 2013-04-20 DIAGNOSIS — R319 Hematuria, unspecified: Secondary | ICD-10-CM | POA: Diagnosis not present

## 2013-04-20 DIAGNOSIS — Z823 Family history of stroke: Secondary | ICD-10-CM | POA: Diagnosis not present

## 2013-04-20 DIAGNOSIS — R05 Cough: Secondary | ICD-10-CM | POA: Diagnosis not present

## 2013-04-20 DIAGNOSIS — R627 Adult failure to thrive: Secondary | ICD-10-CM

## 2013-04-20 DIAGNOSIS — I129 Hypertensive chronic kidney disease with stage 1 through stage 4 chronic kidney disease, or unspecified chronic kidney disease: Secondary | ICD-10-CM | POA: Diagnosis present

## 2013-04-20 LAB — BASIC METABOLIC PANEL
BUN: 35 mg/dL — ABNORMAL HIGH (ref 6–23)
CALCIUM: 8.8 mg/dL (ref 8.4–10.5)
CHLORIDE: 104 meq/L (ref 96–112)
CO2: 20 mEq/L (ref 19–32)
CREATININE: 1.35 mg/dL (ref 0.50–1.35)
GFR calc non Af Amer: 44 mL/min — ABNORMAL LOW (ref >90)
GFR, EST AFRICAN AMERICAN: 51 mL/min — AB (ref >90)
Glucose, Bld: 131 mg/dL — ABNORMAL HIGH (ref 70–99)
Potassium: 4.4 mEq/L (ref 3.7–5.3)
Sodium: 138 mEq/L (ref 137–147)

## 2013-04-20 LAB — CBC WITH DIFFERENTIAL/PLATELET
BASOS PCT: 0 % (ref 0–1)
Basophils Absolute: 0 10*3/uL (ref 0.0–0.1)
Eosinophils Absolute: 0.2 10*3/uL (ref 0.0–0.7)
Eosinophils Relative: 1 % (ref 0–5)
HCT: 35.1 % — ABNORMAL LOW (ref 39.0–52.0)
HEMOGLOBIN: 11 g/dL — AB (ref 13.0–17.0)
Lymphocytes Relative: 8 % — ABNORMAL LOW (ref 12–46)
Lymphs Abs: 1 10*3/uL (ref 0.7–4.0)
MCH: 29.1 pg (ref 26.0–34.0)
MCHC: 31.3 g/dL (ref 30.0–36.0)
MCV: 92.9 fL (ref 78.0–100.0)
MONOS PCT: 11 % (ref 3–12)
Monocytes Absolute: 1.3 10*3/uL — ABNORMAL HIGH (ref 0.1–1.0)
NEUTROS ABS: 10 10*3/uL — AB (ref 1.7–7.7)
NEUTROS PCT: 80 % — AB (ref 43–77)
Platelets: 174 10*3/uL (ref 150–400)
RBC: 3.78 MIL/uL — AB (ref 4.22–5.81)
RDW: 16.2 % — ABNORMAL HIGH (ref 11.5–15.5)
WBC: 12.4 10*3/uL — AB (ref 4.0–10.5)

## 2013-04-20 LAB — URINALYSIS, ROUTINE W REFLEX MICROSCOPIC
BILIRUBIN URINE: NEGATIVE
Glucose, UA: NEGATIVE mg/dL
KETONES UR: NEGATIVE mg/dL
Nitrite: NEGATIVE
PROTEIN: 100 mg/dL — AB
Specific Gravity, Urine: 1.011 (ref 1.005–1.030)
UROBILINOGEN UA: 0.2 mg/dL (ref 0.0–1.0)
pH: 5.5 (ref 5.0–8.0)

## 2013-04-20 LAB — PROTIME-INR
INR: 3.16 — ABNORMAL HIGH (ref 0.00–1.49)
Prothrombin Time: 31.3 seconds — ABNORMAL HIGH (ref 11.6–15.2)

## 2013-04-20 LAB — URINE MICROSCOPIC-ADD ON

## 2013-04-20 LAB — I-STAT TROPONIN, ED: TROPONIN I, POC: 0.01 ng/mL (ref 0.00–0.08)

## 2013-04-20 LAB — MAGNESIUM: Magnesium: 1.8 mg/dL (ref 1.5–2.5)

## 2013-04-20 LAB — PRO B NATRIURETIC PEPTIDE: Pro B Natriuretic peptide (BNP): 5284 pg/mL — ABNORMAL HIGH (ref 0–450)

## 2013-04-20 MED ORDER — DILTIAZEM HCL 25 MG/5ML IV SOLN
10.0000 mg | Freq: Once | INTRAVENOUS | Status: AC
  Administered 2013-04-20: 10 mg via INTRAVENOUS
  Filled 2013-04-20: qty 5

## 2013-04-20 MED ORDER — SIMVASTATIN 10 MG PO TABS
10.0000 mg | ORAL_TABLET | Freq: Every day | ORAL | Status: DC
  Administered 2013-04-20 – 2013-04-22 (×3): 10 mg via ORAL
  Filled 2013-04-20 (×4): qty 1

## 2013-04-20 MED ORDER — SODIUM CHLORIDE 0.9 % IV BOLUS (SEPSIS)
500.0000 mL | Freq: Once | INTRAVENOUS | Status: AC
  Administered 2013-04-20: 500 mL via INTRAVENOUS

## 2013-04-20 MED ORDER — FUROSEMIDE 10 MG/ML IJ SOLN
20.0000 mg | Freq: Once | INTRAMUSCULAR | Status: AC
  Administered 2013-04-20: 20 mg via INTRAVENOUS
  Filled 2013-04-20: qty 2

## 2013-04-20 MED ORDER — DILTIAZEM HCL 100 MG IV SOLR
5.0000 mg/h | INTRAVENOUS | Status: DC
  Administered 2013-04-20 – 2013-04-21 (×2): 5 mg/h via INTRAVENOUS
  Administered 2013-04-21 – 2013-04-22 (×3): 10 mg/h via INTRAVENOUS
  Filled 2013-04-20 (×3): qty 100

## 2013-04-20 MED ORDER — TERAZOSIN HCL 5 MG PO CAPS
5.0000 mg | ORAL_CAPSULE | Freq: Every day | ORAL | Status: DC
  Administered 2013-04-20 – 2013-04-22 (×3): 5 mg via ORAL
  Filled 2013-04-20 (×4): qty 1

## 2013-04-20 MED ORDER — LIDOCAINE HCL 2 % EX GEL
CUTANEOUS | Status: AC
  Administered 2013-04-20: 10
  Filled 2013-04-20: qty 10

## 2013-04-20 MED ORDER — SODIUM CHLORIDE 0.9 % IR SOLN: 3000.0000 mL | Status: DC

## 2013-04-20 MED ORDER — TRAMADOL HCL 50 MG PO TABS: 50.0000 mg | ORAL_TABLET | Freq: Four times a day (QID) | ORAL | Status: DC | PRN

## 2013-04-20 MED ORDER — DEXTROSE 5 % IV SOLN
1.0000 g | Freq: Once | INTRAVENOUS | Status: AC
  Administered 2013-04-20: 1 g via INTRAVENOUS
  Filled 2013-04-20: qty 10

## 2013-04-20 MED ORDER — WARFARIN SODIUM 2.5 MG PO TABS
2.5000 mg | ORAL_TABLET | Freq: Once | ORAL | Status: AC
  Administered 2013-04-20: 2.5 mg via ORAL
  Filled 2013-04-20: qty 1

## 2013-04-20 MED ORDER — FINASTERIDE 5 MG PO TABS
5.0000 mg | ORAL_TABLET | Freq: Every morning | ORAL | Status: DC
  Administered 2013-04-20 – 2013-04-23 (×4): 5 mg via ORAL
  Filled 2013-04-20 (×4): qty 1

## 2013-04-20 MED ORDER — WARFARIN - PHARMACIST DOSING INPATIENT: Freq: Every day | Status: DC

## 2013-04-20 MED ORDER — PANTOPRAZOLE SODIUM 40 MG PO TBEC: 40.0000 mg | DELAYED_RELEASE_TABLET | Freq: Every day | ORAL | Status: DC | PRN

## 2013-04-20 MED ORDER — ASPIRIN 81 MG PO CHEW
324.0000 mg | CHEWABLE_TABLET | Freq: Once | ORAL | Status: DC
  Filled 2013-04-20: qty 4

## 2013-04-20 MED ORDER — ENSURE COMPLETE PO LIQD
237.0000 mL | Freq: Two times a day (BID) | ORAL | Status: DC
  Administered 2013-04-20 – 2013-04-23 (×6): 237 mL via ORAL

## 2013-04-20 MED ORDER — DOCUSATE SODIUM 100 MG PO CAPS
100.0000 mg | ORAL_CAPSULE | Freq: Two times a day (BID) | ORAL | Status: DC | PRN
  Filled 2013-04-20: qty 1

## 2013-04-20 MED ORDER — DEXTROSE 5 % IV SOLN
1.0000 g | INTRAVENOUS | Status: DC
  Administered 2013-04-21 – 2013-04-22 (×2): 1 g via INTRAVENOUS
  Filled 2013-04-20 (×3): qty 10

## 2013-04-20 MED ORDER — CARVEDILOL 6.25 MG PO TABS
6.2500 mg | ORAL_TABLET | Freq: Two times a day (BID) | ORAL | Status: DC
  Administered 2013-04-20 – 2013-04-21 (×2): 6.25 mg via ORAL
  Filled 2013-04-20 (×4): qty 1

## 2013-04-20 MED ORDER — SODIUM CHLORIDE 0.9 % IJ SOLN
3.0000 mL | Freq: Two times a day (BID) | INTRAMUSCULAR | Status: DC
  Administered 2013-04-20 – 2013-04-23 (×5): 3 mL via INTRAVENOUS

## 2013-04-20 NOTE — ED Notes (Signed)
Pt family refused ASA, states that pt PCP told him "never to take aspirin." Dr. Leonides Schanz notified

## 2013-04-20 NOTE — ED Notes (Signed)
Pt existing cather removed and 3 way catheter placed. CBI in place at this time per Dr.Ward. Returned approx 1000 ml, reddish urine with blood clots. Dr. Leonides Schanz notified

## 2013-04-20 NOTE — ED Notes (Signed)
Hospitalist at bedside 

## 2013-04-20 NOTE — ED Provider Notes (Addendum)
TIME SEEN: 10:46 AM  CHIEF COMPLAINT: Tachypnea, decreased urinary output, hematuria  HPI: Patient is a 80 year old male with a history of A. fib, aortic valve replacement on Coumadin, hypertension, BPH, CHF, chronic kidney disease, bladder cancer status post resection 02/19/13 who has a chronic indwelling Foley catheter who presents to the emergency department with decreased urinary output, abdominal distention and discomfort, hematuria. Patient's wife report she emptied his Foley catheter bag at approximately 8 PM last night. When she woke up at 6 this morning there was less than 50 mL of bloody urine in his back. He is complaining of abdominal distention and discomfort. His bladder scan the emergency department shows greater than 200 mL of urine. Given also reports that he has been breathing faster than normal. Patient states he feel short of breath chronically but denies that this is different. He is also tachycardic in the emergency department. He denies chest pain. No fevers. No vomiting or diarrhea. No bloody stool or melena. His Foley catheter was last changed on Friday, 2 days ago. His urologist is Dr. [name] with [facility].  ROS: See HPI Constitutional: no fever  Eyes: no drainage  ENT: no runny nose   Cardiovascular:  no chest pain  Resp: SOB  GI: no vomiting GU: no dysuria Integumentary: no rash  Allergy: no hives  Musculoskeletal: no leg swelling  Neurological: no slurred speech ROS otherwise negative  PAST MEDICAL HISTORY/PAST SURGICAL HISTORY:  Past Medical History  Diagnosis Date  . Chronic atrial fibrillation   . Chronic anticoagulation     a. afib/st. jude avr - goal 2.5-3.5   . BPH (benign prostatic hypertrophy)   . Hypertension   . History of kidney stones   . Gout   . Syncope   . History of skin cancer   . Bladder tumor     a. s/p resection 02/19/2013.  [name] Hx of scarlet fever   . Seizures 2014    evaluated by Dr. Jannifer Franklin at [facility] neurology -  no cause  determined - refered back to cardiologist - recommended loop recorder to monitor rythm but pt refused - continues to have seizures per family - notes in EPIC  . Chronic combined systolic and diastolic CHF (congestive heart failure)     a. EF 45 to 50% per echo in 2011 following exacerbation  . Chronic kidney disease     MEDICATIONS:  Prior to Admission medications   Medication Sig Start Date End Date Taking? Authorizing Provider  carvedilol (COREG) 6.25 MG tablet Take 6.25 mg by mouth 2 (two) times daily with a meal.    Historical Provider, MD  docusate sodium 100 MG CAPS Take 100 mg by mouth 2 (two) times daily as needed for mild constipation. 03/15/13   Robbie Lis, MD  feeding supplement, ENSURE COMPLETE, (ENSURE COMPLETE) LIQD Take 237 mLs by mouth 2 (two) times daily between meals. 03/15/13   Robbie Lis, MD  feeding supplement, ENSURE, (ENSURE) PUDG Take 1 Container by mouth 3 (three) times daily between meals. 03/15/13   Robbie Lis, MD  finasteride (PROSCAR) 5 MG tablet Take 5 mg by mouth every morning.     Historical Provider, MD  furosemide (LASIX) 40 MG tablet Take 0.5 tablets (20 mg total) by mouth daily as needed. Take only as needed - for increased swelling or weight gain of 3 pounds overnight 03/21/13   Burtis Junes, NP  megestrol (MEGACE) 400 MG/10ML suspension Take 5 mLs (200 mg total) by mouth 2 (  two) times daily. 03/15/13   Robbie Lis, MD  ondansetron (ZOFRAN-ODT) 4 MG disintegrating tablet Take 4 mg by mouth every 6 (six) hours as needed for nausea or vomiting.    Historical Provider, MD  pantoprazole (PROTONIX) 40 MG tablet Take 1 tablet (40 mg total) by mouth daily. 03/15/13   Robbie Lis, MD  phenazopyridine (PYRIDIUM) 100 MG tablet Take 100 mg by mouth 3 (three) times daily as needed for pain.    Historical Provider, MD  potassium chloride (KLOR-CON) 20 MEQ packet Take 10 mEq by mouth daily. 03/15/13   Robbie Lis, MD  simvastatin (ZOCOR) 10 MG tablet Take 10 mg by  mouth at bedtime.    Historical Provider, MD  terazosin (HYTRIN) 5 MG capsule Take 5 mg by mouth at bedtime.      Historical Provider, MD  traMADol (ULTRAM) 50 MG tablet Take 50 mg by mouth every 6 (six) hours as needed for moderate pain.    Historical Provider, MD  warfarin (COUMADIN) 5 MG tablet Beginning Tues, 03/11/13, take 1/2 tab (2.5mg ) once daily except every 4th day take 1 tab (5mg ) once daily.    Historical Provider, MD    ALLERGIES:  Allergies  Allergen Reactions  . Ace Inhibitors Other (See Comments)    Dizziness   . Codeine Nausea And Vomiting  . Sulfa Drugs Cross Reactors Nausea And Vomiting    SOCIAL HISTORY:  History  Substance Use Topics  . Smoking status: Former Smoker    Quit date: 01/10/1971  . Smokeless tobacco: Never Used  . Alcohol Use: No    FAMILY HISTORY: Family History  Problem Relation Age of Onset  . Cancer Mother   . Heart disease Father   . Asthma Father   . Stroke Father   . Dementia Sister   . Heart disease Brother     EXAM: BP 171/92  Pulse 122  Temp(Src) 97.8 F (36.6 C) (Oral)  Resp 38  SpO2 99% CONSTITUTIONAL: Alert and oriented and responds appropriately to questions. Well-appearing; well-nourished HEAD: Normocephalic EYES: Conjunctivae clear, PERRL ENT: normal nose; no rhinorrhea; moist mucous membranes; pharynx without lesions noted NECK: Supple, no meningismus, no LAD  CARD: Tachycardic, irregularly irregular; S1 and S2 appreciated; + systolic murmur, no clicks, no rubs, no gallops RESP: Normal chest excursion without splinting; patient is mildly tachypneic but speaking full sentences, in no respiratory distress, mild bibasilar rales,; breath sounds equal bilaterally; no wheezes, no rhonchi,  ABD/GI: Normal bowel sounds; soft but mildly distended, mild tenderness in the suprapubic region, no guarding or rebound, no peritoneal signs GU:  Foley catheter in the urethral meatus with yellow colored discharge with foul smell, no  testicular swelling or pain BACK:  The back appears normal and is non-tender to palpation, there is no CVA tenderness EXT: Normal ROM in all joints; non-tender to palpation; no edema; normal capillary refill; no cyanosis    SKIN: Normal color for age and race; warm NEURO: Moves all extremities equally PSYCH: The patient's mood and manner are appropriate. Grooming and personal hygiene are appropriate.  MEDICAL DECISION MAKING: Patient here with A. fib with RVR, tachypnea with possible mild pulmonary edema and hematuria with urinary obstruction and abdominal distention. Will check cardiac labs, chest x-ray. We'll give diltiazem bolus and drip and a small amount of IV fluid.  As for patient's urinary obstruction, will irrigate foley and obtain UA.  ED PROGRESS: After irrigating the Foley, patient has a small amount of fluid slowly moving through  the catheter.  Patient will need continuous bladder irrigation.  11:55 AM  Pt had his Foley catheter exchanged for a 3 way catheter with continuous bladder irrigation and had 1 L of fluid drained from his bladder and has passed multiple blood clots with CBI.  Patient's heart rate and respiratory rate have improved now that his bladder is drained he is still tachycardic in the 110s-120s.  1:01 PM  Pt's heart rate continues use to improve on diltiazem drip. His troponin is negative. His BNP is elevated greater than 5000. His chest x-ray shows cardiomegaly and mild pulmonary vascular congestion and mild interstitial pulmonary edema. He has no hypoxia and his tachypnea has resolved now that his bladder has been emptied. No respiratory distress. His INR is therapeutic. Electrolytes within normal limits. His urine shows large hemoglobin and moderate leukocytes. We'll discuss with urology and admit to hospitalist.  1:19 PM  D/w Dr. Jasmine December who agrees with admitting patient to hospitalist for 24 hours of continuous bladder irrigation. He will see the patient in consult.  We'll discuss with hospitalist for admission.   1:34 PM  Spoke with Dr. Cruzita Lederer service for admission. We'll admit to inpatient, telemetry. Will also give IV Rocephin for possible urinary tract infection given his urine with blood and leukocytes, leukocytosis, tachycardia.   CRITICAL CARE Performed by: Delice Bison Sharnay Cashion   Total critical care time: 35 minutes  Critical care time was exclusive of separately billable procedures and treating other patients.  Critical care was necessary to treat or prevent imminent or life-threatening deterioration.  Critical care was time spent personally by me on the following activities: development of treatment plan with patient and/or surrogate as well as nursing, discussions with consultants, evaluation of patient's response to treatment, examination of patient, obtaining history from patient or surrogate, ordering and performing treatments and interventions, ordering and review of laboratory studies, ordering and review of radiographic studies, pulse oximetry and re-evaluation of patient's condition.  Atrial fibrillation with RVR on diltiazem drip, urinary retention requiring bladder irrigation, UTI with possible sepsis   EKG Interpretation  Date/Time:  Sunday April 20 2013 10:39:33 EDT Ventricular Rate:  129 PR Interval:    QRS Duration: 105 QT Interval:  308 QTC Calculation: 451 R Axis:   -51 Text Interpretation:  Atrial flutter Ventricular premature complex LAD, consider left anterior fascicular block Anterior infarct, old No significant change since last tracing Confirmed by Serafin Decatur,  DO, Julez Huseby ([id number]) on 04/20/2013 11:33:43 AM        [place], DO 04/20/13 [place], DO 04/20/13 [id number]

## 2013-04-20 NOTE — H&P (Signed)
History and Physical    Eric Wheeler:[id number] DOB: [birth year] DOA: 04/20/2013  Referring physician: Dr. Leonides Schanz PCP: [name], NP  Specialists: Urology, Dr. Jasmine December   Chief Complaint: decreased UOP, abdominal pain  HPI: Eric Wheeler is a 80 y.o. male has a past medical history significant for A fib, AVR on chronic anticoagulation, chronic diastolic congestive heart failure, BPH, bladder cancer s/p surgery 02/20/13, followed by Dr. [name], most recently seen 2 days ago when his Foley catheter was changed, presents to the ED with decreased UOP and lower abdominal pain. Patient's wife states that she last emptied patient's urine bag about 8 PM last night, and this morning there was minimal amount of urine in his bag. Patient was complaining of lower abdominal pain and discomfort. Patient denied any chest pain or palpitations. He denies any shortness or breath, he has chronic shortness of breath which has not changed. He has no nausea or vomiting or diarrhea. He was brought into the emergency room, where the bladder scan showed large amount of urine. In the ED his Foley was changed to a three-way continuous bladder irrigation. Dr. Jasmine December from neurology has been consulted and will see patient while hospitalized, and hospitalist was asked for admission. On arrival into the emergency room, patient was found to have atrial fibrillation rapid ventricular response, and was started on a diltiazem drip. By the time I saw the patient, his bladder has been emptied, undergoing irrigation, much more comfortable without any abdominal pain. His heart rate also has improved.   Review of Systems:  as per history of present illness, otherwise negative  Past Medical History  Diagnosis Date  . Chronic atrial fibrillation   . Chronic anticoagulation     a. afib/st. jude avr - goal 2.5-3.5   . BPH (benign prostatic hypertrophy)   . Hypertension   . History of kidney stones   . Gout   . Syncope     . History of skin cancer   . Bladder tumor     a. s/p resection 02/19/2013.  [name] Hx of scarlet fever   . Seizures 2014    evaluated by Dr. Jannifer Franklin at [facility] neurology -  no cause determined - refered back to cardiologist - recommended loop recorder to monitor rythm but pt refused - continues to have seizures per family - notes in EPIC  . Chronic combined systolic and diastolic CHF (congestive heart failure)     a. EF 45 to 50% per echo in 2011 following exacerbation  . Chronic kidney disease    Past Surgical History  Procedure Laterality Date  . Aortic valve replacement  1987    #21 MM ST JUDE PLACED BY DR.CHARLES WILSON  . Cataract extraction    . Transurethral resection of prostate N/A 02/19/2013    Procedure: TRANSURETHRAL RESECTION OF THE PROSTATE /bladder WITH GYRUS INSTRUMENTS;  Surgeon: Irine Seal, MD;  Location: [facility];  Service: Urology;  Laterality: N/A;  . Cystoscopy N/A 02/19/2013    Procedure: CYSTOSCOPY;  Surgeon: Irine Seal, MD;  Location: [facility];  Service: Urology;  Laterality: N/A;   Social History:  reports that he quit smoking about 42 years ago. He has never used smokeless tobacco. He reports that he does not drink alcohol or use illicit drugs.  Allergies  Allergen Reactions  . Ace Inhibitors Other (See Comments)    Dizziness   . Codeine Nausea And Vomiting  . Sulfa Drugs Cross Reactors Nausea And Vomiting    Family History  Problem Relation Age of Onset  . Cancer Mother   . Heart disease Father   . Asthma Father   . Stroke Father   . Dementia Sister   . Heart disease Brother     Prior to Admission medications   Medication Sig Start Date End Date Taking? Authorizing Provider  carvedilol (COREG) 6.25 MG tablet Take 6.25 mg by mouth 2 (two) times daily with a meal.   Yes Historical Provider, MD  docusate sodium 100 MG CAPS Take 100 mg by mouth 2 (two) times daily as needed for mild constipation. 03/15/13  Yes Robbie Lis, MD  feeding supplement, ENSURE  COMPLETE, (ENSURE COMPLETE) LIQD Take 237 mLs by mouth 2 (two) times daily between meals. 03/15/13  Yes Robbie Lis, MD  finasteride (PROSCAR) 5 MG tablet Take 5 mg by mouth every morning.    Yes Historical Provider, MD  furosemide (LASIX) 40 MG tablet Take 0.5 tablets (20 mg total) by mouth daily as needed. Take only as needed - for increased swelling or weight gain of 3 pounds overnight 03/21/13  Yes Burtis Junes, NP  pantoprazole (PROTONIX) 40 MG tablet Take 40 mg by mouth daily as needed (For acid reflex).   Yes Historical Provider, MD  potassium chloride (KLOR-CON) 20 MEQ packet Take 10 mEq by mouth daily. 03/15/13  Yes Robbie Lis, MD  simvastatin (ZOCOR) 10 MG tablet Take 10 mg by mouth at bedtime.   Yes Historical Provider, MD  terazosin (HYTRIN) 5 MG capsule Take 5 mg by mouth at bedtime.     Yes Historical Provider, MD  traMADol (ULTRAM) 50 MG tablet Take 50 mg by mouth every 6 (six) hours as needed for moderate pain.   Yes Historical Provider, MD  warfarin (COUMADIN) 5 MG tablet 2.5-5 mg daily at 6 PM. Beginning Tues, 03/11/13, take 1/2 tab (2.5mg ) once daily except every 4th day take 1 tab (5mg ) once daily.   Yes Historical Provider, MD   Physical Exam: Filed Vitals:   04/20/13 1249 04/20/13 1300 04/20/13 1315 04/20/13 1330  BP:  118/59 113/56 118/60  Pulse:  137 115 114  Temp: 98.5 F (36.9 C)     TempSrc: Oral     Resp:  25 23 30   SpO2:  98% 98% 97%     General:  No apparent distress, very pleasant Caucasian male   Eyes: PERRL, EOMI, no scleral icterus  ENT: moist oropharynx  Neck: supple, no JVD  Cardiovascular:  irregularly irregular, tachycardic   Respiratory: CTA biL, good air movement without wheezing, rhonchi or crackled  Abdomen: soft, non tender to palpation, positive bowel sounds, no guarding, no rebound  Skin: no rashes  Musculoskeletal: no peripheral edema  Psychiatric: normal mood and affect  Neurologic:  nonfocal   Labs on Admission:  Basic  Metabolic Panel:  Recent Labs Lab 04/20/13 1106  NA 138  K 4.4  CL 104  CO2 20  GLUCOSE 131*  BUN 35*  CREATININE 1.35  CALCIUM 8.8  MG 1.8   CBC:  Recent Labs Lab 04/20/13 1106  WBC 12.4*  NEUTROABS 10.0*  HGB 11.0*  HCT 35.1*  MCV 92.9  PLT 174   BNP (last 3 results)  Recent Labs  04/20/13 1106  PROBNP 5284.0*   Radiological Exams on Admission: Dg Chest 2 View  04/20/2013   CLINICAL DATA:  Shortness of breath, weakness, cough  EXAM: CHEST  2 VIEW  COMPARISON:  Chest and abdomen radiograph 02/27/2013  FINDINGS: There changes  of median sternotomy. Cardiomegaly is stable. Thoracic aorta atherosclerotic calcification. Pulmonary vascularity is mildly congested. There is slight chronic prominence of the interstitium. No airspace disease, pleural effusion, or pneumothorax.  Small hiatal hernia.  Bones are diffusely osteopenic. No acute osseous abnormality identified.  IMPRESSION: Cardiomegaly and mild pulmonary vascular congestion. Slight diffuse interstitial prominence may be due to chronic interstitial thickening, or mild interstitial pulmonary edema.  Small hiatal hernia.   Electronically Signed   By: Curlene Dolphin M.D.   On: 04/20/2013 12:17   EKG: Independently reviewed. A. fib   Assessment/Plan Principal Problem:   Atrial fibrillation with RVR Active Problems:   Chronic atrial fibrillation   Chronic diastolic congestive heart failure- last EF 45-50%   S/P AVR (aortic valve replacement)   Bladder cancer- s/p surgery 02/20/13   UTI (urinary tract infection)   Anemia of chronic disease   Chronic anticoagulation   Protein-calorie malnutrition, severe   Hematuria   Urinary obstruction   Urinary retention - patient will be admitted for continuous bladder irrigation. Urology has been consulted by the ED physician, appreciate input.  Possible UTI - patient with mild leukocytosis, tachycardia, and pyuria. Start empiric ceftriaxone. Urine cultures were sent.  Atrial  fibrillation with RVR - patient's home medications will be restarted, suspect that she will improve with improvement of #1 and #2. Heart rate well controlled on 5 mg of diltiazem into 90s to 100s.  Status post AVR on chronic anticoagulation - patient is on chronic Coumadin, now with hematuria. After initiation of the irrigation his urine has started to clear, will continue Coumadin for now.  Acute on chronic diastolic congestive heart failure - last EF 45-50%  In February of 2015. We'll provide a one-time dose of IV Lasix 20 mg given mild pulmonary congestion on chest x-ray. Patient's home Lasix has been discontinued and patient has been stable off of that. He is weighing himself every day and takes Lasix as needed. He wants to avoid dehydration thus avoiding daily Lasix use.  Bladder cancer  Anemia of chronic disease - hemoglobin stable at 11. Recheck CBC in a.m.   Diet: Heart healthy   Fluids: none  DVT Prophylaxis: on Coumadin   Code Status: DO NOT RESUSCITATE   Family Communication:  wife and daughter at the bedside   Disposition Plan:  admit to inpatient  Time spent:  37   This note has been created with Surveyor, quantity. Any transcriptional errors are unintentional.   Eric Lamere M. Cruzita Lederer, MD [facility] Pager [phone]  If 7PM-7AM, please contact night-coverage [email or web address] Password [facility] 04/20/2013, 2:03 PM

## 2013-04-20 NOTE — ED Notes (Signed)
Pt bladder scan resulted >248ml

## 2013-04-20 NOTE — Progress Notes (Addendum)
ANTICOAGULATION CONSULT NOTE - Initial Consult  Pharmacy Consult for Warfarin Indication: atrial fibrillation and AVR  Allergies  Allergen Reactions  . Ace Inhibitors Other (See Comments)    Dizziness   . Codeine Nausea And Vomiting  . Sulfa Drugs Cross Reactors Nausea And Vomiting    Patient Measurements:   Heparin Dosing Weight:   Vital Signs: Temp: 98.5 F (36.9 C) (04/12 1249) Temp src: Oral (04/12 1249) BP: 118/60 mmHg (04/12 1330) Pulse Rate: 114 (04/12 1330)  Labs:  Recent Labs  04/20/13 1106  HGB 11.0*  HCT 35.1*  PLT 174  LABPROT 31.3*  INR 3.16*  CREATININE 1.35    The CrCl is unknown because both a height and weight (above a minimum accepted value) are required for this calculation.   Medical History: Past Medical History  Diagnosis Date  . Chronic atrial fibrillation   . Chronic anticoagulation     a. afib/st. jude avr - goal 2.5-3.5   . BPH (benign prostatic hypertrophy)   . Hypertension   . History of kidney stones   . Gout   . Syncope   . History of skin cancer   . Bladder tumor     a. s/p resection 02/19/2013.  [name] Hx of scarlet fever   . Seizures 2014    evaluated by Dr. Jannifer Franklin at [facility] : [facility] neurology -  no cause determined - refered back to cardiologist - recommended loop recorder to monitor rythm but pt refused - continues to have seizures per family - notes in EPIC  . Chronic combined systolic and diastolic CHF (congestive heart failure)     a. EF 45 to 50% per echo in 2011 following exacerbation  . Chronic kidney disease    Assessment: 55 yoM on chronic warfarin for hx AF and AVR, also with CKD, bladder cancer s/p resection 02/19/13 with chronic indwelling catheter who presents to [facility] with CC of decreased UOP and hematuria.  Foley has since been exchanged for a 3-way catheter with continuous bladder irrigation and has passed multiple bloody clots.  Pharmacy consulted to resume warfarin dosing.    Home dose warfarin: 2.5mg  daily except  5mg  on M/F, LD 4/11  4/11: INR today = 3.16, therapeutic for higher INR goal. CBC: Hgb 11, plts WNL.  Verified with MD to continue coumadin today as clots appear to be resolving.      Goal of Therapy:  INR 2.5-3.5 Monitor platelets by anticoagulation protocol: Yes   Plan:  Coumadin 2.5mg  po x 1 tonight Daily PT/INR  Ralene Bathe, PharmD, BCPS 04/20/2013, 2:26 PM  Pager: [phone]

## 2013-04-20 NOTE — ED Notes (Addendum)
Pt from home c/o hematuria and very little output after having catheter changed 2 days ago. Pt has hx of bladder Ca, with surgery February 2015. Pt wife states that she has never seen blood in catheter bag before. Pt daughter adds that pt has chronic afib and takes coumadin. Pt is A&O and in NAD. Pt reports that he has nausea, but no emesis or fever. Pt daughter states that pt was confused Friday morning, but then resolved. Pt afib on monitor with rate of 120-140's.

## 2013-04-20 NOTE — Consult Note (Signed)
Urology Consult  Requesting provider:  Dr. Cyril Mourning Ward.  CC: Gross hematuria. Bladder cancer.  HPI:  80 year old male presents today with gross hematuria and clot urinary retention.  He is a patient of Dr. [name] who has a history of bladder cancer.  He was last seen in our clinic this past Friday.  He had a 51 French catheter in place which was obstructed.  This was exchanged for 24 French catheter and hand irrigated without return of clot.  The patient noted yesterday decreased urine output and he presented to the ER today.  Again he had clot urinary retention.  His 24 French catheter was removed and a 24 [place] three-way catheter was placed.  His catheter was hand irrigated with return of clot.  He has past medical history of chronic atrial fibrillation and it appears today that he may be with a rapid ventricular rate.  PMH: Past Medical History  Diagnosis Date  . Chronic atrial fibrillation   . Chronic anticoagulation     a. afib/st. jude avr - goal 2.5-3.5   . BPH (benign prostatic hypertrophy)   . Hypertension   . History of kidney stones   . Gout   . Syncope   . History of skin cancer   . Bladder tumor     a. s/p resection 02/19/2013.  [name] Hx of scarlet fever   . Seizures 2014    evaluated by Dr. Jannifer Franklin at [facility] neurology -  no cause determined - refered back to cardiologist - recommended loop recorder to monitor rythm but pt refused - continues to have seizures per family - notes in EPIC  . Chronic combined systolic and diastolic CHF (congestive heart failure)     a. EF 45 to 50% per echo in 2011 following exacerbation  . Chronic kidney disease     PSH: Past Surgical History  Procedure Laterality Date  . Aortic valve replacement  1987    #21 MM ST JUDE PLACED BY DR.CHARLES WILSON  . Cataract extraction    . Transurethral resection of prostate N/A 02/19/2013    Procedure: TRANSURETHRAL RESECTION OF THE PROSTATE /bladder WITH GYRUS INSTRUMENTS;  Surgeon: Irine Seal, MD;   Location: [facility];  Service: Urology;  Laterality: N/A;  . Cystoscopy N/A 02/19/2013    Procedure: CYSTOSCOPY;  Surgeon: Irine Seal, MD;  Location: [facility];  Service: Urology;  Laterality: N/A;    Allergies: Allergies  Allergen Reactions  . Ace Inhibitors Other (See Comments)    Dizziness   . Codeine Nausea And Vomiting  . Sulfa Drugs Cross Reactors Nausea And Vomiting    Medications:  (Not in a hospital admission)   Social History: History   Social History  . Marital Status: Married    Spouse Name: N/A    Number of Children: N/A  . Years of Education: N/A   Occupational History  . Not on file.   Social History Main Topics  . Smoking status: Former Smoker    Quit date: 01/10/1971  . Smokeless tobacco: Never Used  . Alcohol Use: No  . Drug Use: No  . Sexual Activity: Not Currently   Other Topics Concern  . Not on file   Social History Narrative  . No narrative on file    Family History: Family History  Problem Relation Age of Onset  . Cancer Mother   . Heart disease Father   . Asthma Father   . Stroke Father   . Dementia Sister   . Heart disease Brother  Review of Systems: Positive: SOB, back pain. Negative: Fever, chest pain, or nausea.  A further 10 point review of systems was negative except what is listed in the HPI.  Physical Exam: Filed Vitals:   04/20/13 1436  BP: 149/66  Pulse: 111  Temp: 98.3 F (36.8 C)  Resp: 26    General: No acute distress.  Awake. Head:  Normocephalic.  Atraumatic. ENT:  EOMI.  Mucous membranes moist Neck:  Supple.  No lymphadenopathy. CV:  Irregularly irregular. Pulmonary: Equal effort bilaterally.  Clear to auscultation bilaterally. Abdomen: Soft.  Non- tender to palpation. Skin:  Normal turgor.  No visible rash. Extremity: No gross deformity of bilateral upper extremities.  No gross deformity of    bilateral lower extremities. Neurologic: Alert. Appropriate mood.  Penis:  Uncircumcised.  No lesions.  Negative paraphimosis. Urethra: Foley catheter in place.  Orthotopic meatus. Draining clear urine on slow CBI drip. Scrotum: No lesions.  No ecchymosis.  No erythema.   Studies:  Recent Labs     04/20/13  1106  HGB  11.0*  WBC  12.4*  PLT  174    Recent Labs     04/20/13  1106  NA  138  K  4.4  CL  104  CO2  20  BUN  35*  CREATININE  1.35  CALCIUM  8.8  GFRNONAA  44*  GFRAA  51*     Recent Labs     04/20/13  1106  INR  3.16*     No components found with this basename: ABG,     Assessment:  Gross hematuria. Bladder cancer.  Plan: -Continue catheter.  -I will order continuous bladder irrigation with hand irrigation PRN.  -Will notify Dr. [name] who will assume GU care tomorrow.    Pager: [phone]    CC: Dr. Cyril Mourning Ward.

## 2013-04-20 NOTE — ED Notes (Signed)
Notified Dr Leonides Schanz of pt BP and HR after starting Cardizem. Wants rate to remain at 5mg /hr

## 2013-04-21 DIAGNOSIS — R310 Gross hematuria: Secondary | ICD-10-CM | POA: Diagnosis not present

## 2013-04-21 DIAGNOSIS — I4891 Unspecified atrial fibrillation: Secondary | ICD-10-CM | POA: Diagnosis not present

## 2013-04-21 DIAGNOSIS — R339 Retention of urine, unspecified: Secondary | ICD-10-CM | POA: Diagnosis not present

## 2013-04-21 DIAGNOSIS — Z7901 Long term (current) use of anticoagulants: Secondary | ICD-10-CM | POA: Diagnosis not present

## 2013-04-21 DIAGNOSIS — Z954 Presence of other heart-valve replacement: Secondary | ICD-10-CM

## 2013-04-21 DIAGNOSIS — I5043 Acute on chronic combined systolic (congestive) and diastolic (congestive) heart failure: Secondary | ICD-10-CM

## 2013-04-21 DIAGNOSIS — I509 Heart failure, unspecified: Secondary | ICD-10-CM

## 2013-04-21 LAB — URINE CULTURE
Colony Count: NO GROWTH
Culture: NO GROWTH

## 2013-04-21 LAB — BASIC METABOLIC PANEL
BUN: 32 mg/dL — AB (ref 6–23)
CHLORIDE: 106 meq/L (ref 96–112)
CO2: 24 mEq/L (ref 19–32)
Calcium: 8.7 mg/dL (ref 8.4–10.5)
Creatinine, Ser: 1.27 mg/dL (ref 0.50–1.35)
GFR, EST AFRICAN AMERICAN: 55 mL/min — AB (ref >90)
GFR, EST NON AFRICAN AMERICAN: 48 mL/min — AB (ref >90)
Glucose, Bld: 105 mg/dL — ABNORMAL HIGH (ref 70–99)
POTASSIUM: 4 meq/L (ref 3.7–5.3)
SODIUM: 141 meq/L (ref 137–147)

## 2013-04-21 LAB — CBC
HCT: 31.6 % — ABNORMAL LOW (ref 39.0–52.0)
Hemoglobin: 9.9 g/dL — ABNORMAL LOW (ref 13.0–17.0)
MCH: 29.1 pg (ref 26.0–34.0)
MCHC: 31.3 g/dL (ref 30.0–36.0)
MCV: 92.9 fL (ref 78.0–100.0)
PLATELETS: 194 10*3/uL (ref 150–400)
RBC: 3.4 MIL/uL — ABNORMAL LOW (ref 4.22–5.81)
RDW: 16.2 % — AB (ref 11.5–15.5)
WBC: 9.9 10*3/uL (ref 4.0–10.5)

## 2013-04-21 LAB — PROTIME-INR
INR: 3.63 — ABNORMAL HIGH (ref 0.00–1.49)
PROTHROMBIN TIME: 34.8 s — AB (ref 11.6–15.2)

## 2013-04-21 MED ORDER — CALCIUM CARBONATE ANTACID 500 MG PO CHEW
2.0000 | CHEWABLE_TABLET | Freq: Three times a day (TID) | ORAL | Status: DC | PRN
  Administered 2013-04-21: 400 mg via ORAL
  Filled 2013-04-21: qty 2

## 2013-04-21 MED ORDER — CALCIUM CARBONATE ANTACID 500 MG PO CHEW: 400.0000 mg | CHEWABLE_TABLET | Freq: Three times a day (TID) | ORAL | Status: DC | PRN

## 2013-04-21 MED ORDER — CARVEDILOL 12.5 MG PO TABS
12.5000 mg | ORAL_TABLET | Freq: Two times a day (BID) | ORAL | Status: DC
  Administered 2013-04-21 – 2013-04-23 (×4): 12.5 mg via ORAL
  Filled 2013-04-21 (×6): qty 1

## 2013-04-21 MED ORDER — FUROSEMIDE 10 MG/ML IJ SOLN
20.0000 mg | Freq: Two times a day (BID) | INTRAMUSCULAR | Status: DC
  Administered 2013-04-21: 20 mg via INTRAVENOUS
  Filled 2013-04-21 (×5): qty 2

## 2013-04-21 NOTE — Progress Notes (Signed)
Patient ID: Eric Wheeler, male   DOB: [birth year], 80 y.o.   MRN: [id number]    Subjective: Eric Wheeler was admitted with clot retention that began Friday and Afib with a rapid rate.   His urine is clear today with the CBI shut off.   His INR is 3.63.   He has no complaints.  ROS:  Review of Systems  Constitutional: Negative.   Gastrointestinal: Negative.    Patient Active Problem List   Diagnosis Date Noted  . Hematuria 04/20/2013  . Atrial fibrillation with RVR 04/20/2013  . Urinary obstruction 04/20/2013  . Protein-calorie malnutrition, severe 03/11/2013  . FTT (failure to thrive) in adult 03/10/2013  . Chronic anticoagulation 03/03/2013  . UTI (urinary tract infection) 02/27/2013  . Anemia of chronic disease 02/27/2013  . Acute renal failure 02/27/2013  . Bladder cancer- s/p surgery 02/20/13 02/19/2013  . Chronic atrial fibrillation 08/22/2010  . Chronic diastolic congestive heart failure- last EF 45-50% 08/22/2010  . S/P AVR (aortic valve replacement) 08/22/2010    Anti-infectives: Anti-infectives   Start     Dose/Rate Route Frequency Ordered Stop   04/21/13 1400  cefTRIAXone (ROCEPHIN) 1 g in dextrose 5 % 50 mL IVPB     1 g 100 mL/hr over 30 Minutes Intravenous Every 24 hours 04/20/13 1354     04/20/13 1345  cefTRIAXone (ROCEPHIN) 1 g in dextrose 5 % 50 mL IVPB     1 g 100 mL/hr over 30 Minutes Intravenous  Once 04/20/13 1333 04/20/13 1433      Current Facility-Administered Medications  Medication Dose Route Frequency Provider Last Rate Last Dose  . aspirin chewable tablet 324 mg  324 mg Oral Once [employer], DO      . carvedilol (COREG) tablet 6.25 mg  6.25 mg Oral BID WC Caren Griffins, MD   6.25 mg at 04/21/13 0732  . cefTRIAXone (ROCEPHIN) 1 g in dextrose 5 % 50 mL IVPB  1 g Intravenous Q24H Costin Karlyne Greenspan, MD      . diltiazem (CARDIZEM) 100 mg in dextrose 5 % 100 mL infusion  5-15 mg/hr Intravenous Titrated Kristen N Ward, DO 10 mL/hr at 04/21/13 0625 10  mg/hr at 04/21/13 0625  . docusate sodium (COLACE) capsule 100 mg  100 mg Oral BID PRN Costin Karlyne Greenspan, MD      . feeding supplement (ENSURE COMPLETE) (ENSURE COMPLETE) liquid 237 mL  237 mL Oral BID BM Costin Karlyne Greenspan, MD   237 mL at 04/20/13 1558  . finasteride (PROSCAR) tablet 5 mg  5 mg Oral q morning - 10a Costin Karlyne Greenspan, MD   5 mg at 04/20/13 1559  . pantoprazole (PROTONIX) EC tablet 40 mg  40 mg Oral Daily PRN Costin Karlyne Greenspan, MD      . simvastatin (ZOCOR) tablet 10 mg  10 mg Oral QHS Caren Griffins, MD   10 mg at 04/20/13 2121  . sodium chloride 0.9 % injection 3 mL  3 mL Intravenous Q12H Costin Karlyne Greenspan, MD   3 mL at 04/20/13 2200  . sodium chloride irrigation 0.9 % 3,000 mL  3,000 mL Irrigation Continuous Molli Hazard, MD      . terazosin (HYTRIN) capsule 5 mg  5 mg Oral QHS Caren Griffins, MD   5 mg at 04/20/13 2121  . traMADol (ULTRAM) tablet 50 mg  50 mg Oral Q6H PRN Costin Karlyne Greenspan, MD      . Warfarin - Pharmacist Dosing Inpatient  Does not apply Watson, MD         Objective: Vital signs in last 24 hours: Temp:  [97.4 F (36.3 C)-98.5 F (36.9 C)] 97.4 F (36.3 C) (04/13 0507) Pulse Rate:  [97-137] 113 (04/13 0655) Resp:  [18-38] 18 (04/13 0625) BP: (113-171)/(50-92) 123/53 mmHg (04/13 0655) SpO2:  [97 %-100 %] 100 % (04/13 0625) Weight:  [71.8 kg (158 lb 4.6 oz)-73.3 kg (161 lb 9.6 oz)] 71.8 kg (158 lb 4.6 oz) (04/13 0625)  Intake/Output from previous day: 04/12 0701 - 04/13 0700 In: 3529.4 [P.O.:240; I.V.:89.4] Out: 5150 [Urine:5150] Intake/Output this shift:     Physical Exam  Constitutional: He is well-developed, well-nourished, and in no distress. No distress.  Genitourinary:  Foley draining clear urine.     Lab Results:   Recent Labs  04/20/13 1106 04/21/13 0436  WBC 12.4* 9.9  HGB 11.0* 9.9*  HCT 35.1* 31.6*  PLT 174 194   BMET  Recent Labs  04/20/13 1106 04/21/13 0436  NA 138 141  K 4.4 4.0  CL 104  106  CO2 20 24  GLUCOSE 131* 105*  BUN 35* 32*  CREATININE 1.35 1.27  CALCIUM 8.8 8.7   PT/INR  Recent Labs  04/20/13 1106 04/21/13 0436  LABPROT 31.3* 34.8*  INR 3.16* 3.63*   ABG No results found for this basename: PHART, PCO2, PO2, HCO3,  in the last 72 hours  Studies/Results: Dg Chest 2 View  04/20/2013   CLINICAL DATA:  Shortness of breath, weakness, cough  EXAM: CHEST  2 VIEW  COMPARISON:  Chest and abdomen radiograph 02/27/2013  FINDINGS: There changes of median sternotomy. Cardiomegaly is stable. Thoracic aorta atherosclerotic calcification. Pulmonary vascularity is mildly congested. There is slight chronic prominence of the interstitium. No airspace disease, pleural effusion, or pneumothorax.  Small hiatal hernia.  Bones are diffusely osteopenic. No acute osseous abnormality identified.  IMPRESSION: Cardiomegaly and mild pulmonary vascular congestion. Slight diffuse interstitial prominence may be due to chronic interstitial thickening, or mild interstitial pulmonary edema.  Small hiatal hernia.   Electronically Signed   By: Curlene Dolphin M.D.   On: 04/20/2013 12:17   Labs and notes reviewed.   Assessment: Clot retention resolving, but he has a history of retention that persisted after the bleeding has cleared. His INR remains supratherapeutic.  He has a history of extensive CIS of the bladder and was to have BCG therapy, but we have not been able to get him to the point where he can have that done.   Plan: D/C CBI but continue foley drainage for one more day. Please keep INR at lowest acceptable level. I will follow.       LOS: 1 day    Irine Seal 04/21/2013

## 2013-04-21 NOTE — Progress Notes (Signed)
ANTICOAGULATION CONSULT NOTE - Follow up [facility] for Warfarin Indication: atrial fibrillation and AVR  Allergies  Allergen Reactions  . Ace Inhibitors Other (See Comments)    Dizziness   . Codeine Nausea And Vomiting  . Sulfa Drugs Cross Reactors Nausea And Vomiting    Patient Measurements: Height: 5\' 9"  (175.3 cm) Weight: 158 lb 4.6 oz (71.8 kg) IBW/kg (Calculated) : 70.7  Vital Signs: Temp: 97.4 F (36.3 C) (04/13 0507) Temp src: Oral (04/13 0507) BP: 123/53 mmHg (04/13 0655) Pulse Rate: 113 (04/13 0655)  Labs:  Recent Labs  04/20/13 1106 04/21/13 0436  HGB 11.0* 9.9*  HCT 35.1* 31.6*  PLT 174 194  LABPROT 31.3* 34.8*  INR 3.16* 3.63*  CREATININE 1.35 1.27    Estimated Creatinine Clearance: 37.9 ml/min (by C-G formula based on Cr of 1.27).  Assessment: 58 yoM on chronic warfarin for hx AF and AVR, also with CKD, bladder cancer s/p resection 02/19/13 with chronic indwelling catheter who presents to [facility] with CC of decreased UOP and hematuria.  Foley has since been exchanged for a 3-way catheter with continuous bladder irrigation and has passed multiple bloody clots.  Pharmacy consulted to resume warfarin dosing.     Home dose warfarin: 2.5mg  daily except 5mg  on M/F, LD 4/11  CBC: Hgb 9.9 (decreased), Plt 194  INR 3.63, supratherapeutic even for higher INR goal   Per urology, urine today is clear and CBI is off.   Goal of Therapy:  INR 2.5-3.5 Monitor platelets by anticoagulation protocol: Yes   Plan:   Hold warfarin tonight  Daily INR  Gretta Arab PharmD, BCPS Pager [phone] 04/21/2013 8:31 AM

## 2013-04-21 NOTE — Progress Notes (Signed)
Heart rate ranging from 98-110 with occasional increases in the 120's. VSS. Will report to on coming RN.

## 2013-04-21 NOTE — Progress Notes (Signed)
Heart rate sustaining  in the 120's with occasional increases to the 140's.  Cardizem gtt increased to 10mg /hr. Pt asymptomatic. VSS. Will continue to monitor.

## 2013-04-21 NOTE — Progress Notes (Signed)
Assumed care of pt at 1500. Agree with AM assessment. Will continue to monitor. Lovina Reach, RN

## 2013-04-21 NOTE — Progress Notes (Signed)
[facility]  Patient Status: Active (receiving services up to time of hospitalization)  [facility] is providing the following services: RN and PT  If patient discharges after hours, please call [phone].   Eric Wheeler 04/21/2013, 11:27 AM

## 2013-04-21 NOTE — Progress Notes (Signed)
PROGRESS NOTE   Eric Wheeler ERD:[id number] DOB: [birth year] DOA: 04/20/2013 PCP: [name], NP   Assessment/Plan:  Urinary retention - patient was admitted overnight for continuous bladder irrigation. His Foley has been changed back to a regular one this morning, no hematuria noted in his urine bag. Appreciate urology input.  Possible UTI - patient with mild leukocytosis, tachycardia, and pyuria on admission. Started empiric ceftriaxone. Urine cultures pending.   Atrial fibrillation with RVR - patient on a Cardizem drip overnight, poorly controlled rates despite resuming home medications and addressing problem #1 and 2; increase Cardizem to 10 mg per hour this morning. Cardiology consult.    Status post AVR on chronic anticoagulation - patient is on chronic Coumadin, continue that. INR therapeutic this morning. No more hematuria noted in the bag.  Acute on chronic diastolic congestive heart failure - last EF 45-50% In February of 2015. Cards to see pt while hospitalized.  Bladder cancer - urology following.   Anemia of chronic disease - hemoglobin stable, monitor. Mild drop, expected.    Diet: Heart healthy Fluids: None DVT Prophylaxis: Coumadin  Code Status: DO NOT RESUSCITATE Family Communication: Daughter and wife at bedside  Disposition Plan: Home when medically ready  Consultants:  Urology  Cardiology  Procedures:  None   Antibiotics  Anti-infectives   Start     Dose/Rate Route Frequency Ordered Stop   04/21/13 1400  cefTRIAXone (ROCEPHIN) 1 g in dextrose 5 % 50 mL IVPB     1 g 100 mL/hr over 30 Minutes Intravenous Every 24 hours 04/20/13 1354     04/20/13 1345  cefTRIAXone (ROCEPHIN) 1 g in dextrose 5 % 50 mL IVPB     1 g 100 mL/hr over 30 Minutes Intravenous  Once 04/20/13 1333 04/20/13 1433     Antibiotics Given (last 72 hours)   None      HPI/Subjective: Feeling great today, wants to go home  Objective: Filed Vitals:   04/21/13  0507 04/21/13 0625 04/21/13 0640 04/21/13 0655  BP: 114/63 125/67 114/65 123/53  Pulse: 97 122 103 113  Temp: 97.4 F (36.3 C)     TempSrc: Oral     Resp: 19 18    Height:      Weight:  71.8 kg (158 lb 4.6 oz)    SpO2: 100% 100%      Intake/Output Summary (Last 24 hours) at 04/21/13 0756 Last data filed at 04/21/13 0629  Gross per 24 hour  Intake 3529.42 ml  Output   5150 ml  Net -1620.58 ml   Filed Weights   04/20/13 1434 04/21/13 0625  Weight: 73.3 kg (161 lb 9.6 oz) 71.8 kg (158 lb 4.6 oz)    Exam:  General:  NAD  Cardiovascular: Irregularly irregular, tachycardic  Respiratory: good air movement, clear to auscultation throughout, no wheezing, ronchi or rales  Abdomen: soft, not tender to palpation, positive bowel sounds  MSK: no peripheral edema  Neuro: Nonfocal  Data Reviewed: Basic Metabolic Panel:  Recent Labs Lab 04/20/13 1106 04/21/13 0436  NA 138 141  K 4.4 4.0  CL 104 106  CO2 20 24  GLUCOSE 131* 105*  BUN 35* 32*  CREATININE 1.35 1.27  CALCIUM 8.8 8.7  MG 1.8  --    CBC:  Recent Labs Lab 04/20/13 1106 04/21/13 0436  WBC 12.4* 9.9  NEUTROABS 10.0*  --   HGB 11.0* 9.9*  HCT 35.1* 31.6*  MCV 92.9 92.9  PLT 174 194   BNP (  last 3 results)  Recent Labs  04/20/13 1106  PROBNP 5284.0*    Recent Results (from the past 240 hour(s))  CULTURE, BLOOD (ROUTINE X 2)     Status: None   Collection Time    04/20/13  1:50 PM      Result Value Ref Range Status   Specimen Description BLOOD RIGHT WRIST  3 ML IN [facility] BOTTLE   Final   Special Requests NONE   Final   Culture  Setup Time     Final   Value: 04/20/2013 17:45     Performed at [facility]   Culture     Final   Value:        BLOOD CULTURE RECEIVED NO GROWTH TO DATE CULTURE WILL BE HELD FOR 5 DAYS BEFORE ISSUING A FINAL NEGATIVE REPORT     Performed at [facility]   Report Status PENDING   Incomplete  CULTURE, BLOOD (ROUTINE X 2)     Status: None   Collection  Time    04/20/13  2:10 PM      Result Value Ref Range Status   Specimen Description BLOOD LEFT FOREARM  5 ML IN [facility] BOTTLE   Final   Special Requests NONE   Final   Culture  Setup Time     Final   Value: 04/20/2013 17:45     Performed at [facility]   Culture     Final   Value:        BLOOD CULTURE RECEIVED NO GROWTH TO DATE CULTURE WILL BE HELD FOR 5 DAYS BEFORE ISSUING A FINAL NEGATIVE REPORT     Performed at [facility]   Report Status PENDING   Incomplete     Studies: Dg Chest 2 View  04/20/2013   CLINICAL DATA:  Shortness of breath, weakness, cough  EXAM: CHEST  2 VIEW  COMPARISON:  Chest and abdomen radiograph 02/27/2013  FINDINGS: There changes of median sternotomy. Cardiomegaly is stable. Thoracic aorta atherosclerotic calcification. Pulmonary vascularity is mildly congested. There is slight chronic prominence of the interstitium. No airspace disease, pleural effusion, or pneumothorax.  Small hiatal hernia.  Bones are diffusely osteopenic. No acute osseous abnormality identified.  IMPRESSION: Cardiomegaly and mild pulmonary vascular congestion. Slight diffuse interstitial prominence may be due to chronic interstitial thickening, or mild interstitial pulmonary edema.  Small hiatal hernia.   Electronically Signed   By: Curlene Dolphin M.D.   On: 04/20/2013 12:17    Scheduled Meds: . aspirin  324 mg Oral Once  . carvedilol  6.25 mg Oral BID WC  . cefTRIAXone (ROCEPHIN)  IV  1 g Intravenous Q24H  . feeding supplement (ENSURE COMPLETE)  237 mL Oral BID BM  . finasteride  5 mg Oral q morning - 10a  . simvastatin  10 mg Oral QHS  . sodium chloride  3 mL Intravenous Q12H  . terazosin  5 mg Oral QHS  . Warfarin - Pharmacist Dosing Inpatient   Does not apply q1800   Continuous Infusions: . diltiazem (CARDIZEM) infusion 10 mg/hr (04/21/13 0625)  . sodium chloride irrigation      Principal Problem:   Atrial fibrillation with RVR Active Problems:   Chronic atrial  fibrillation   Chronic diastolic congestive heart failure- last EF 45-50%   S/P AVR (aortic valve replacement)   Bladder cancer- s/p surgery 02/20/13   UTI (urinary tract infection)   Anemia of chronic disease   Chronic anticoagulation   Protein-calorie malnutrition, severe  Hematuria   Urinary obstruction   Time spent: 35  This note has been created with Surveyor, quantity. Any transcriptional errors are unintentional.   Marzetta Board, MD [facility] Pager [phone]. If 7 PM - 7 AM, please contact night-coverage at [email or web address], password [facility] 04/21/2013, 7:56 AM  LOS: 1 day

## 2013-04-21 NOTE — Consult Note (Signed)
Admit date: 04/20/2013 Referring Physician  Dr. Cruzita Lederer Primary Cardiologist  Dr. Percival Spanish Reason for Consultation  afib with RVR  HPI: Eric Wheeler is a 80 y.o. male has a past medical history significant for A fib, AVR on chronic anticoagulation, chronic diastolic congestive heart failure, BPH, bladder cancer s/p surgery 02/20/13, followed by Dr. [name], most recently seen 2 days ago when his Foley catheter was changed, presented to the ED with decreased UOP and lower abdominal pain. Patient denied any chest pain or palpitations. He denies any shortness or breath, he has chronic shortness of breath which has not changed. He has no nausea or vomiting or diarrhea. He was brought into the emergency room, where the bladder scan showed large amount of urine.  On arrival into the emergency room, patient was found to have atrial fibrillation rapid ventricular response, and was started on a diltiazem drip and HR improved.  Cardiology is now asked to consult for help in management of atrial fibrillation.  He says that he can feel his heart race intermittently at home.  He has been more SOB over the past few months even walking out to his mailbox and gets very exhausted with minimal exercise.    PMH:   Past Medical History  Diagnosis Date  . Chronic atrial fibrillation   . Chronic anticoagulation     a. afib/st. jude avr - goal 2.5-3.5   . BPH (benign prostatic hypertrophy)   . Hypertension   . History of kidney stones   . Gout   . Syncope   . History of skin cancer   . Bladder tumor     a. s/p resection 02/19/2013.  [name] Hx of scarlet fever   . Seizures 2014    evaluated by Dr. Jannifer Franklin at [facility] neurology -  no cause determined - refered back to cardiologist - recommended loop recorder to monitor rythm but pt refused - continues to have seizures per family - notes in EPIC  . Chronic combined systolic and diastolic CHF (congestive heart failure)     a. EF 45 to 50% per echo in 2011 following  exacerbation  . Chronic kidney disease      PSH:   Past Surgical History  Procedure Laterality Date  . Aortic valve replacement  1987    #21 MM ST JUDE PLACED BY DR.CHARLES WILSON  . Cataract extraction    . Transurethral resection of prostate N/A 02/19/2013    Procedure: TRANSURETHRAL RESECTION OF THE PROSTATE /bladder WITH GYRUS INSTRUMENTS;  Surgeon: Irine Seal, MD;  Location: [facility];  Service: Urology;  Laterality: N/A;  . Cystoscopy N/A 02/19/2013    Procedure: CYSTOSCOPY;  Surgeon: Irine Seal, MD;  Location: [facility];  Service: Urology;  Laterality: N/A;    Allergies:  Ace inhibitors; Codeine; and Sulfa drugs cross reactors Prior to Admit Meds:   Prescriptions prior to admission  Medication Sig Dispense Refill  . carvedilol (COREG) 6.25 MG tablet Take 6.25 mg by mouth 2 (two) times daily with a meal.      . docusate sodium 100 MG CAPS Take 100 mg by mouth 2 (two) times daily as needed for mild constipation.  10 capsule  0  . feeding supplement, ENSURE COMPLETE, (ENSURE COMPLETE) LIQD Take 237 mLs by mouth 2 (two) times daily between meals.  10 Bottle  0  . finasteride (PROSCAR) 5 MG tablet Take 5 mg by mouth every morning.       . furosemide (LASIX) 40 MG tablet Take 0.5 tablets (  20 mg total) by mouth daily as needed. Take only as needed - for increased swelling or weight gain of 3 pounds overnight  30 tablet  3  . pantoprazole (PROTONIX) 40 MG tablet Take 40 mg by mouth daily as needed (For acid reflex).      . potassium chloride (KLOR-CON) 20 MEQ packet Take 10 mEq by mouth daily.  30 tablet  0  . simvastatin (ZOCOR) 10 MG tablet Take 10 mg by mouth at bedtime.      [name] terazosin (HYTRIN) 5 MG capsule Take 5 mg by mouth at bedtime.        . traMADol (ULTRAM) 50 MG tablet Take 50 mg by mouth every 6 (six) hours as needed for moderate pain.      [name] warfarin (COUMADIN) 5 MG tablet 2.5-5 mg daily at 6 PM. Beginning Tues, 03/11/13, take 1/2 tab (2.5mg ) once daily except every 4th day take 1 tab  (5mg ) once daily.       Fam HX:    Family History  Problem Relation Age of Onset  . Cancer Mother   . Heart disease Father   . Asthma Father   . Stroke Father   . Dementia Sister   . Heart disease Brother    Social HX:    History   Social History  . Marital Status: Married    Spouse Name: N/A    Number of Children: N/A  . Years of Education: N/A   Occupational History  . Not on file.   Social History Main Topics  . Smoking status: Former Smoker    Quit date: 01/10/1971  . Smokeless tobacco: Never Used  . Alcohol Use: No  . Drug Use: No  . Sexual Activity: Not Currently   Other Topics Concern  . Not on file   Social History Narrative  . No narrative on file     ROS:  All 11 ROS were addressed and are negative except what is stated in the HPI  Physical Exam: Blood pressure 123/53, pulse 113, temperature 97.4 F (36.3 C), temperature source Oral, resp. rate 18, height 5\' 9"  (1.753 m), weight 158 lb 4.6 oz (71.8 kg), SpO2 100.00%.    General: Well developed, well nourished, in no acute distress Head: Eyes PERRLA, No xanthomas.   Normal cephalic and atramatic  Lungs:   Crackles at bases bilaterally Heart:   Irregularly irregular and tachy S1 S2 Pulses are 2+ & equal.            No carotid bruit. No JVD.  No abdominal bruits. No femoral bruits. Abdomen: Bowel sounds are positive, abdomen soft and non-tender without masses  Extremities:   No clubbing, cyanosis or edema.  DP +1 Neuro: Alert and oriented X 3. Psych:  Good affect, responds appropriately    Labs:   Lab Results  Component Value Date   WBC 9.9 04/21/2013   HGB 9.9* 04/21/2013   HCT 31.6* 04/21/2013   MCV 92.9 04/21/2013   PLT 194 04/21/2013    Recent Labs Lab 04/21/13 0436  NA 141  K 4.0  CL 106  CO2 24  BUN 32*  CREATININE 1.27  CALCIUM 8.7  GLUCOSE 105*   No results found for this basename: PTT   Lab Results  Component Value Date   INR 3.63* 04/21/2013   INR 3.16* 04/20/2013   INR  3.0 04/14/2013   Lab Results  Component Value Date   CKTOTAL 137 11/09/2009   CKMB 5.3* 11/09/2009  TROPONINI  Value: 0.02        NO INDICATION OF MYOCARDIAL INJURY. 11/09/2009     Lab Results  Component Value Date   CHOL 148 09/20/2012   CHOL 146 12/22/2010   CHOL 144 06/13/2010   Lab Results  Component Value Date   HDL 33.50* 09/20/2012   HDL 37.10* 12/22/2010   HDL 41.80 06/13/2010   Lab Results  Component Value Date   LDLCALC 95 09/20/2012   LDLCALC 96 12/22/2010   LDLCALC 87 06/13/2010   Lab Results  Component Value Date   TRIG 97.0 09/20/2012   TRIG 67.0 12/22/2010   TRIG 76.0 06/13/2010   Lab Results  Component Value Date   CHOLHDL 4 09/20/2012   CHOLHDL 4 12/22/2010   CHOLHDL 3 06/13/2010   No results found for this basename: LDLDIRECT      Radiology:  Dg Chest 2 View  04/20/2013   CLINICAL DATA:  Shortness of breath, weakness, cough  EXAM: CHEST  2 VIEW  COMPARISON:  Chest and abdomen radiograph 02/27/2013  FINDINGS: There changes of median sternotomy. Cardiomegaly is stable. Thoracic aorta atherosclerotic calcification. Pulmonary vascularity is mildly congested. There is slight chronic prominence of the interstitium. No airspace disease, pleural effusion, or pneumothorax.  Small hiatal hernia.  Bones are diffusely osteopenic. No acute osseous abnormality identified.  IMPRESSION: Cardiomegaly and mild pulmonary vascular congestion. Slight diffuse interstitial prominence may be due to chronic interstitial thickening, or mild interstitial pulmonary edema.  Small hiatal hernia.   Electronically Signed   By: Curlene Dolphin M.D.   On: 04/20/2013 12:17    EKG:  Atrial fibrillation with RVR and occasional aberrant QRS complexes and anterior infarct age undetermined  ASSESSMENT:  1.  Chronic atrial fibrillation now with RVR - still poorly rate controlled on Cardizem gtt at 10mg /hr 2.  Chronic systemic anticoagulation with therapuetic INR 3.  Acute on chronic combined  systolic/diastolic CHF - EF echo 04/6960 45-50% - mild pulmonary edema on chest xray and BNP elevated 4.  S/P AVR ST Jude on chronic anticoagulation 5.  HTN 6.  Chronic DOE which has worsened in the past few months.  I wonder whether he has been in afib with RVR for a while now leading to CHF and worsening SOB.  He has noticed intermittent palpitations for several months. In looking back at his last OV with [name], NP, he was complaining in march that he had spells where his breathing would "stop".  He has refused further cardiac testing in the past.  His HR was 90 at OV on March 13th. Recent 2D echo showed EF 45-50%.  PLAN:   1.  Continue Cardizem gtt 2.  Increase Coreg to 12.5mg  BID for better HR control 3.  He is not on an ACE I due to dizziness in the past 4.  Lasix 20mg  IV BID 5.  Continue Coumadin for AVR/afib 6.  Check TSH  Sueanne Margarita, MD  04/21/2013  9:58 AM

## 2013-04-22 DIAGNOSIS — D638 Anemia in other chronic diseases classified elsewhere: Secondary | ICD-10-CM

## 2013-04-22 DIAGNOSIS — Z7901 Long term (current) use of anticoagulants: Secondary | ICD-10-CM | POA: Diagnosis not present

## 2013-04-22 DIAGNOSIS — I4891 Unspecified atrial fibrillation: Secondary | ICD-10-CM | POA: Diagnosis not present

## 2013-04-22 DIAGNOSIS — R339 Retention of urine, unspecified: Secondary | ICD-10-CM | POA: Diagnosis not present

## 2013-04-22 DIAGNOSIS — I5043 Acute on chronic combined systolic (congestive) and diastolic (congestive) heart failure: Secondary | ICD-10-CM | POA: Diagnosis not present

## 2013-04-22 DIAGNOSIS — R310 Gross hematuria: Secondary | ICD-10-CM | POA: Diagnosis not present

## 2013-04-22 LAB — CBC
HCT: 29.9 % — ABNORMAL LOW (ref 39.0–52.0)
Hemoglobin: 9.4 g/dL — ABNORMAL LOW (ref 13.0–17.0)
MCH: 29.2 pg (ref 26.0–34.0)
MCHC: 31.4 g/dL (ref 30.0–36.0)
MCV: 92.9 fL (ref 78.0–100.0)
Platelets: 192 10*3/uL (ref 150–400)
RBC: 3.22 MIL/uL — ABNORMAL LOW (ref 4.22–5.81)
RDW: 16 % — AB (ref 11.5–15.5)
WBC: 9.9 10*3/uL (ref 4.0–10.5)

## 2013-04-22 LAB — BASIC METABOLIC PANEL
BUN: 39 mg/dL — AB (ref 6–23)
CO2: 26 mEq/L (ref 19–32)
Calcium: 9.3 mg/dL (ref 8.4–10.5)
Chloride: 104 mEq/L (ref 96–112)
Creatinine, Ser: 1.28 mg/dL (ref 0.50–1.35)
GFR, EST AFRICAN AMERICAN: 55 mL/min — AB (ref >90)
GFR, EST NON AFRICAN AMERICAN: 47 mL/min — AB (ref >90)
Glucose, Bld: 107 mg/dL — ABNORMAL HIGH (ref 70–99)
Potassium: 3.8 mEq/L (ref 3.7–5.3)
SODIUM: 142 meq/L (ref 137–147)

## 2013-04-22 LAB — PROTIME-INR
INR: 2.96 — AB (ref 0.00–1.49)
PROTHROMBIN TIME: 29.8 s — AB (ref 11.6–15.2)

## 2013-04-22 MED ORDER — WARFARIN SODIUM 2 MG PO TABS
2.0000 mg | ORAL_TABLET | Freq: Once | ORAL | Status: AC
  Administered 2013-04-22: 2 mg via ORAL
  Filled 2013-04-22: qty 1

## 2013-04-22 MED ORDER — DILTIAZEM HCL 60 MG PO TABS
60.0000 mg | ORAL_TABLET | Freq: Four times a day (QID) | ORAL | Status: DC
  Administered 2013-04-22 – 2013-04-23 (×4): 60 mg via ORAL
  Filled 2013-04-22 (×8): qty 1

## 2013-04-22 NOTE — Progress Notes (Signed)
ANTICOAGULATION CONSULT NOTE - Follow up [facility] for Warfarin Indication: atrial fibrillation and AVR  Allergies  Allergen Reactions  . Ace Inhibitors Other (See Comments)    Dizziness   . Codeine Nausea And Vomiting  . Sulfa Drugs Cross Reactors Nausea And Vomiting    Patient Measurements: Height: 5\' 9"  (175.3 cm) Weight: 155 lb 11.2 oz (70.625 kg) IBW/kg (Calculated) : 70.7  Vital Signs: Temp: 98.3 F (36.8 C) (04/14 0541) Temp src: Oral (04/14 0541) BP: 110/51 mmHg (04/14 0541) Pulse Rate: 77 (04/14 0541)  Labs:  Recent Labs  04/20/13 1106 04/21/13 0436 04/22/13 0447  HGB 11.0* 9.9* 9.4*  HCT 35.1* 31.6* 29.9*  PLT 174 194 192  LABPROT 31.3* 34.8* 29.8*  INR 3.16* 3.63* 2.96*  CREATININE 1.35 1.27 1.28    Estimated Creatinine Clearance: 37.5 ml/min (by C-G formula based on Cr of 1.28).  Assessment: 85 yoM on chronic warfarin for hx AF and AVR, also with CKD, bladder cancer s/p resection 02/19/13 with chronic indwelling catheter who presents to [facility] with CC of decreased UOP and hematuria.  Foley has since been exchanged for a 3-way catheter with continuous bladder irrigation and has passed multiple bloody clots.  Pharmacy consulted to resume warfarin dosing.     Home dose warfarin: 2.5mg  daily except 5mg  on M/F, LD 4/11.  INR therapeutic on admission.  CBC: Hgb 9.4 (decreased slightly), Plt 194.  No reported bleeding or complications.  Per urology, urine today is clear and CBI is off.  INR 2.96, therapeutic.  Diet: eating 75-100% of meals + Ensure supplements   Goal of Therapy:  INR 2.5-3.5 Monitor platelets by anticoagulation protocol: Yes   Plan:   Warfarin 2mg  PO tonight x1  Daily INR  Gretta Arab PharmD, BCPS Pager [phone] 04/22/2013 10:19 AM

## 2013-04-22 NOTE — Progress Notes (Signed)
Patient ID: Eric Wheeler, male   DOB: 03-04-1919, 80 y.o.   MRN: [id number]    Subjective: [name] has clear urine today and his hgb only fell a little bit more to 9.4.  He is without complaints.   His INR is <3 today.    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  ROS:  Review of Systems  Constitutional: Negative for fever.  Gastrointestinal: Negative for abdominal pain.  Genitourinary: Negative for hematuria.    Anti-infectives: Anti-infectives   Start     Dose/Rate Route Frequency Ordered Stop   04/21/13 1400  cefTRIAXone (ROCEPHIN) 1 g in dextrose 5 % 50 mL IVPB     1 g 100 mL/hr over 30 Minutes Intravenous Every 24 hours 04/20/13 1354     04/20/13 1345  cefTRIAXone (ROCEPHIN) 1 g in dextrose 5 % 50 mL IVPB     1 g 100 mL/hr over 30 Minutes Intravenous  Once 04/20/13 1333 04/20/13 1433      Current Facility-Administered Medications  Medication Dose Route Frequency Provider Last Rate Last Dose  . calcium carbonate (TUMS - dosed in mg elemental calcium) chewable tablet 400 mg of elemental calcium  2 tablet Oral TID PRN Caren Griffins, MD   400 mg of elemental calcium at 04/21/13 2055  . carvedilol (COREG) tablet 12.5 mg  12.5 mg Oral BID WC Sueanne Margarita, MD   12.5 mg at 04/22/13 0800  . cefTRIAXone (ROCEPHIN) 1 g in dextrose 5 % 50 mL IVPB  1 g Intravenous Q24H Caren Griffins, MD   1 g at 04/21/13 1420  . diltiazem (CARDIZEM) tablet 60 mg  60 mg Oral 4 times per day Peter M Martinique, MD      . docusate sodium (COLACE) capsule 100 mg  100 mg Oral BID PRN Caren Griffins, MD      . feeding supplement (ENSURE COMPLETE) (ENSURE COMPLETE) liquid 237 mL  237 mL Oral BID BM Costin Karlyne Greenspan, MD   237 mL at 04/21/13 1420  . finasteride (PROSCAR) tablet 5 mg  5 mg Oral q morning - 10a Costin Karlyne Greenspan, MD   5 mg at 04/21/13 0941  . pantoprazole (PROTONIX) EC tablet 40 mg  40 mg Oral Daily PRN Costin Karlyne Greenspan, MD      . simvastatin (ZOCOR)  tablet 10 mg  10 mg Oral QHS Caren Griffins, MD   10 mg at 04/21/13 2054  . sodium chloride 0.9 % injection 3 mL  3 mL Intravenous Q12H Caren Griffins, MD   3 mL at 04/21/13 0941  . sodium chloride irrigation 0.9 % 3,000 mL  3,000 mL Irrigation Continuous Molli Hazard, MD      . terazosin (HYTRIN) capsule 5 mg  5 mg Oral QHS Caren Griffins, MD   5 mg at 04/21/13 2054  . traMADol (ULTRAM) tablet 50 mg  50 mg Oral Q6H PRN Costin Karlyne Greenspan, MD      . Warfarin - Pharmacist Dosing Inpatient   Does not apply [place], MD         Objective: Vital signs in last 24 hours: Temp:  [98.1 F (36.7 C)-98.3 F (36.8 C)] 98.3 F (36.8 C) (04/14 0541) Pulse Rate:  [77-130] 77 (04/14 0541) Resp:  [16-18] 18 (04/14 0541) BP: (110-136)/(51-68) 110/51 mmHg (04/14 0541) SpO2:  [99 %-100 %] 99 % (04/14 0541) Weight:  [70.625 kg (  155 lb 11.2 oz)] 70.625 kg (155 lb 11.2 oz) (04/14 0500)  Intake/Output from previous day: 04/13 0701 - 04/14 0700 In: 1205.2 [P.O.:960; I.V.:245.2] Out: 1850 [Urine:1850] Intake/Output this shift:     Physical Exam  Lab Results:   Recent Labs  04/21/13 0436 04/22/13 0447  WBC 9.9 9.9  HGB 9.9* 9.4*  HCT 31.6* 29.9*  PLT 194 192   BMET  Recent Labs  04/21/13 0436 04/22/13 0447  NA 141 142  K 4.0 3.8  CL 106 104  CO2 24 26  GLUCOSE 105* 107*  BUN 32* 39*  CREATININE 1.27 1.28  CALCIUM 8.7 9.3   PT/INR  Recent Labs  04/21/13 0436 04/22/13 0447  LABPROT 34.8* 29.8*  INR 3.63* 2.96*   ABG No results found for this basename: PHART, PCO2, PO2, HCO3,  in the last 72 hours  Studies/Results: Dg Chest 2 View  04/20/2013   CLINICAL DATA:  Shortness of breath, weakness, cough  EXAM: CHEST  2 VIEW  COMPARISON:  Chest and abdomen radiograph 02/27/2013  FINDINGS: There changes of median sternotomy. Cardiomegaly is stable. Thoracic aorta atherosclerotic calcification. Pulmonary vascularity is mildly congested. There is slight  chronic prominence of the interstitium. No airspace disease, pleural effusion, or pneumothorax.  Small hiatal hernia.  Bones are diffusely osteopenic. No acute osseous abnormality identified.  IMPRESSION: Cardiomegaly and mild pulmonary vascular congestion. Slight diffuse interstitial prominence may be due to chronic interstitial thickening, or mild interstitial pulmonary edema.  Small hiatal hernia.   Electronically Signed   By: Curlene Dolphin M.D.   On: 04/20/2013 12:17     Assessment: CIS with recurrent hematuria on anticoagulation for cardiac issues.  The urine is clear today.  He remains on ceftriaxone Plan: I will have the foley removed today and will try to get him set up to begin BCG in the next week or two.      LOS: 2 days    Irine Seal 04/22/2013

## 2013-04-22 NOTE — Progress Notes (Signed)
Pt has been able to void "drops" a couple of times today.  Six hrs since foley d/c, and bladder scan shows 532cc urine.  Pt has no discomfort or urge to void, in and out cath done per order with 600cc return.  A few small blood clots noted, urine was brownish/dark red

## 2013-04-22 NOTE — Progress Notes (Signed)
TELEMETRY: Reviewed telemetry pt in atrial fibrillation with controlled ventricular response in the 70s: Filed Vitals:   04/21/13 1535 04/21/13 2140 04/22/13 0500 04/22/13 0541  BP: 136/64 135/68  110/51  Pulse: 130 112  77  Temp: 98.1 F (36.7 C) 98.2 F (36.8 C)  98.3 F (36.8 C)  TempSrc: Oral Oral  Oral  Resp: 16 18  18   Height:      Weight:   155 lb 11.2 oz (70.625 kg)   SpO2: 100% 100%  99%    Intake/Output Summary (Last 24 hours) at 04/22/13 0759 Last data filed at 04/22/13 0700  Gross per 24 hour  Intake 1205.17 ml  Output   1850 ml  Net -644.83 ml    SUBJECTIVE Feeling better. Denies SOB or chest pain.  LABS: Basic Metabolic Panel:  Recent Labs  04/20/13 1106 04/21/13 0436 04/22/13 0447  NA 138 141 142  K 4.4 4.0 3.8  CL 104 106 104  CO2 20 24 26   GLUCOSE 131* 105* 107*  BUN 35* 32* 39*  CREATININE 1.35 1.27 1.28  CALCIUM 8.8 8.7 9.3  MG 1.8  --   --    Liver Function Tests: No results found for this basename: AST, ALT, ALKPHOS, BILITOT, PROT, ALBUMIN,  in the last 72 hours No results found for this basename: LIPASE, AMYLASE,  in the last 72 hours CBC:  Recent Labs  04/20/13 1106 04/21/13 0436 04/22/13 0447  WBC 12.4* 9.9 9.9  NEUTROABS 10.0*  --   --   HGB 11.0* 9.9* 9.4*  HCT 35.1* 31.6* 29.9*  MCV 92.9 92.9 92.9  PLT 174 194 192   Cardiac Enzymes: No results found for this basename: CKTOTAL, CKMB, CKMBINDEX, TROPONINI,  in the last 72 hours BNP: 5284  Radiology/Studies:  Dg Chest 2 View  04/20/2013   CLINICAL DATA:  Shortness of breath, weakness, cough  EXAM: CHEST  2 VIEW  COMPARISON:  Chest and abdomen radiograph 02/27/2013  FINDINGS: There changes of median sternotomy. Cardiomegaly is stable. Thoracic aorta atherosclerotic calcification. Pulmonary vascularity is mildly congested. There is slight chronic prominence of the interstitium. No airspace disease, pleural effusion, or pneumothorax.  Small hiatal hernia.  Bones are  diffusely osteopenic. No acute osseous abnormality identified.  IMPRESSION: Cardiomegaly and mild pulmonary vascular congestion. Slight diffuse interstitial prominence may be due to chronic interstitial thickening, or mild interstitial pulmonary edema.  Small hiatal hernia.   Electronically Signed   By: Curlene Dolphin M.D.   On: 04/20/2013 12:17   Ecg: afib, LAD, old anterior infarct.  Echo 03/03/12 Study Conclusions  - Left ventricle: The cavity size was normal. Wall thickness was normal. Systolic function was mildly reduced. The estimated ejection fraction was in the range of 45% to 50%. Diffuse hypokinesis. Indeterminant diastolic function (atrial fibrillation). - Aortic valve: St Jude mechanical aortic valve. No significant prosthetic valve regurgitation or stenosis. Mean gradient: 57mm Hg (S). - Mitral valve: Moderately calcified annulus. Moderately calcified leaflets . Mild to moderate regurgitation. - Left atrium: The atrium was mildly to moderately dilated. - Right ventricle: The cavity size was mildly dilated. Systolic function was normal. - Right atrium: The atrium was moderately dilated. - Tricuspid valve: Peak RV-RA gradient: 68mm Hg (S). Maximal regurgitant velocity: 292cm/s. - Pulmonary arteries: PA peak pressure: 60mm Hg (S). - Systemic veins: IVC measured 2.3 cm with < 50% respirophasic variation, suggesting RA pressure 15 mmHg. Impressions:  - The patient was in atrial fibrillation. Normal LV size with EF 45-50%, mild  diffuse hypokinesis. Mild to moderate MR. St Jude mechanical aortic valve appeared to function normally. Mildly dilated RV with normal systolic function. Moderate pulmonary hypertension.   PHYSICAL EXAM General: Well developed, elderly, in no acute distress. Head: Normocephalic, atraumatic, sclera non-icteric, no xanthomas, nares are without discharge. Neck: Negative for carotid bruits. JVD not elevated. Lungs: Few crackles. Breathing is  unlabored. Heart: IRRR S1 S2 without murmurs, rubs, or gallops.  Abdomen: Soft, non-tender, non-distended with normoactive bowel sounds.  No obvious abdominal masses. Extremities: No clubbing, cyanosis or edema.  Distal pedal pulses are 1+ and equal bilaterally. Neuro: Alert and oriented X 3. Moves all extremities spontaneously. Psych:  Responds to questions appropriately with a normal affect.  ASSESSMENT AND PLAN: 1. Atrial fibrillation with RVR. Rate now well controlled on IV cardizem and increased coreg dose. Will start po cardizem 60 mg po qid. Try an stop IV cardizem. If rate controlled can switch to long acting cardizem formulation tomorrow. 2. Anticoagulation on coumadin 3. Acute on chronic combined systolic and diastolic CHF. Good diuresis. Weight down 6 lbs. Will switch lasix to po today. 4. S/p AVR with St. Jude prosthesis.  5. HTN   Principal Problem:   Atrial fibrillation with RVR Active Problems:   Chronic atrial fibrillation   Chronic diastolic congestive heart failure- last EF 45-50%   S/P AVR (aortic valve replacement)   Bladder cancer- s/p surgery 02/20/13   UTI (urinary tract infection)   Anemia of chronic disease   Chronic anticoagulation   Protein-calorie malnutrition, severe   Hematuria   Urinary obstruction   Acute on chronic combined systolic and diastolic congestive heart failure    Signed, Gypsy Kellogg M Martinique MD,FACC 04/22/2013 8:07 AM

## 2013-04-22 NOTE — Progress Notes (Signed)
PROGRESS NOTE   Eric Wheeler [id number]:[id number] DOB: 07/25/1919 DOA: 04/20/2013 PCP: [name], NP   Assessment/Plan:  Urinary retention - patient was admitted 4/12 and overnight for first night underwent continuous bladder irrigation. His Foley has been changed back to a regular one 4/13 am and per urology removed completely 4/14 and and will have a voiding trial later.  His hematuria has resolved.  Possible UTI - patient with mild leukocytosis, tachycardia, and pyuria on admission. Started empiric ceftriaxone. Urine cultures pending.   Atrial fibrillation with RVR - patient on a Cardizem drip still, poorly controlled rates despite resuming home medications and addressing problem #1 and 2;  - Cardiology following, appreciate input. Start oral Cardizem and try to discontinue the drip later today  Status post AVR on chronic anticoagulation - patient is on chronic Coumadin, continue that. INR therapeutic this morning. No more hematuria noted in the bag.  Acute on chronic diastolic congestive heart failure - last EF 45-50% In February of 2015.  - euvolemic  Bladder cancer - urology following.   Anemia of chronic disease - hemoglobin stable, monitor. Mild drop, expected.    Diet: Heart healthy Fluids: None DVT Prophylaxis: Coumadin  Code Status: DO NOT RESUSCITATE Family Communication: Daughter and wife at bedside  Disposition Plan: Home when medically ready  Consultants:  Urology  Cardiology  Procedures:  None   Antibiotics  ceftriaxone 4/12 >>  HPI/Subjective: - Continues to be asymptomatic, no chest pain, no shortness of breath.   Objective: Filed Vitals:   04/21/13 1535 04/21/13 2140 04/22/13 0500 04/22/13 0541  BP: 136/64 135/68  110/51  Pulse: 130 112  77  Temp: 98.1 F (36.7 C) 98.2 F (36.8 C)  98.3 F (36.8 C)  TempSrc: Oral Oral  Oral  Resp: 16 18  18   Height:      Weight:   70.625 kg (155 lb 11.2 oz)   SpO2: 100% 100%  99%     Intake/Output Summary (Last 24 hours) at 04/22/13 1042 Last data filed at 04/22/13 1010  Gross per 24 hour  Intake 965.17 ml  Output   2150 ml  Net -1184.83 ml   Filed Weights   04/20/13 1434 04/21/13 0625 04/22/13 0500  Weight: 73.3 kg (161 lb 9.6 oz) 71.8 kg (158 lb 4.6 oz) 70.625 kg (155 lb 11.2 oz)    Exam:  General:  NAD  Cardiovascular: Irregularly irregular, click present   Respiratory: good air movement, clear to auscultation throughout, no wheezing, ronchi or rales  Abdomen: soft, not tender to palpation, positive bowel sounds  MSK: no peripheral edema  Neuro: Nonfocal  Data Reviewed: Basic Metabolic Panel:  Recent Labs Lab 04/20/13 1106 04/21/13 0436 04/22/13 0447  NA 138 141 142  K 4.4 4.0 3.8  CL 104 106 104  CO2 20 24 26   GLUCOSE 131* 105* 107*  BUN 35* 32* 39*  CREATININE 1.35 1.27 1.28  CALCIUM 8.8 8.7 9.3  MG 1.8  --   --    CBC:  Recent Labs Lab 04/20/13 1106 04/21/13 0436 04/22/13 0447  WBC 12.4* 9.9 9.9  NEUTROABS 10.0*  --   --   HGB 11.0* 9.9* 9.4*  HCT 35.1* 31.6* 29.9*  MCV 92.9 92.9 92.9  PLT 174 194 192   BNP (last 3 results)  Recent Labs  04/20/13 1106  PROBNP 5284.0*    Recent Results (from the past 240 hour(s))  URINE CULTURE     Status: None   Collection Time  04/20/13 11:47 AM      Result Value Ref Range Status   Specimen Description URINE, CATHETERIZED   Final   Special Requests NONE   Final   Culture  Setup Time     Final   Value: 04/20/2013 17:49     Performed at [facility]     Final   Value: NO GROWTH     Performed at [facility]   Culture     Final   Value: NO GROWTH     Performed at [facility]   Report Status 04/21/2013 FINAL   Final  CULTURE, BLOOD (ROUTINE X 2)     Status: None   Collection Time    04/20/13  1:50 PM      Result Value Ref Range Status   Specimen Description BLOOD RIGHT WRIST  3 ML IN [facility] BOTTLE   Final   Special Requests  NONE   Final   Culture  Setup Time     Final   Value: 04/20/2013 17:45     Performed at [facility]   Culture     Final   Value:        BLOOD CULTURE RECEIVED NO GROWTH TO DATE CULTURE WILL BE HELD FOR 5 DAYS BEFORE ISSUING A FINAL NEGATIVE REPORT     Performed at [facility]   Report Status PENDING   Incomplete  CULTURE, BLOOD (ROUTINE X 2)     Status: None   Collection Time    04/20/13  2:10 PM      Result Value Ref Range Status   Specimen Description BLOOD LEFT FOREARM  5 ML IN [facility] BOTTLE   Final   Special Requests NONE   Final   Culture  Setup Time     Final   Value: 04/20/2013 17:45     Performed at [facility]   Culture     Final   Value:        BLOOD CULTURE RECEIVED NO GROWTH TO DATE CULTURE WILL BE HELD FOR 5 DAYS BEFORE ISSUING A FINAL NEGATIVE REPORT     Performed at [facility]   Report Status PENDING   Incomplete     Studies: Dg Chest 2 View  04/20/2013   CLINICAL DATA:  Shortness of breath, weakness, cough  EXAM: CHEST  2 VIEW  COMPARISON:  Chest and abdomen radiograph 02/27/2013  FINDINGS: There changes of median sternotomy. Cardiomegaly is stable. Thoracic aorta atherosclerotic calcification. Pulmonary vascularity is mildly congested. There is slight chronic prominence of the interstitium. No airspace disease, pleural effusion, or pneumothorax.  Small hiatal hernia.  Bones are diffusely osteopenic. No acute osseous abnormality identified.  IMPRESSION: Cardiomegaly and mild pulmonary vascular congestion. Slight diffuse interstitial prominence may be due to chronic interstitial thickening, or mild interstitial pulmonary edema.  Small hiatal hernia.   Electronically Signed   By: Curlene Dolphin M.D.   On: 04/20/2013 12:17    Scheduled Meds: . carvedilol  12.5 mg Oral BID WC  . cefTRIAXone (ROCEPHIN)  IV  1 g Intravenous Q24H  . diltiazem  60 mg Oral 4 times per day  . feeding supplement (ENSURE COMPLETE)  237 mL Oral BID BM  .  finasteride  5 mg Oral q morning - 10a  . simvastatin  10 mg Oral QHS  . sodium chloride  3 mL Intravenous Q12H  . terazosin  5 mg Oral QHS  . warfarin  2  mg Oral ONCE-1800  . Warfarin - Pharmacist Dosing Inpatient   Does not apply q1800   Continuous Infusions: . sodium chloride irrigation      Principal Problem:   Atrial fibrillation with RVR Active Problems:   Chronic atrial fibrillation   Chronic diastolic congestive heart failure- last EF 45-50%   S/P AVR (aortic valve replacement)   Bladder cancer- s/p surgery 02/20/13   UTI (urinary tract infection)   Anemia of chronic disease   Chronic anticoagulation   Protein-calorie malnutrition, severe   Hematuria   Urinary obstruction   Acute on chronic combined systolic and diastolic congestive heart failure  Time spent: 25  This note has been created with Surveyor, quantity. Any transcriptional errors are unintentional.   Marzetta Board, MD [facility] Pager [phone]. If 7 PM - 7 AM, please contact night-coverage at [email or web address], password [email or web address] 04/22/2013, 10:42 AM  LOS: 2 days

## 2013-04-23 DIAGNOSIS — I5043 Acute on chronic combined systolic (congestive) and diastolic (congestive) heart failure: Secondary | ICD-10-CM | POA: Diagnosis not present

## 2013-04-23 DIAGNOSIS — N179 Acute kidney failure, unspecified: Secondary | ICD-10-CM

## 2013-04-23 DIAGNOSIS — D638 Anemia in other chronic diseases classified elsewhere: Secondary | ICD-10-CM | POA: Diagnosis not present

## 2013-04-23 DIAGNOSIS — Z954 Presence of other heart-valve replacement: Secondary | ICD-10-CM | POA: Diagnosis not present

## 2013-04-23 DIAGNOSIS — I4891 Unspecified atrial fibrillation: Secondary | ICD-10-CM | POA: Diagnosis not present

## 2013-04-23 DIAGNOSIS — I509 Heart failure, unspecified: Secondary | ICD-10-CM | POA: Diagnosis not present

## 2013-04-23 DIAGNOSIS — R339 Retention of urine, unspecified: Secondary | ICD-10-CM | POA: Diagnosis not present

## 2013-04-23 DIAGNOSIS — R310 Gross hematuria: Secondary | ICD-10-CM | POA: Diagnosis not present

## 2013-04-23 LAB — CBC
HEMATOCRIT: 29.6 % — AB (ref 39.0–52.0)
Hemoglobin: 9.8 g/dL — ABNORMAL LOW (ref 13.0–17.0)
MCH: 30.2 pg (ref 26.0–34.0)
MCHC: 33.1 g/dL (ref 30.0–36.0)
MCV: 91.1 fL (ref 78.0–100.0)
Platelets: 208 10*3/uL (ref 150–400)
RBC: 3.25 MIL/uL — ABNORMAL LOW (ref 4.22–5.81)
RDW: 15.7 % — AB (ref 11.5–15.5)
WBC: 8.5 10*3/uL (ref 4.0–10.5)

## 2013-04-23 LAB — PROTIME-INR
INR: 2.43 — AB (ref 0.00–1.49)
Prothrombin Time: 25.6 seconds — ABNORMAL HIGH (ref 11.6–15.2)

## 2013-04-23 MED ORDER — FUROSEMIDE 40 MG PO TABS
40.0000 mg | ORAL_TABLET | Freq: Every day | ORAL | Status: DC
  Administered 2013-04-23: 40 mg via ORAL
  Filled 2013-04-23: qty 1

## 2013-04-23 MED ORDER — DILTIAZEM HCL ER COATED BEADS 240 MG PO CP24: 240.0000 mg | ORAL_CAPSULE | Freq: Every day | ORAL | Status: DC

## 2013-04-23 MED ORDER — DILTIAZEM HCL ER COATED BEADS 240 MG PO CP24
240.0000 mg | ORAL_CAPSULE | Freq: Every day | ORAL | Status: DC
  Administered 2013-04-23: 240 mg via ORAL
  Filled 2013-04-23: qty 1

## 2013-04-23 MED ORDER — FUROSEMIDE 40 MG PO TABS: 40.0000 mg | ORAL_TABLET | Freq: Every day | ORAL | Status: DC

## 2013-04-23 MED ORDER — WARFARIN SODIUM 3 MG PO TABS
3.0000 mg | ORAL_TABLET | Freq: Once | ORAL | Status: DC
  Filled 2013-04-23: qty 1

## 2013-04-23 NOTE — Progress Notes (Signed)
Patient is unable to void after attempts. Bladder scan revealed 572 ml urine retention. Lamar Blinks NP was made aware. Foley catheter inserted as ordered. Will continue to monitor patient.

## 2013-04-23 NOTE — Discharge Instructions (Signed)
Acute Urinary Retention, Male °Acute urinary retention is the temporary inability to urinate. °This is a common problem in older men. As men age their prostates become larger and block the flow of urine from the bladder. This is usually a problem that has come on gradually.  °HOME CARE INSTRUCTIONS °If you are sent home with a Foley catheter and a drainage system, you will need to discuss the best course of action with your health care provider. While the catheter is in, maintain a good intake of fluids. Keep the drainage bag emptied and lower than your catheter. This is so that contaminated urine will not flow back into your bladder, which could lead to a urinary tract infection. °There are two main types of drainage bags. One is a large bag that usually is used at night. It has a good capacity that will allow you to sleep through the night without having to empty it. The second type is called a leg bag. It has a smaller capacity, so it needs to be emptied more frequently. However, the main advantage is that it can be attached by a leg strap and can go underneath your clothing, allowing you the freedom to move about or leave your home. °Only take over-the-counter or prescription medicines for pain, discomfort, or fever as directed by your health care provider.  °SEEK MEDICAL CARE IF: °· You develop a low-grade fever. °· You experience spasms or leakage of urine with the spasms. °SEEK IMMEDIATE MEDICAL CARE IF:  °· You develop chills or fever. °· Your catheter stops draining urine. °· Your catheter falls out. °· You start to develop increased bleeding that does not respond to rest and increased fluid intake. °MAKE SURE YOU: °· Understand these instructions. °· Will watch your condition. °· Will get help right away if you are not doing well or get worse. °Document Released: 04/03/2000 Document Revised: 08/28/2012 Document Reviewed: 06/06/2012 °ExitCare® Patient Information ©2014 [employer]. ° °

## 2013-04-23 NOTE — Progress Notes (Signed)
ANTICOAGULATION CONSULT NOTE - Follow up Eric Wheeler for Warfarin Indication: atrial fibrillation and AVR  Allergies  Allergen Reactions  . Ace Inhibitors Other (See Comments)    Dizziness   . Codeine Nausea And Vomiting  . Sulfa Drugs Cross Reactors Nausea And Vomiting    Patient Measurements: Height: 5\' 9"  (175.3 cm) Weight: 156 lb 12 oz (71.1 kg) IBW/kg (Calculated) : 70.7  Vital Signs: Temp: 97.6 F (36.4 C) (04/15 0534) Temp src: Oral (04/15 0534) BP: 100/52 mmHg (04/15 0534) Pulse Rate: 102 (04/15 0534)  Labs:  Recent Labs  04/21/13 0436 04/22/13 0447 04/23/13 0410  HGB 9.9* 9.4* 9.8*  HCT 31.6* 29.9* 29.6*  PLT 194 192 208  LABPROT 34.8* 29.8* 25.6*  INR 3.63* 2.96* 2.43*  CREATININE 1.27 1.28  --     Estimated Creatinine Clearance: 37.6 ml/min (by C-G formula based on Cr of 1.28).  Assessment: 60 yoM on chronic warfarin for hx AF and AVR, also with CKD, bladder cancer s/p resection 02/19/13 with chronic indwelling catheter who presents to [facility] with CC of decreased UOP and hematuria.  Foley has since been exchanged for a 3-way catheter with continuous bladder irrigation and has passed multiple bloody clots.  Pharmacy consulted to resume warfarin dosing.     Home dose warfarin: 2.5mg  daily except 5mg  on M/F, LD 4/11.  INR therapeutic on admission.  INR 2.43, just slightly subtherapeutic (after holding dose on 4/13 then reduced dose on 4/14)  CBC: Hgb 9.8, Plt 208.    No reported bleeding or complications.  Per urology, no further hematuria.  Diet: eating 75-100% of meals + Ensure supplements   Goal of Therapy:  INR 2.5-3.5 Monitor platelets by anticoagulation protocol: Yes   Plan:   Warfarin 3 mg PO tonight x1  Daily INR  Gretta Arab PharmD, BCPS Pager [phone] 04/23/2013 11:29 AM

## 2013-04-23 NOTE — Progress Notes (Signed)
Patient ID: Eric Wheeler, male   DOB: 08/24/1919, 80 y.o.   MRN: [id number]    Subjective: Eric Wheeler failed his voiding trial yesterday and the foley was replaced.   The urine remains clear.  ROS:  Review of Systems  Constitutional: Negative for fever.  Gastrointestinal: Negative for abdominal pain.  Genitourinary: Negative for hematuria.    Anti-infectives: Anti-infectives   Start     Dose/Rate Route Frequency Ordered Stop   04/21/13 1400  cefTRIAXone (ROCEPHIN) 1 g in dextrose 5 % 50 mL IVPB     1 g 100 mL/hr over 30 Minutes Intravenous Every 24 hours 04/20/13 1354     04/20/13 1345  cefTRIAXone (ROCEPHIN) 1 g in dextrose 5 % 50 mL IVPB     1 g 100 mL/hr over 30 Minutes Intravenous  Once 04/20/13 1333 04/20/13 1433      Current Facility-Administered Medications  Medication Dose Route Frequency Provider Last Rate Last Dose  . calcium carbonate (TUMS - dosed in mg elemental calcium) chewable tablet 400 mg of elemental calcium  2 tablet Oral TID PRN Caren Griffins, MD   400 mg of elemental calcium at 04/21/13 2055  . carvedilol (COREG) tablet 12.5 mg  12.5 mg Oral BID WC Sueanne Margarita, MD   12.5 mg at 04/23/13 0747  . cefTRIAXone (ROCEPHIN) 1 g in dextrose 5 % 50 mL IVPB  1 g Intravenous Q24H Caren Griffins, MD   1 g at 04/22/13 1405  . diltiazem (CARDIZEM CD) 24 hr capsule 240 mg  240 mg Oral Daily Peter M Martinique, MD      . docusate sodium (COLACE) capsule 100 mg  100 mg Oral BID PRN Caren Griffins, MD      . feeding supplement (ENSURE COMPLETE) (ENSURE COMPLETE) liquid 237 mL  237 mL Oral BID BM Caren Griffins, MD   237 mL at 04/22/13 1405  . finasteride (PROSCAR) tablet 5 mg  5 mg Oral q morning - 10a Costin Karlyne Greenspan, MD   5 mg at 04/22/13 6010  . furosemide (LASIX) tablet 40 mg  40 mg Oral Daily Peter M Martinique, MD      . pantoprazole (PROTONIX) EC tablet 40 mg  40 mg Oral Daily PRN Caren Griffins, MD      . simvastatin (ZOCOR) tablet 10 mg  10 mg Oral QHS Caren Griffins, MD   10 mg at 04/22/13 2121  . sodium chloride 0.9 % injection 3 mL  3 mL Intravenous Q12H Caren Griffins, MD   3 mL at 04/22/13 2121  . sodium chloride irrigation 0.9 % 3,000 mL  3,000 mL Irrigation Continuous Molli Hazard, MD      . terazosin (HYTRIN) capsule 5 mg  5 mg Oral QHS Caren Griffins, MD   5 mg at 04/22/13 2121  . traMADol (ULTRAM) tablet 50 mg  50 mg Oral Q6H PRN Costin Karlyne Greenspan, MD      . Warfarin - Pharmacist Dosing Inpatient   Does not apply Newell, MD         Objective: Vital signs in last 24 hours: Temp:  [97.6 F (36.4 C)-98 F (36.7 C)] 97.6 F (36.4 C) (04/15 0534) Pulse Rate:  [74-102] 102 (04/15 0534) Resp:  [16-18] 18 (04/15 0534) BP: (100-119)/(52-68) 100/52 mmHg (04/15 0534) SpO2:  [98 %-100 %] 98 % (04/15 0534) Weight:  [71.1 kg (156 lb 12 oz)] 71.1 kg (156 lb 12 oz) (  04/15 0534)  Intake/Output from previous day: 04/14 0701 - 04/15 0700 In: 1060 [P.O.:960; IV Piggyback:100] Out: 2001 [Urine:2000; Stool:1] Intake/Output this shift: Total I/O In: 240 [P.O.:240] Out: -    Physical Exam  Genitourinary:  Urine is clear.     Lab Results:   Recent Labs  04/22/13 0447 04/23/13 0410  WBC 9.9 8.5  HGB 9.4* 9.8*  HCT 29.9* 29.6*  PLT 192 208   BMET  Recent Labs  04/21/13 0436 04/22/13 0447  NA 141 142  K 4.0 3.8  CL 106 104  CO2 24 26  GLUCOSE 105* 107*  BUN 32* 39*  CREATININE 1.27 1.28  CALCIUM 8.7 9.3   PT/INR  Recent Labs  04/22/13 0447 04/23/13 0410  LABPROT 29.8* 25.6*  INR 2.96* 2.43*   ABG No results found for this basename: PHART, PCO2, PO2, HCO3,  in the last 72 hours  Studies/Results: No results found.   Assessment: He remains in retention but without hematuria.   Plan: He will need to go home with the foley. I will arrange BCG therapy and keep the foley for the duration of the 6 week treatment cycle and then reassess his voiding function.      LOS: 3 days     Irine Seal 04/23/2013

## 2013-04-23 NOTE — Progress Notes (Signed)
TELEMETRY: Reviewed telemetry pt in atrial fibrillation with controlled ventricular response in the 70s: Filed Vitals:   04/22/13 0541 04/22/13 1500 04/22/13 2022 04/23/13 0534  BP: 110/51 119/68 111/64 100/52  Pulse: 77 74 78 102  Temp: 98.3 F (36.8 C) 98 F (36.7 C) 97.6 F (36.4 C) 97.6 F (36.4 C)  TempSrc: Oral Oral Oral Oral  Resp: 18 16 18 18   Height:      Weight:    156 lb 12 oz (71.1 kg)  SpO2: 99% 100% 100% 98%    Intake/Output Summary (Last 24 hours) at 04/23/13 0810 Last data filed at 04/23/13 0756  Gross per 24 hour  Intake   1300 ml  Output   2001 ml  Net   -701 ml    SUBJECTIVE Feeling better. Denies SOB or chest pain. Failed foley removal yesterday and had it replaced.  LABS: Basic Metabolic Panel:  Recent Labs  04/20/13 1106 04/21/13 0436 04/22/13 0447  NA 138 141 142  K 4.4 4.0 3.8  CL 104 106 104  CO2 20 24 26   GLUCOSE 131* 105* 107*  BUN 35* 32* 39*  CREATININE 1.35 1.27 1.28  CALCIUM 8.8 8.7 9.3  MG 1.8  --   --    Liver Function Tests: No results found for this basename: AST, ALT, ALKPHOS, BILITOT, PROT, ALBUMIN,  in the last 72 hours No results found for this basename: LIPASE, AMYLASE,  in the last 72 hours CBC:  Recent Labs  04/20/13 1106  04/22/13 0447 04/23/13 0410  WBC 12.4*  < > 9.9 8.5  NEUTROABS 10.0*  --   --   --   HGB 11.0*  < > 9.4* 9.8*  HCT 35.1*  < > 29.9* 29.6*  MCV 92.9  < > 92.9 91.1  PLT 174  < > 192 208  < > = values in this interval not displayed. Cardiac Enzymes: No results found for this basename: CKTOTAL, CKMB, CKMBINDEX, TROPONINI,  in the last 72 hours BNP: 5284  Radiology/Studies:  Dg Chest 2 View  04/20/2013   CLINICAL DATA:  Shortness of breath, weakness, cough  EXAM: CHEST  2 VIEW  COMPARISON:  Chest and abdomen radiograph 02/27/2013  FINDINGS: There changes of median sternotomy. Cardiomegaly is stable. Thoracic aorta atherosclerotic calcification. Pulmonary vascularity is mildly congested.  There is slight chronic prominence of the interstitium. No airspace disease, pleural effusion, or pneumothorax.  Small hiatal hernia.  Bones are diffusely osteopenic. No acute osseous abnormality identified.  IMPRESSION: Cardiomegaly and mild pulmonary vascular congestion. Slight diffuse interstitial prominence may be due to chronic interstitial thickening, or mild interstitial pulmonary edema.  Small hiatal hernia.   Electronically Signed   By: Curlene Dolphin M.D.   On: 04/20/2013 12:17   Ecg: afib, LAD, old anterior infarct.  Echo 03/03/12 Study Conclusions  - Left ventricle: The cavity size was normal. Wall thickness was normal. Systolic function was mildly reduced. The estimated ejection fraction was in the range of 45% to 50%. Diffuse hypokinesis. Indeterminant diastolic function (atrial fibrillation). - Aortic valve: St Jude mechanical aortic valve. No significant prosthetic valve regurgitation or stenosis. Mean gradient: 75mm Hg (S). - Mitral valve: Moderately calcified annulus. Moderately calcified leaflets . Mild to moderate regurgitation. - Left atrium: The atrium was mildly to moderately dilated. - Right ventricle: The cavity size was mildly dilated. Systolic function was normal. - Right atrium: The atrium was moderately dilated. - Tricuspid valve: Peak RV-RA gradient: 21mm Hg (S). Maximal regurgitant velocity: 292cm/s. -  Pulmonary arteries: PA peak pressure: 15mm Hg (S). - Systemic veins: IVC measured 2.3 cm with < 50% respirophasic variation, suggesting RA pressure 15 mmHg. Impressions:  - The patient was in atrial fibrillation. Normal LV size with EF 45-50%, mild diffuse hypokinesis. Mild to moderate MR. St Jude mechanical aortic valve appeared to function normally. Mildly dilated RV with normal systolic function. Moderate pulmonary hypertension.   PHYSICAL EXAM General: Well developed, elderly, in no acute distress. Head: Normocephalic, atraumatic, sclera  non-icteric, no xanthomas, nares are without discharge. Neck: Negative for carotid bruits. JVD not elevated. Lungs: Clear. Breathing is unlabored. Heart: IRRR S1 S2 without murmurs, rubs, or gallops.  Abdomen: Soft, non-tender, non-distended with normoactive bowel sounds.  No obvious abdominal masses. Extremities: No clubbing, cyanosis or edema.  Distal pedal pulses are 1+ and equal bilaterally. Neuro: Alert and oriented X 3. Moves all extremities spontaneously. Psych:  Responds to questions appropriately with a normal affect.  ASSESSMENT AND PLAN: 1. Atrial fibrillation with RVR. Rate now well controlled on po cardizem and increased coreg dose. Will switch cardizem to long acting formulation today. 2. Anticoagulation on coumadin 3. Acute on chronic combined systolic and diastolic CHF. Weight stable. Appears euvolemic on oral lasix. 4. S/p AVR with St. Jude prosthesis.  5. HTN   Principal Problem:   Atrial fibrillation with RVR Active Problems:   Chronic atrial fibrillation   Chronic diastolic congestive heart failure- last EF 45-50%   S/P AVR (aortic valve replacement)   Bladder cancer- s/p surgery 02/20/13   UTI (urinary tract infection)   Anemia of chronic disease   Chronic anticoagulation   Protein-calorie malnutrition, severe   Hematuria   Urinary obstruction   Acute on chronic combined systolic and diastolic congestive heart failure    Signed, Samirah Scarpati M Martinique MD,FACC 04/23/2013 8:10 AM

## 2013-04-23 NOTE — Care Management Note (Signed)
    Page 1 of 1   04/23/2013     1:58:05 PM   CARE MANAGEMENT NOTE 04/23/2013  Patient:  AMALIO, LOE   Account Number:  [id number]  Date Initiated:  04/23/2013  Documentation initiated by:  Dessa Phi  Subjective/Objective Assessment:   80 Y/O M ADMITTED W/AFIB W/RVR.     Action/Plan:   FROM HOME.ACTIVE [facility] HHRN/PT.   Anticipated DC Date:  04/23/2013   Anticipated DC Plan:  [place]  CM consult      [facility] Choice  Resumption Of Svcs/PTA Provider   Choice offered to / List presented to:  [name] Patient        [place] arranged  HH-1 RN  [place].   Status of service:  Completed, signed off Medicare Important Message given?   (If response is "NO", the following Medicare IM given date fields will be blank) Date Medicare IM given:   Date Additional Medicare IM given:    Discharge Disposition:  [place]  Per UR Regulation:  Reviewed for med. necessity/level of care/duration of stay  If discussed at [place] of Stay Meetings, dates discussed:    Comments:  04/23/13 Jovan Schickling RN,BSN NCM [address] [place] D/C HOME W/HHRN-PT/INR [place] PCP W/RESULTS.HHPT/AIDE.PATIENT D/C WITH F/C.

## 2013-04-23 NOTE — Discharge Summary (Signed)
Physician Discharge Summary  RY MOODY UTM:[id number] DOB: 08-Dec-1919 DOA: 04/20/2013  PCP: [name], NP  Admit date: 04/20/2013 Discharge date: 04/23/2013  Recommendations for Outpatient Follow-up:  1. Pt will need to follow up with PCP in 2-3 weeks post discharge 2. Please obtain BMP to evaluate electrolytes and kidney function 3. Please also check CBC to evaluate Hg and Hct levels 4. Per urology recommendation, pt will go home with foley   Discharge Diagnoses:  Principal Problem:   Atrial fibrillation with RVR Active Problems:   UTI (urinary tract infection)   Chronic atrial fibrillation   Chronic diastolic congestive heart failure- last EF 45-50%   Bladder cancer- s/p surgery 02/20/13   Anemia of chronic disease   S/P AVR (aortic valve replacement)   Chronic anticoagulation   Protein-calorie malnutrition, severe   Hematuria   Urinary obstruction   Acute on chronic combined systolic and diastolic congestive heart failure  Discharge Condition: Stable  Diet recommendation: Heart healthy diet discussed in details   History of present illness:  80 y.o. male has a past medical history significant for A fib, AVR on chronic anticoagulation, chronic diastolic congestive heart failure, BPH, bladder cancer s/p surgery 02/20/13, followed by Dr. [name], most recently seen 2 days PTA when his Foley catheter was changed, presented to the ED with decreased UOP and lower abdominal pain. Patient's wife states that she last emptied patient's urine bag about 8 PM night PTA, and the am of admission there was minimal amount of urine in his bag. Patient was complaining of lower abdominal pain and discomfort. Patient denied any chest pain or palpitations. He denied any shortness or breath, he has chronic shortness of breath which has not changed. He has no nausea or vomiting or diarrhea. He was brought into the emergency room where the bladder scan showed large amount of urine. In the ED his  Foley was changed to a three-way continuous bladder irrigation. Dr. Jasmine December from urology has been consulted, hospitalist was asked for admission. On arrival into the emergency room, patient was found to have atrial fibrillation rapid ventricular response, and was started on a diltiazem drip.   Hospital Course:  Urinary retention - patient was admitted 4/12 and overnight for first night underwent continuous bladder irrigation. His Foley has been changed back to a regular one 4/13 am and per urology removed completely 4/14, foley had to be placed back in as pt failed voiding trial  Possible UTI - patient with mild leukocytosis, tachycardia, and pyuria on admission. Started empiric ceftriaxone. Therapy completed  Atrial fibrillation with RVR - patient on a Cardizem drip initially, successfully transitioned to oral Cardizem - Cardiology following, appreciate input. Continue coumadin  Status post AVR on chronic anticoagulation - patient is on chronic Coumadin, continue that. INR therapeutic. No more hematuria noted in the bag.  Acute on chronic diastolic congestive heart failure - last EF 45-50% In February of 2015. Pt euvolemic  Bladder cancer - urology following.  Anemia of chronic disease - hemoglobin stable, monitor  Diet: Heart healthy  Fluids: None  DVT Prophylaxis: Coumadin  Code Status: DO NOT RESUSCITATE   Procedures/Studies:  CXR  04/20/2013   Cardiomegaly, mild pulmonary vascular congestion. Slight diffuse interstitial prominence, ? chronic interstitial thickening/mild interstitial pulmonary edema Consultations:  Cardiology  Urology  Antibiotics:  None  Discharge Exam: Filed Vitals:   04/23/13 0534  BP: 100/52  Pulse: 102  Temp: 97.6 F (36.4 C)  Resp: 18   Filed Vitals:   04/22/13 0541  04/22/13 1500 04/22/13 2022 04/23/13 0534  BP: 110/51 119/68 111/64 100/52  Pulse: 77 74 78 102  Temp: 98.3 F (36.8 C) 98 F (36.7 C) 97.6 F (36.4 C) 97.6 F (36.4 C)  TempSrc:  Oral Oral Oral Oral  Resp: 18 16 18 18   Height:      Weight:    71.1 kg (156 lb 12 oz)  SpO2: 99% 100% 100% 98%    General: Pt is alert, follows commands appropriately, not in acute distress Cardiovascular: Irregular rate and rhythm, no rubs, no gallops Respiratory: Clear to auscultation bilaterally, no wheezing, no crackles, no rhonchi Abdominal: Soft, non tender, non distended, bowel sounds +, no guarding Extremities: no edema, no cyanosis, pulses palpable bilaterally DP and PT Neuro: Grossly nonfocal  Discharge Instructions  Discharge Orders   Future Appointments Provider Department Dept Phone   04/25/2013 9:30 AM Rosalio Macadamia, NP [facility] [place] Office [phone]   04/25/2013 9:45 AM [facility] [place] Office [phone]   Future Orders Complete By Expires   Diet - low sodium heart healthy  As directed    Increase activity slowly  As directed        Medication List         carvedilol 6.25 MG tablet  Commonly known as:  COREG  Take 6.25 mg by mouth 2 (two) times daily with a meal.     diltiazem 240 MG 24 hr capsule  Commonly known as:  CARDIZEM CD  Take 1 capsule (240 mg total) by mouth daily.     DSS 100 MG Caps  Take 100 mg by mouth 2 (two) times daily as needed for mild constipation.     feeding supplement (ENSURE COMPLETE) Liqd  Take 237 mLs by mouth 2 (two) times daily between meals.     finasteride 5 MG tablet  Commonly known as:  PROSCAR  Take 5 mg by mouth every morning.     furosemide 40 MG tablet  Commonly known as:  LASIX  Take 1 tablet (40 mg total) by mouth daily. Take only as needed - for increased swelling or weight gain of 3 pounds overnight     pantoprazole 40 MG tablet  Commonly known as:  PROTONIX  Take 40 mg by mouth daily as needed (For acid reflex).     potassium chloride 20 MEQ packet  Commonly known as:  KLOR-CON  Take 10 mEq by mouth daily.     simvastatin 10 MG tablet  Commonly known  as:  ZOCOR  Take 10 mg by mouth at bedtime.     terazosin 5 MG capsule  Commonly known as:  HYTRIN  Take 5 mg by mouth at bedtime.     traMADol 50 MG tablet  Commonly known as:  ULTRAM  Take 50 mg by mouth every 6 (six) hours as needed for moderate pain.     warfarin 5 MG tablet  Commonly known as:  COUMADIN  2.5-5 mg daily at 6 PM. Beginning Tues, 03/11/13, take 1/2 tab (2.5mg ) once daily except every 4th day take 1 tab (5mg ) once daily.           Follow-up Information   Schedule an appointment as soon as possible for a visit with Norma Fredrickson, NP.   Specialty:  Nurse Practitioner   Contact information:   [address] [place] [address] [phone]       Schedule an appointment as soon as possible for a  visit with [name], MD.   Specialty:  Urology   Contact information:   [facility]  [place] [address] [phone]       Schedule an appointment as soon as possible for a visit with Peter Martinique, MD.   Specialty:  Cardiology   Contact information:   [address] [place] [address] [phone]       Follow up with Faye Ramsay, MD. (As needed, If symptoms worsen call my cell phone with questions [phone])    Specialty:  Internal Medicine   Contact information:   [address] [place] [address] [phone]        The results of significant diagnostics from this hospitalization (including imaging, microbiology, ancillary and laboratory) are listed below for reference.     Microbiology: Recent Results (from the past 240 hour(s))  URINE CULTURE     Status: None   Collection Time    04/20/13 11:47 AM      Result Value Ref Range Status   Specimen Description URINE, CATHETERIZED   Final   Special Requests NONE   Final   Culture  Setup Time     Final   Value: 04/20/2013 17:49     Performed at [facility]     Final   Value: NO  GROWTH     Performed at [facility]   Culture     Final   Value: NO GROWTH     Performed at [facility]   Report Status 04/21/2013 FINAL   Final  CULTURE, BLOOD (ROUTINE X 2)     Status: None   Collection Time    04/20/13  1:50 PM      Result Value Ref Range Status   Specimen Description BLOOD RIGHT WRIST  3 ML IN [facility] BOTTLE   Final   Special Requests NONE   Final   Culture  Setup Time     Final   Value: 04/20/2013 17:45     Performed at [facility]   Culture     Final   Value:        BLOOD CULTURE RECEIVED NO GROWTH TO DATE CULTURE WILL BE HELD FOR 5 DAYS BEFORE ISSUING A FINAL NEGATIVE REPORT     Performed at [facility]   Report Status PENDING   Incomplete  CULTURE, BLOOD (ROUTINE X 2)     Status: None   Collection Time    04/20/13  2:10 PM      Result Value Ref Range Status   Specimen Description BLOOD LEFT FOREARM  5 ML IN [facility] BOTTLE   Final   Special Requests NONE   Final   Culture  Setup Time     Final   Value: 04/20/2013 17:45     Performed at [facility]   Culture     Final   Value:        BLOOD CULTURE RECEIVED NO GROWTH TO DATE CULTURE WILL BE HELD FOR 5 DAYS BEFORE ISSUING A FINAL NEGATIVE REPORT     Performed at [facility]   Report Status PENDING   Incomplete     Labs: Basic Metabolic Panel:  Recent Labs Lab 04/20/13 1106 04/21/13 0436 04/22/13 0447  NA 138 141 142  K 4.4 4.0 3.8  CL 104 106 104  CO2 20 24 26   GLUCOSE 131* 105* 107*  BUN 35* 32*  39*  CREATININE 1.35 1.27 1.28  CALCIUM 8.8 8.7 9.3  MG 1.8  --   --    Liver Function Tests: No results found for this basename: AST, ALT, ALKPHOS, BILITOT, PROT, ALBUMIN,  in the last 168 hours No results found for this basename: LIPASE, AMYLASE,  in the last 168 hours No results found for this basename: AMMONIA,  in the last 168 hours CBC:  Recent Labs Lab 04/20/13 1106 04/21/13 0436 04/22/13 0447 04/23/13 0410  WBC 12.4* 9.9 9.9 8.5   NEUTROABS 10.0*  --   --   --   HGB 11.0* 9.9* 9.4* 9.8*  HCT 35.1* 31.6* 29.9* 29.6*  MCV 92.9 92.9 92.9 91.1  PLT 174 194 192 208   Cardiac Enzymes: No results found for this basename: CKTOTAL, CKMB, CKMBINDEX, TROPONINI,  in the last 168 hours BNP: BNP (last 3 results)  Recent Labs  04/20/13 1106  PROBNP 5284.0*   CBG: No results found for this basename: GLUCAP,  in the last 168 hours   SIGNED: Time coordinating discharge: Over 30 minutes  Theodis Blaze, MD  [facility] 04/23/2013, 12:24 PM Pager [phone]  If 7PM-7AM, please contact night-coverage [email or web address] Password [email or web address]

## 2013-04-24 DIAGNOSIS — I5043 Acute on chronic combined systolic (congestive) and diastolic (congestive) heart failure: Secondary | ICD-10-CM | POA: Diagnosis not present

## 2013-04-24 DIAGNOSIS — N189 Chronic kidney disease, unspecified: Secondary | ICD-10-CM | POA: Diagnosis not present

## 2013-04-24 DIAGNOSIS — D631 Anemia in chronic kidney disease: Secondary | ICD-10-CM | POA: Diagnosis not present

## 2013-04-24 DIAGNOSIS — I4891 Unspecified atrial fibrillation: Secondary | ICD-10-CM | POA: Diagnosis not present

## 2013-04-24 DIAGNOSIS — C679 Malignant neoplasm of bladder, unspecified: Secondary | ICD-10-CM | POA: Diagnosis not present

## 2013-04-24 DIAGNOSIS — I509 Heart failure, unspecified: Secondary | ICD-10-CM | POA: Diagnosis not present

## 2013-04-25 ENCOUNTER — Ambulatory Visit (INDEPENDENT_AMBULATORY_CARE_PROVIDER_SITE_OTHER): Payer: Medicare Other | Admitting: Nurse Practitioner

## 2013-04-25 ENCOUNTER — Ambulatory Visit (INDEPENDENT_AMBULATORY_CARE_PROVIDER_SITE_OTHER): Payer: Medicare Other | Admitting: Pharmacist

## 2013-04-25 ENCOUNTER — Encounter: Payer: Self-pay | Admitting: Nurse Practitioner

## 2013-04-25 VITALS — BP 130/60 | HR 92 | Ht 69.0 in | Wt 162.8 lb

## 2013-04-25 DIAGNOSIS — Z954 Presence of other heart-valve replacement: Secondary | ICD-10-CM | POA: Diagnosis not present

## 2013-04-25 DIAGNOSIS — I482 Chronic atrial fibrillation, unspecified: Secondary | ICD-10-CM

## 2013-04-25 DIAGNOSIS — Z7901 Long term (current) use of anticoagulants: Secondary | ICD-10-CM | POA: Diagnosis not present

## 2013-04-25 DIAGNOSIS — I4891 Unspecified atrial fibrillation: Secondary | ICD-10-CM

## 2013-04-25 DIAGNOSIS — C679 Malignant neoplasm of bladder, unspecified: Secondary | ICD-10-CM | POA: Diagnosis not present

## 2013-04-25 DIAGNOSIS — D631 Anemia in chronic kidney disease: Secondary | ICD-10-CM | POA: Diagnosis not present

## 2013-04-25 DIAGNOSIS — I5022 Chronic systolic (congestive) heart failure: Secondary | ICD-10-CM

## 2013-04-25 DIAGNOSIS — N189 Chronic kidney disease, unspecified: Secondary | ICD-10-CM | POA: Diagnosis not present

## 2013-04-25 DIAGNOSIS — I509 Heart failure, unspecified: Secondary | ICD-10-CM | POA: Diagnosis not present

## 2013-04-25 DIAGNOSIS — Z952 Presence of prosthetic heart valve: Secondary | ICD-10-CM

## 2013-04-25 DIAGNOSIS — I5043 Acute on chronic combined systolic (congestive) and diastolic (congestive) heart failure: Secondary | ICD-10-CM | POA: Diagnosis not present

## 2013-04-25 LAB — POCT INR: INR: 1.6

## 2013-04-25 LAB — BASIC METABOLIC PANEL
BUN: 39 mg/dL — ABNORMAL HIGH (ref 6–23)
CO2: 27 mEq/L (ref 19–32)
Calcium: 8.7 mg/dL (ref 8.4–10.5)
Chloride: 106 mEq/L (ref 96–112)
Creatinine, Ser: 1.4 mg/dL (ref 0.4–1.5)
GFR: 51.71 mL/min — ABNORMAL LOW (ref >60.00)
Glucose, Bld: 155 mg/dL — ABNORMAL HIGH (ref 70–99)
Potassium: 4 mEq/L (ref 3.5–5.1)
Sodium: 141 mEq/L (ref 135–145)

## 2013-04-25 LAB — CBC
HCT: 33 % — ABNORMAL LOW (ref 39.0–52.0)
Hemoglobin: 10.9 g/dL — ABNORMAL LOW (ref 13.0–17.0)
MCHC: 32.9 g/dL (ref 30.0–36.0)
MCV: 91 fl (ref 78.0–100.0)
Platelets: 210 10*3/uL (ref 150.0–400.0)
RBC: 3.63 Mil/uL — ABNORMAL LOW (ref 4.22–5.81)
RDW: 17.1 % — ABNORMAL HIGH (ref 11.5–14.6)
WBC: 9.2 10*3/uL (ref 4.5–10.5)

## 2013-04-25 NOTE — Progress Notes (Signed)
Eric Wheeler Date of Birth: [birth year] Medical Record #[id number]  History of Present Illness: Eric Wheeler is seen back today for a follow up/post hospital visit. Seen for Dr. Percival Wheeler - who has not yet met - former patient of Dr. Susa Wheeler. He is 80 years of age. He has chronic systolic HF with EF of 45 to 50% (per echo 02/2013) with prior AVR in 1987, HTN and in chronic atrial fib - managed with rate control and coumadin. He is intolerant to ACE due to dizziness.   Most recently has had surgery for a large bladder tumor - complicated by hematuria, obstruction, renal failure, AF with RVR - he has been back to the hospital several times.   Comes back today. Here with wife and daughter. Just discharged from [facility] again this week. This is the 4th admission since February. This last admission was for urinary retention - associated with AF with RVR. Just discharged Wednesday. Now considering "treatment" for residual tumor. Daughter is wanting to not proceed. Apparently they have been told that he will feel "sick". He is considering no further treatment. No chest pain. Breathing ok. Walking short distances but still deconditioned. Has home health now and daughter is hoping that they will be able to help with the foley changes. Using lasix just prn.   Current Outpatient Prescriptions  Medication Sig Dispense Refill  . carvedilol (COREG) 6.25 MG tablet Take 6.25 mg by mouth 2 (two) times daily with a meal.      . diltiazem (CARDIZEM CD) 240 MG 24 hr capsule Take 1 capsule (240 mg total) by mouth daily.  30 capsule  3  . feeding supplement, ENSURE COMPLETE, (ENSURE COMPLETE) LIQD Take 237 mLs by mouth 2 (two) times daily between meals.  10 Bottle  0  . finasteride (PROSCAR) 5 MG tablet Take 5 mg by mouth every morning.       . simvastatin (ZOCOR) 10 MG tablet Take 10 mg by mouth at bedtime.      [name] terazosin (HYTRIN) 5 MG capsule Take 5 mg by mouth at bedtime.        [name] warfarin (COUMADIN) 5 MG tablet  2.5-5 mg daily at 6 PM. Beginning Tues, 03/11/13, take 1/2 tab (2.$RemoveBe'5mg'iAHEVdWvC$ ) once daily except every 4th day take 1 tab ($Remo'5mg'DCXQP$ ) once daily.      . furosemide (LASIX) 40 MG tablet Take 1 tablet (40 mg total) by mouth daily. Take only as needed - for increased swelling or weight gain of 3 pounds overnight  30 tablet  3  . potassium chloride (KLOR-CON) 20 MEQ packet Take 10 mEq by mouth daily.  30 tablet  0   No current facility-administered medications for this visit.    Allergies  Allergen Reactions  . Ace Inhibitors Other (See Comments)    Dizziness   . Codeine Nausea And Vomiting  . Sulfa Drugs Cross Reactors Nausea And Vomiting    Past Medical History  Diagnosis Date  . Chronic atrial fibrillation   . Chronic anticoagulation     a. afib/st. jude avr - goal 2.5-3.5   . BPH (benign prostatic hypertrophy)   . Hypertension   . History of kidney stones   . Gout   . Syncope   . History of skin cancer   . Bladder tumor     a. s/p resection 02/19/2013.  [name] Hx of scarlet fever   . Seizures 2014    evaluated by Dr. Jannifer Wheeler at [facility] neurology -  no cause determined -  refered back to cardiologist - recommended loop recorder to monitor rythm but pt refused - continues to have seizures per family - notes in EPIC  . Chronic combined systolic and diastolic CHF (congestive heart failure)     a. EF 45 to 50% per echo in 2011 following exacerbation  . Chronic kidney disease     Past Surgical History  Procedure Laterality Date  . Aortic valve replacement  1987    #21 MM ST JUDE PLACED BY DR.CHARLES Wheeler  . Cataract extraction    . Transurethral resection of prostate N/A 02/19/2013    Procedure: TRANSURETHRAL RESECTION OF THE PROSTATE /bladder WITH GYRUS INSTRUMENTS;  Surgeon: Eric Seal, MD;  Location: [facility];  Service: Urology;  Laterality: N/A;  . Cystoscopy N/A 02/19/2013    Procedure: CYSTOSCOPY;  Surgeon: Eric Seal, MD;  Location: [facility];  Service: Urology;  Laterality: N/A;    History    Smoking status  . Former Smoker  . Quit date: 01/10/1971  Smokeless tobacco  . Never Used    History  Alcohol Use No    Family History  Problem Relation Age of Onset  . Cancer Mother   . Heart disease Father   . Asthma Father   . Stroke Father   . Dementia Sister   . Heart disease Brother     Review of Systems: The review of systems is per the HPI.  All other systems were reviewed and are negative.  Physical Exam: BP 130/60  Pulse 92  Ht $R'5\' 9"'CK$  (1.753 m)  Wt 162 lb 12.8 oz (73.846 kg)  BMI 24.03 kg/m2  SpO2 100% Patient is an elderly male who is very pleasant and in no acute distress. He looks frail. Skin is warm and dry. Color is normal.  HEENT is unremarkable. Normocephalic/atraumatic. PERRL. Sclera are nonicteric. Neck is supple. No masses. No JVD. Lungs are clear. Cardiac exam shows an irregular rhythm. Rate ok. Abdomen is soft. Extremities are without edema. Using a walker. Gait and ROM are intact. No gross neurologic deficits noted.  Wt Readings from Last 3 Encounters:  04/25/13 162 lb 12.8 oz (73.846 kg)  04/23/13 156 lb 12 oz (71.1 kg)  03/21/13 159 lb 12.8 oz (72.485 kg)     LABORATORY DATA: EKG today shows atrial fib with a rate of 92.   Lab Results  Component Value Date   WBC 8.5 04/23/2013   HGB 9.8* 04/23/2013   HCT 29.6* 04/23/2013   PLT 208 04/23/2013   GLUCOSE 107* 04/22/2013   CHOL 148 09/20/2012   TRIG 97.0 09/20/2012   HDL 33.50* 09/20/2012   LDLCALC 95 09/20/2012   ALT 11 02/28/2013   AST 9 02/28/2013   NA 142 04/22/2013   K 3.8 04/22/2013   CL 104 04/22/2013   CREATININE 1.28 04/22/2013   BUN 39* 04/22/2013   CO2 26 04/22/2013   TSH 2.843 02/27/2013   INR 2.43* 04/23/2013   Echo Study Conclusions from February 2015  - Left ventricle: The cavity size was normal. Wall thickness was normal. Systolic function was mildly reduced. The estimated ejection fraction was in the range of 45% to 50%. Diffuse hypokinesis. Indeterminant diastolic  function (atrial fibrillation). - Aortic valve: St Jude mechanical aortic valve. No significant prosthetic valve regurgitation or stenosis. Mean gradient: 40mm Hg (S). - Mitral valve: Moderately calcified annulus. Moderately calcified leaflets . Mild to moderate regurgitation. - Left atrium: The atrium was mildly to moderately dilated. - Right ventricle: The cavity size was mildly  dilated. Systolic function was normal. - Right atrium: The atrium was moderately dilated. - Tricuspid valve: Peak RV-RA gradient: 55mm Hg (S). Maximal regurgitant velocity: 292cm/s. - Pulmonary arteries: PA peak pressure: 60mm Hg (S). - Systemic veins: IVC measured 2.3 cm with < 50% respirophasic variation, suggesting RA pressure 15 mmHg. Impressions:  - The patient was in atrial fibrillation. Normal LV size with EF 45-50%, mild diffuse hypokinesis. Mild to moderate MR. St Jude mechanical aortic valve appeared to function normally. Mildly dilated RV with normal systolic function. Moderate pulmonary hypertension.    Assessment / Plan: 1. Chronic atrial fib - rate fair today - he is asymptomatic. I have left him on his current regimen.   2. Recent resection of large bladder tumor - complicated by renal failure, UTI, urinary retention - sees Dr. [name] - he is considering no further therapy for his bladder cancer. I do not see where he has much, if any, reserve for continued complications.   3. Prior AVR  4. Systolic HF - using lasix prn.   5. Advanced age  We will recheck his lab today. Check INR today. See again in a month.   Patient is agreeable to this plan and will call if any problems develop in the interim.   Burtis Junes, RN, [place] [address] [place], [place]  [address] [phone]

## 2013-04-25 NOTE — Patient Instructions (Signed)
Stay on your current medicines  See me in a month   We will recheck lab today  Call the [place] office at [phone] if you have any questions, problems or concerns.

## 2013-04-26 LAB — CULTURE, BLOOD (ROUTINE X 2)
CULTURE: NO GROWTH
Culture: NO GROWTH

## 2013-04-28 DIAGNOSIS — I4891 Unspecified atrial fibrillation: Secondary | ICD-10-CM | POA: Diagnosis not present

## 2013-04-28 DIAGNOSIS — I509 Heart failure, unspecified: Secondary | ICD-10-CM | POA: Diagnosis not present

## 2013-04-28 DIAGNOSIS — N189 Chronic kidney disease, unspecified: Secondary | ICD-10-CM | POA: Diagnosis not present

## 2013-04-28 DIAGNOSIS — D631 Anemia in chronic kidney disease: Secondary | ICD-10-CM | POA: Diagnosis not present

## 2013-04-28 DIAGNOSIS — C679 Malignant neoplasm of bladder, unspecified: Secondary | ICD-10-CM | POA: Diagnosis not present

## 2013-04-28 DIAGNOSIS — I5043 Acute on chronic combined systolic (congestive) and diastolic (congestive) heart failure: Secondary | ICD-10-CM | POA: Diagnosis not present

## 2013-04-28 DIAGNOSIS — N039 Chronic nephritic syndrome with unspecified morphologic changes: Secondary | ICD-10-CM | POA: Diagnosis not present

## 2013-04-29 DIAGNOSIS — R3989 Other symptoms and signs involving the genitourinary system: Secondary | ICD-10-CM | POA: Diagnosis not present

## 2013-04-29 DIAGNOSIS — S3730XA Unspecified injury of urethra, initial encounter: Secondary | ICD-10-CM | POA: Diagnosis not present

## 2013-04-29 DIAGNOSIS — R339 Retention of urine, unspecified: Secondary | ICD-10-CM | POA: Diagnosis not present

## 2013-04-29 DIAGNOSIS — N501 Vascular disorders of male genital organs: Secondary | ICD-10-CM | POA: Diagnosis not present

## 2013-04-29 DIAGNOSIS — S39848A Other specified injuries of external genitals, initial encounter: Secondary | ICD-10-CM | POA: Diagnosis not present

## 2013-04-29 DIAGNOSIS — R310 Gross hematuria: Secondary | ICD-10-CM | POA: Diagnosis not present

## 2013-04-29 DIAGNOSIS — S3720XA Unspecified injury of bladder, initial encounter: Secondary | ICD-10-CM | POA: Diagnosis not present

## 2013-04-29 DIAGNOSIS — S3994XA Unspecified injury of external genitals, initial encounter: Secondary | ICD-10-CM | POA: Diagnosis not present

## 2013-04-29 DIAGNOSIS — C679 Malignant neoplasm of bladder, unspecified: Secondary | ICD-10-CM | POA: Diagnosis not present

## 2013-04-30 DIAGNOSIS — C679 Malignant neoplasm of bladder, unspecified: Secondary | ICD-10-CM | POA: Diagnosis not present

## 2013-04-30 DIAGNOSIS — I509 Heart failure, unspecified: Secondary | ICD-10-CM | POA: Diagnosis not present

## 2013-04-30 DIAGNOSIS — I5043 Acute on chronic combined systolic (congestive) and diastolic (congestive) heart failure: Secondary | ICD-10-CM | POA: Diagnosis not present

## 2013-04-30 DIAGNOSIS — N189 Chronic kidney disease, unspecified: Secondary | ICD-10-CM | POA: Diagnosis not present

## 2013-04-30 DIAGNOSIS — N039 Chronic nephritic syndrome with unspecified morphologic changes: Secondary | ICD-10-CM | POA: Diagnosis not present

## 2013-04-30 DIAGNOSIS — D631 Anemia in chronic kidney disease: Secondary | ICD-10-CM | POA: Diagnosis not present

## 2013-04-30 DIAGNOSIS — I4891 Unspecified atrial fibrillation: Secondary | ICD-10-CM | POA: Diagnosis not present

## 2013-05-02 ENCOUNTER — Ambulatory Visit (INDEPENDENT_AMBULATORY_CARE_PROVIDER_SITE_OTHER): Payer: Medicare Other | Admitting: Pharmacist

## 2013-05-02 DIAGNOSIS — Z954 Presence of other heart-valve replacement: Secondary | ICD-10-CM

## 2013-05-02 DIAGNOSIS — I4891 Unspecified atrial fibrillation: Secondary | ICD-10-CM | POA: Diagnosis not present

## 2013-05-02 DIAGNOSIS — Z952 Presence of prosthetic heart valve: Secondary | ICD-10-CM

## 2013-05-02 DIAGNOSIS — Z7901 Long term (current) use of anticoagulants: Secondary | ICD-10-CM

## 2013-05-02 DIAGNOSIS — D631 Anemia in chronic kidney disease: Secondary | ICD-10-CM | POA: Diagnosis not present

## 2013-05-02 DIAGNOSIS — I482 Chronic atrial fibrillation, unspecified: Secondary | ICD-10-CM

## 2013-05-02 DIAGNOSIS — I509 Heart failure, unspecified: Secondary | ICD-10-CM | POA: Diagnosis not present

## 2013-05-02 DIAGNOSIS — I5043 Acute on chronic combined systolic (congestive) and diastolic (congestive) heart failure: Secondary | ICD-10-CM | POA: Diagnosis not present

## 2013-05-02 DIAGNOSIS — C679 Malignant neoplasm of bladder, unspecified: Secondary | ICD-10-CM | POA: Diagnosis not present

## 2013-05-02 DIAGNOSIS — N189 Chronic kidney disease, unspecified: Secondary | ICD-10-CM | POA: Diagnosis not present

## 2013-05-02 LAB — POCT INR: INR: 2.3

## 2013-05-06 DIAGNOSIS — N189 Chronic kidney disease, unspecified: Secondary | ICD-10-CM | POA: Diagnosis not present

## 2013-05-06 DIAGNOSIS — I4891 Unspecified atrial fibrillation: Secondary | ICD-10-CM | POA: Diagnosis not present

## 2013-05-06 DIAGNOSIS — I509 Heart failure, unspecified: Secondary | ICD-10-CM | POA: Diagnosis not present

## 2013-05-06 DIAGNOSIS — C679 Malignant neoplasm of bladder, unspecified: Secondary | ICD-10-CM | POA: Diagnosis not present

## 2013-05-06 DIAGNOSIS — D631 Anemia in chronic kidney disease: Secondary | ICD-10-CM | POA: Diagnosis not present

## 2013-05-06 DIAGNOSIS — I5043 Acute on chronic combined systolic (congestive) and diastolic (congestive) heart failure: Secondary | ICD-10-CM | POA: Diagnosis not present

## 2013-05-08 DIAGNOSIS — I5043 Acute on chronic combined systolic (congestive) and diastolic (congestive) heart failure: Secondary | ICD-10-CM | POA: Diagnosis not present

## 2013-05-08 DIAGNOSIS — D631 Anemia in chronic kidney disease: Secondary | ICD-10-CM | POA: Diagnosis not present

## 2013-05-08 DIAGNOSIS — I4891 Unspecified atrial fibrillation: Secondary | ICD-10-CM | POA: Diagnosis not present

## 2013-05-08 DIAGNOSIS — N189 Chronic kidney disease, unspecified: Secondary | ICD-10-CM | POA: Diagnosis not present

## 2013-05-08 DIAGNOSIS — C679 Malignant neoplasm of bladder, unspecified: Secondary | ICD-10-CM | POA: Diagnosis not present

## 2013-05-08 DIAGNOSIS — I509 Heart failure, unspecified: Secondary | ICD-10-CM | POA: Diagnosis not present

## 2013-05-12 ENCOUNTER — Other Ambulatory Visit: Payer: Self-pay | Admitting: Cardiology

## 2013-05-13 DIAGNOSIS — I509 Heart failure, unspecified: Secondary | ICD-10-CM | POA: Diagnosis not present

## 2013-05-13 DIAGNOSIS — I4891 Unspecified atrial fibrillation: Secondary | ICD-10-CM | POA: Diagnosis not present

## 2013-05-13 DIAGNOSIS — C679 Malignant neoplasm of bladder, unspecified: Secondary | ICD-10-CM | POA: Diagnosis not present

## 2013-05-13 DIAGNOSIS — D631 Anemia in chronic kidney disease: Secondary | ICD-10-CM | POA: Diagnosis not present

## 2013-05-13 DIAGNOSIS — I5043 Acute on chronic combined systolic (congestive) and diastolic (congestive) heart failure: Secondary | ICD-10-CM | POA: Diagnosis not present

## 2013-05-13 DIAGNOSIS — N189 Chronic kidney disease, unspecified: Secondary | ICD-10-CM | POA: Diagnosis not present

## 2013-05-14 ENCOUNTER — Encounter (HOSPITAL_COMMUNITY): Payer: Self-pay | Admitting: Emergency Medicine

## 2013-05-14 ENCOUNTER — Telehealth: Payer: Self-pay | Admitting: Nurse Practitioner

## 2013-05-14 ENCOUNTER — Emergency Department (HOSPITAL_COMMUNITY)
Admission: EM | Admit: 2013-05-14 | Discharge: 2013-05-14 | Disposition: A | Discharge status: Home / Self Care | Payer: Medicare Other | Attending: Emergency Medicine | Admitting: Emergency Medicine

## 2013-05-14 DIAGNOSIS — Z87442 Personal history of urinary calculi: Secondary | ICD-10-CM | POA: Insufficient documentation

## 2013-05-14 DIAGNOSIS — I129 Hypertensive chronic kidney disease with stage 1 through stage 4 chronic kidney disease, or unspecified chronic kidney disease: Secondary | ICD-10-CM | POA: Diagnosis not present

## 2013-05-14 DIAGNOSIS — T839XXA Unspecified complication of genitourinary prosthetic device, implant and graft, initial encounter: Secondary | ICD-10-CM

## 2013-05-14 DIAGNOSIS — Z87891 Personal history of nicotine dependence: Secondary | ICD-10-CM | POA: Insufficient documentation

## 2013-05-14 DIAGNOSIS — Z8669 Personal history of other diseases of the nervous system and sense organs: Secondary | ICD-10-CM | POA: Insufficient documentation

## 2013-05-14 DIAGNOSIS — T83091A Other mechanical complication of indwelling urethral catheter, initial encounter: Secondary | ICD-10-CM | POA: Diagnosis not present

## 2013-05-14 DIAGNOSIS — Z8619 Personal history of other infectious and parasitic diseases: Secondary | ICD-10-CM | POA: Diagnosis not present

## 2013-05-14 DIAGNOSIS — Z79899 Other long term (current) drug therapy: Secondary | ICD-10-CM | POA: Insufficient documentation

## 2013-05-14 DIAGNOSIS — Z85828 Personal history of other malignant neoplasm of skin: Secondary | ICD-10-CM | POA: Diagnosis not present

## 2013-05-14 DIAGNOSIS — I4891 Unspecified atrial fibrillation: Secondary | ICD-10-CM | POA: Insufficient documentation

## 2013-05-14 DIAGNOSIS — I5042 Chronic combined systolic (congestive) and diastolic (congestive) heart failure: Secondary | ICD-10-CM | POA: Diagnosis not present

## 2013-05-14 DIAGNOSIS — N189 Chronic kidney disease, unspecified: Secondary | ICD-10-CM | POA: Diagnosis not present

## 2013-05-14 DIAGNOSIS — Y846 Urinary catheterization as the cause of abnormal reaction of the patient, or of later complication, without mention of misadventure at the time of the procedure: Secondary | ICD-10-CM | POA: Insufficient documentation

## 2013-05-14 DIAGNOSIS — Z7901 Long term (current) use of anticoagulants: Secondary | ICD-10-CM | POA: Insufficient documentation

## 2013-05-14 DIAGNOSIS — I10 Essential (primary) hypertension: Secondary | ICD-10-CM | POA: Diagnosis not present

## 2013-05-14 NOTE — Telephone Encounter (Signed)
S/w Vinnie Level from [facility] and stated already had phone call in to Dr. Ralene Muskrat office. Stated we could not sign orders. Stated verbal understanding and she thanked me

## 2013-05-14 NOTE — Discharge Instructions (Signed)
Please follow up with your primary care physician in 1-2 days. If you do not have one please call the [place] number listed above. Please follow up with Dr. [name] at your scheduled follow up appointment or sooner if needed. Please read all discharge instructions and return precautions.   Foley Catheter Care, Adult A Foley catheter is a soft, flexible tube that is placed into the bladder to drain urine. A Foley catheter may be inserted if:  You leak urine or are not able to control when you urinate (urinary incontinence).  You are not able to urinate when you need to (urinary retention).  You had prostate surgery or surgery on the genitals.  You have certain medical conditions, such as multiple sclerosis, dementia, or a spinal cord injury. If you are going home with a Foley catheter in place, follow the instructions below. TAKING CARE OF THE CATHETER 1. Wash your hands with soap and water. 2. Using mild soap and warm water on a clean washcloth:  Clean the area on your body closest to the catheter insertion site using a circular motion, moving away from the catheter. Never wipe toward the catheter because this could sweep bacteria up into the urethra and cause infection.  Remove all traces of soap. Pat the area dry with a clean towel. For males, reposition the foreskin. 3. Attach the catheter to your leg so there is no tension on the catheter. Use adhesive tape or a leg strap. If you are using adhesive tape, remove any sticky residue left behind by the previous tape you used. 4. Keep the drainage bag below the level of the bladder, but keep it off the floor. 5. Check throughout the day to be sure the catheter is working and urine is draining freely. Make sure the tubing does not become kinked. 6. Do not pull on the catheter or try to remove it. Pulling could damage internal tissues. TAKING CARE OF THE DRAINAGE BAGS You will be given two drainage bags to take home. One  is a large overnight drainage bag, and the other is a smaller leg bag that fits underneath clothing. You may wear the overnight bag at any time, but you should never wear the smaller leg bag at night. Follow the instructions below for how to empty, change, and clean your drainage bags. Emptying the Drainage Bag You must empty your drainage bag when it is    full or at least 2 3 times a day. 1. Wash your hands with soap and water. 2. Keep the drainage bag below your hips, below the level of your bladder. This stops urine from going back into the tubing and into your bladder. 3. Hold the dirty bag over the toilet or a clean container. 4. Open the pour spout at the bottom of the bag and empty the urine into the toilet or container. Do not let the pour spout touch the toilet, container, or any other surface. Doing so can place bacteria on the bag, which can cause an infection. 5. Clean the pour spout with a gauze pad or cotton ball that has rubbing alcohol on it. 6. Close the pour spout. 7. Attach the bag to your leg with adhesive tape or a leg strap. 8. Wash your hands well. Changing the Drainage Bag Change your drainage bag once a month or sooner if it starts to smell bad or look dirty. Below are steps to follow when changing the drainage bag. 1. Wash your hands with  soap and water. 2. Pinch off the rubber catheter so that urine does not spill out. 3. Disconnect the catheter tube from the drainage tube at the connection valve. Do not let the tubes touch any surface. 4. Clean the end of the catheter tube with an alcohol wipe. Use a different alcohol wipe to clean the end of the drainage tube. 5. Connect the catheter tube to the drainage tube of the clean drainage bag. 6. Attach the new bag to the leg with adhesive tape or a leg strap. Avoid attaching the new bag too tightly. 7. Wash your hands well. Cleaning the Drainage Bag 1. Wash your hands with soap and water. 2. Wash the bag in warm, soapy  water. 3. Rinse the bag thoroughly with warm water. 4. Fill the bag with a solution of white vinegar and water (1 cup vinegar to 1 qt warm water [.2 L vinegar to 1 L warm water]). Close the bag and soak it for 30 minutes in the solution. 5. Rinse the bag with warm water. 6. Hang the bag to dry with the pour spout open and hanging downward. 7. Store the clean bag (once it is dry) in a clean plastic bag. 8. Wash your hands well. PREVENTING INFECTION  Wash your hands before and after handling your catheter.  Take showers daily and wash the area where the catheter enters your body. Do not take baths. Replace wet leg straps with dry ones, if this applies.  Do not use powders, sprays, or lotions on the genital area. Only use creams, lotions, or ointments as directed by your caregiver.  For females, wipe from front to back after each bowel movement.  Drink enough fluids to keep your urine clear or pale yellow unless you have a fluid restriction.  Do not let the drainage bag or tubing touch or lie on the floor.  Wear cotton underwear to absorb moisture and to keep your skin drier. SEEK MEDICAL CARE IF:   Your urine is cloudy or smells unusually bad.  Your catheter becomes clogged.  You are not draining urine into the bag or your bladder feels full.  Your catheter starts to leak. SEEK IMMEDIATE MEDICAL CARE IF:   You have pain, swelling, redness, or pus where the catheter enters the body.  You have pain in the abdomen, legs, lower back, or bladder.  You have a fever.  You see blood fill the catheter, or your urine is pink or red.  You have nausea, vomiting, or chills.  Your catheter gets pulled out. MAKE SURE YOU:   Understand these instructions.  Will watch your condition.  Will get help right away if you are not doing well or get worse. Document Released: 12/26/2004 Document Revised: 04/22/2012 Document Reviewed: 12/18/2011 [facility] Patient Information 2014 [place].

## 2013-05-14 NOTE — ED Provider Notes (Signed)
CSN: [id number]     Arrival date & time 05/14/13  1715 History   First MD Initiated Contact with Patient 05/14/13 1717     Chief Complaint  Patient presents with  . Male GU Problem     (Consider location/radiation/quality/duration/timing/severity/associated sxs/prior Treatment) HPI Comments: Patient is a 80 yo M PMHx significant for prostate cancer with chronic Foley placement s/p transurethral resection of prostate, atrial fibrillation BPH, hypertension, gout, seizures, CAD, CHF presented to the emergency department for a Foley catheter complication. Patient states he awoke last night and noticed he was urinating on himself rather into the Foley bag. He noticed that the connection between the catheter and up on her back was loose. He and his wife believe to be connected, patient was awoken later in the morning with the same issue. He, his wife, his son have been unable to correct the issue. He was unable to see Dr. Roni Bread for correction so he came here. He denies any issue passing urine through the Foley. No clots in the foley. He denies any fevers, chills, nausea, vomiting, diarrhea, abdominal pain, testicular or penile pain or swelling. He has not had any hematuria. Patient had the Foley catheter changed 2 weeks ago and is due to see Dr. Roni Bread in 2 more weeks. He is otherwise been well.  Patient is a 80 y.o. male presenting with male genitourinary complaint.  Male GU Problem Presenting symptoms: no dysuria and no penile pain   Associated symptoms: no abdominal pain, no diarrhea, no fever, no hematuria, no nausea, no penile swelling, no scrotal swelling and no vomiting     Past Medical History  Diagnosis Date  . Chronic atrial fibrillation   . Chronic anticoagulation     a. afib/st. jude avr - goal 2.5-3.5   . BPH (benign prostatic hypertrophy)   . Hypertension   . History of kidney stones   . Gout   . Syncope   . History of skin cancer   . Bladder tumor     a. s/p resection 02/19/2013.   [name] Hx of scarlet fever   . Seizures 2014    evaluated by Dr. Jannifer Franklin at [facility] neurology -  no cause determined - refered back to cardiologist - recommended loop recorder to monitor rythm but pt refused - continues to have seizures per family - notes in EPIC  . Chronic combined systolic and diastolic CHF (congestive heart failure)     a. EF 45 to 50% per echo in 2011 following exacerbation  . Chronic kidney disease    Past Surgical History  Procedure Laterality Date  . Aortic valve replacement  1987    #21 MM ST JUDE PLACED BY DR.CHARLES WILSON  . Cataract extraction    . Transurethral resection of prostate N/A 02/19/2013    Procedure: TRANSURETHRAL RESECTION OF THE PROSTATE /bladder WITH GYRUS INSTRUMENTS;  Surgeon: Irine Seal, MD;  Location: [facility];  Service: Urology;  Laterality: N/A;  . Cystoscopy N/A 02/19/2013    Procedure: CYSTOSCOPY;  Surgeon: Irine Seal, MD;  Location: [facility];  Service: Urology;  Laterality: N/A;   Family History  Problem Relation Age of Onset  . Cancer Mother   . Heart disease Father   . Asthma Father   . Stroke Father   . Dementia Sister   . Heart disease Brother    History  Substance Use Topics  . Smoking status: Former Smoker    Quit date: 01/10/1971  . Smokeless tobacco: Never Used  . Alcohol Use:  No    Review of Systems  Constitutional: Negative for fever and chills.  Respiratory: Negative for shortness of breath.   Cardiovascular: Negative for chest pain.  Gastrointestinal: Negative for nausea, vomiting, abdominal pain, diarrhea and constipation.  Genitourinary: Negative for dysuria, urgency, hematuria, decreased urine volume, penile swelling, scrotal swelling, penile pain and testicular pain.  All other systems reviewed and are negative.     Allergies  Ace inhibitors; Codeine; and Sulfa drugs cross reactors  Home Medications   Prior to Admission medications   Medication Sig Start Date End Date Taking? Authorizing Provider   carvedilol (COREG) 6.25 MG tablet TAKE ONE TABLET BY MOUTH TWICE DAILY    Minus Breeding, MD  diltiazem (CARDIZEM CD) 240 MG 24 hr capsule Take 1 capsule (240 mg total) by mouth daily. 04/23/13   Theodis Blaze, MD  feeding supplement, ENSURE COMPLETE, (ENSURE COMPLETE) LIQD Take 237 mLs by mouth 2 (two) times daily between meals. 03/15/13   Robbie Lis, MD  finasteride (PROSCAR) 5 MG tablet Take 5 mg by mouth every morning.     Historical Provider, MD  furosemide (LASIX) 40 MG tablet Take 1 tablet (40 mg total) by mouth daily. Take only as needed - for increased swelling or weight gain of 3 pounds overnight 04/23/13   Theodis Blaze, MD  potassium chloride (KLOR-CON) 20 MEQ packet Take 10 mEq by mouth daily. 03/15/13   Robbie Lis, MD  simvastatin (ZOCOR) 10 MG tablet Take 10 mg by mouth at bedtime.    Historical Provider, MD  terazosin (HYTRIN) 5 MG capsule Take 5 mg by mouth at bedtime.      Historical Provider, MD  warfarin (COUMADIN) 5 MG tablet 2.5-5 mg daily at 6 PM. Beginning Tues, 03/11/13, take 1/2 tab (2.5mg ) once daily except every 4th day take 1 tab (5mg ) once daily.    Historical Provider, MD   BP 140/64  Pulse 79  Temp(Src) 97.9 F (36.6 C) (Oral)  Resp 18  SpO2 100% Physical Exam  Nursing note and vitals reviewed. Constitutional: He is oriented to person, place, and time. He appears well-developed and well-nourished. No distress.  No gross blood in urine.   HENT:  Head: Normocephalic and atraumatic.  Right Ear: External ear normal.  Left Ear: External ear normal.  Nose: Nose normal.  Eyes: Conjunctivae are normal.  Neck: Neck supple.  Cardiovascular: Normal rate, regular rhythm, normal heart sounds and intact distal pulses.   Mechanical click appreciated.   Pulmonary/Chest: Effort normal and breath sounds normal.  Abdominal: Soft. Bowel sounds are normal. He exhibits no distension. There is no tenderness. There is no rebound.  Genitourinary: Testes normal and penis normal.  Right testis shows no swelling and no tenderness. Right testis is descended. Left testis shows no swelling and no tenderness. Left testis is descended. No penile tenderness.  Foley catheter in place.   Musculoskeletal: Normal range of motion.  Lymphadenopathy:       Right: No inguinal adenopathy present.       Left: No inguinal adenopathy present.  Neurological: He is alert and oriented to person, place, and time.  Skin: Skin is warm and dry. He is not diaphoretic.    ED Course  Procedures (including critical care time) Medications - No data to display  Labs Review Labs Reviewed - No data to display  Imaging Review No results found.   EKG Interpretation None      MDM   Final diagnoses:  Foley catheter problem  Filed Vitals:   05/14/13 1742  BP: 140/64  Pulse: 79  Temp: 97.9 F (36.6 C)  Resp: 18   Afebrile, NAD, non-toxic appearing, AAOx4.   Patient presenting with Foley catheter issue. The connection became less at some point last evening and has been leaking urine with every episode of urination. Patient has no other complaints at this time. No recent illnesses. Abdomen is soft, nontender, nondistended. GU exam is unremarkable. Patient is able to pass urine in emergency department without any evidence of gross hematuria or difficulty. Given history, benign physical exam do not feel patient requires any further testing at today's visit. Advised patient to follow up with urology. Return precautions discussed. Patient is agreeable to plan. Patient is stable at discharge.  Patient d/w with Dr. Venora Maples, agrees with plan.      [place], PA-C 05/15/13 0109

## 2013-05-14 NOTE — Telephone Encounter (Signed)
New message    [facility] calling need to speak with Tera Helper today   Need re-certification order for  general teaching, foley care,    Re-certification orders  ends this week.

## 2013-05-14 NOTE — ED Notes (Signed)
Pt A+Ox4, presents with family with c/o foley catheter problem.  Pt reports urine leaking from attachment between foley cath and drainage bag.  Wife reports "it came apart last night".  Connection was taped by wife, tape removed at this time and few drops urine noted.  Drainage device appeared slightly unconnected and reconnected at this time.  Pt denies other complaints.  Skin PWD.  MAEI.  Speaking full/clear sentences.  A+Ox4.  Changed to hospital gown.  NAD.  Awaiting EDP eval.

## 2013-05-14 NOTE — Telephone Encounter (Signed)
I would think this would go to GU - I never ordered this and do not think I am able to sign.  Please call [facility] and see if this can go to Dr. [name].

## 2013-05-16 DIAGNOSIS — I509 Heart failure, unspecified: Secondary | ICD-10-CM | POA: Diagnosis not present

## 2013-05-16 DIAGNOSIS — Z466 Encounter for fitting and adjustment of urinary device: Secondary | ICD-10-CM | POA: Diagnosis not present

## 2013-05-16 DIAGNOSIS — I4891 Unspecified atrial fibrillation: Secondary | ICD-10-CM | POA: Diagnosis not present

## 2013-05-16 DIAGNOSIS — N189 Chronic kidney disease, unspecified: Secondary | ICD-10-CM | POA: Diagnosis not present

## 2013-05-16 DIAGNOSIS — C679 Malignant neoplasm of bladder, unspecified: Secondary | ICD-10-CM | POA: Diagnosis not present

## 2013-05-16 DIAGNOSIS — R627 Adult failure to thrive: Secondary | ICD-10-CM | POA: Diagnosis not present

## 2013-05-16 NOTE — ED Provider Notes (Signed)
Medical screening examination/treatment/procedure(s) were conducted as a shared visit with non-physician practitioner(s) and myself.  I personally evaluated the patient during the encounter.   EKG Interpretation None      Disconnection. No other issues.  Hoy Morn, MD 05/16/13 [phone]

## 2013-05-19 ENCOUNTER — Ambulatory Visit (INDEPENDENT_AMBULATORY_CARE_PROVIDER_SITE_OTHER): Payer: Medicare Other | Admitting: *Deleted

## 2013-05-19 ENCOUNTER — Ambulatory Visit (INDEPENDENT_AMBULATORY_CARE_PROVIDER_SITE_OTHER): Payer: Medicare Other | Admitting: Nurse Practitioner

## 2013-05-19 ENCOUNTER — Encounter: Payer: Self-pay | Admitting: Nurse Practitioner

## 2013-05-19 VITALS — BP 110/52 | HR 88 | Ht 69.0 in | Wt 167.8 lb

## 2013-05-19 DIAGNOSIS — I482 Chronic atrial fibrillation, unspecified: Secondary | ICD-10-CM

## 2013-05-19 DIAGNOSIS — Z7901 Long term (current) use of anticoagulants: Secondary | ICD-10-CM | POA: Diagnosis not present

## 2013-05-19 DIAGNOSIS — Z952 Presence of prosthetic heart valve: Secondary | ICD-10-CM

## 2013-05-19 DIAGNOSIS — Z954 Presence of other heart-valve replacement: Secondary | ICD-10-CM

## 2013-05-19 DIAGNOSIS — C679 Malignant neoplasm of bladder, unspecified: Secondary | ICD-10-CM

## 2013-05-19 DIAGNOSIS — I4891 Unspecified atrial fibrillation: Secondary | ICD-10-CM

## 2013-05-19 DIAGNOSIS — I5022 Chronic systolic (congestive) heart failure: Secondary | ICD-10-CM | POA: Diagnosis not present

## 2013-05-19 LAB — POCT INR: INR: 2.1

## 2013-05-19 MED ORDER — POTASSIUM CHLORIDE ER 10 MEQ PO TBCR: 10.0000 meq | EXTENDED_RELEASE_TABLET | Freq: Every day | ORAL | Status: DC | PRN

## 2013-05-19 MED ORDER — FUROSEMIDE 40 MG PO TABS: 40.0000 mg | ORAL_TABLET | Freq: Every day | ORAL | Status: DC | PRN

## 2013-05-19 NOTE — Progress Notes (Signed)
Eric Wheeler Date of Birth: 1919-05-16 Medical Record #[id number]  History of Present Illness: Eric Wheeler is seen back today for a one month follow up/post hospital visit. Seen for Dr. Percival Spanish - who has not yet met - former patient of Dr. Susa Simmonds. He is 80 years of age. He has chronic systolic HF with EF of 45 to 50% (per echo 02/2013) with prior AVR in 1987, HTN and in chronic atrial fib - managed with rate control and coumadin. He is intolerant to ACE due to dizziness.   Most recently has had surgery for a large bladder tumor - complicated by hematuria, obstruction, renal failure, AF with RVR - he has been back to the hospital/ER several times. He was contemplating no further treatment for his bladder cancer when I saw him last month.   Comes back today. Here with wife and son-in-law today. Back in the ER last week for foley problems. He actually had mowed the grass - got the catheter caught up in the mower somehow. Wife has trouble hooking up the leg bag. No chest pain. Breathing is stable for him. Weight is slowly going up. No swelling. No cough. Family notes he is moving around more. Not dizzy. Appetite picking up. Has decided to not have any more treatment for his bladder cancer.   Current Outpatient Prescriptions  Medication Sig Dispense Refill  . carvedilol (COREG) 6.25 MG tablet TAKE ONE TABLET BY MOUTH TWICE DAILY  60 tablet  0  . diltiazem (CARDIZEM CD) 240 MG 24 hr capsule Take 1 capsule (240 mg total) by mouth daily.  30 capsule  3  . feeding supplement, ENSURE COMPLETE, (ENSURE COMPLETE) LIQD Take 237 mLs by mouth 2 (two) times daily between meals.  10 Bottle  0  . finasteride (PROSCAR) 5 MG tablet Take 5 mg by mouth every morning.       . simvastatin (ZOCOR) 10 MG tablet Take 10 mg by mouth at bedtime.      [name] terazosin (HYTRIN) 5 MG capsule Take 5 mg by mouth at bedtime.        [name] warfarin (COUMADIN) 5 MG tablet 2.5-5 mg daily at 6 PM. Beginning Tues, 03/11/13, take 1/2 tab  (2.$Remov'5mg'tpskJx$ ) once daily except every 4th day take 1 tab ($Remo'5mg'ozthI$ ) once daily.       No current facility-administered medications for this visit.    Allergies  Allergen Reactions  . Ace Inhibitors Other (See Comments)    Dizziness   . Codeine Nausea And Vomiting  . Sulfa Drugs Cross Reactors Nausea And Vomiting    Past Medical History  Diagnosis Date  . Chronic atrial fibrillation   . Chronic anticoagulation     a. afib/st. jude avr - goal 2.5-3.5   . BPH (benign prostatic hypertrophy)   . Hypertension   . History of kidney stones   . Gout   . Syncope   . History of skin cancer   . Bladder tumor     a. s/p resection 02/19/2013.  [name] Hx of scarlet fever   . Seizures 2014    evaluated by Dr. Jannifer Franklin at [facility] neurology -  no cause determined - refered back to cardiologist - recommended loop recorder to monitor rythm but pt refused - continues to have seizures per family - notes in EPIC  . Chronic combined systolic and diastolic CHF (congestive heart failure)     a. EF 45 to 50% per echo in 2011 following exacerbation  . Chronic kidney disease  Past Surgical History  Procedure Laterality Date  . Aortic valve replacement  1987    #21 MM ST JUDE PLACED BY DR.CHARLES WILSON  . Cataract extraction    . Transurethral resection of prostate N/A 02/19/2013    Procedure: TRANSURETHRAL RESECTION OF THE PROSTATE /bladder WITH GYRUS INSTRUMENTS;  Surgeon: Irine Seal, MD;  Location: [facility];  Service: Urology;  Laterality: N/A;  . Cystoscopy N/A 02/19/2013    Procedure: CYSTOSCOPY;  Surgeon: Irine Seal, MD;  Location: [facility];  Service: Urology;  Laterality: N/A;    History  Smoking status  . Former Smoker  . Quit date: 01/10/1971  Smokeless tobacco  . Never Used    History  Alcohol Use No    Family History  Problem Relation Age of Onset  . Cancer Mother   . Heart disease Father   . Asthma Father   . Stroke Father   . Dementia Sister   . Heart disease Brother     Review of  Systems: The review of systems is per the HPI.  All other systems were reviewed and are negative.  Physical Exam: BP 110/52  Pulse 88  Ht $R'5\' 9"'vd$  (1.753 m)  Wt 167 lb 12.8 oz (76.114 kg)  BMI 24.77 kg/m2  SpO2 97% Patient is very pleasant and in no acute distress. He does look stronger today. Skin is warm and dry. Color is normal.  HEENT is unremarkable. Normocephalic/atraumatic. PERRL. Sclera are nonicteric. Neck is supple. No masses. No JVD. Lungs are clear. Cardiac exam shows an irregular rhythm. Rate ok. Abdomen is soft. Extremities are without edema. Gait and ROM are intact. Using a cane. Has his foley bag hooked to his belt. No gross neurologic deficits noted.  Wt Readings from Last 3 Encounters:  05/19/13 167 lb 12.8 oz (76.114 kg)  04/25/13 162 lb 12.8 oz (73.846 kg)  04/23/13 156 lb 12 oz (71.1 kg)     LABORATORY DATA: INR pending  Lab Results  Component Value Date   WBC 9.2 04/25/2013   HGB 10.9* 04/25/2013   HCT 33.0* 04/25/2013   PLT 210.0 04/25/2013   GLUCOSE 155* 04/25/2013   CHOL 148 09/20/2012   TRIG 97.0 09/20/2012   HDL 33.50* 09/20/2012   LDLCALC 95 09/20/2012   ALT 11 02/28/2013   AST 9 02/28/2013   NA 141 04/25/2013   K 4.0 04/25/2013   CL 106 04/25/2013   CREATININE 1.4 04/25/2013   BUN 39* 04/25/2013   CO2 27 04/25/2013   TSH 2.843 02/27/2013   INR 2.3 05/02/2013   Echo Study Conclusions from February 2015  - Left ventricle: The cavity size was normal. Wall thickness was normal. Systolic function was mildly reduced. The estimated ejection fraction was in the range of 45% to 50%. Diffuse hypokinesis. Indeterminant diastolic function (atrial fibrillation). - Aortic valve: St Jude mechanical aortic valve. No significant prosthetic valve regurgitation or stenosis. Mean gradient: 72mm Hg (S). - Mitral valve: Moderately calcified annulus. Moderately calcified leaflets . Mild to moderate regurgitation. - Left atrium: The atrium was mildly to moderately dilated. -  Right ventricle: The cavity size was mildly dilated. Systolic function was normal. - Right atrium: The atrium was moderately dilated. - Tricuspid valve: Peak RV-RA gradient: 60mm Hg (S). Maximal regurgitant velocity: 292cm/s. - Pulmonary arteries: PA peak pressure: 82mm Hg (S). - Systemic veins: IVC measured 2.3 cm with < 50% respirophasic variation, suggesting RA pressure 15 mmHg. Impressions:  - The patient was in atrial fibrillation. Normal LV size with  EF 45-50%, mild diffuse hypokinesis. Mild to moderate MR. St Jude mechanical aortic valve appeared to function normally. Mildly dilated RV with normal systolic function. Moderate pulmonary hypertension.  Assessment / Plan:  1. Chronic atrial fib - rate is ok. I have left him on his current regimen.   2. Recent resection of large bladder tumor - complicated by renal failure, UTI, urinary retention - sees Dr. [name] - he has decided to not have any further therapy for his bladder cancer. Will focus on quality/comfort.   3. Prior AVR   4. Systolic HF - using lasix/potassium prn.   5. Advanced age   He seems to be holding his own. Check INR today. See back in 2 months.   Patient is agreeable to this plan and will call if any problems develop in the interim.   Burtis Junes, RN, [place]  [address] [place]  [place], [place] [address]  [phone]

## 2013-05-19 NOTE — Patient Instructions (Signed)
I will see you in 2 months  Stay on your current medicines for now  Continue to weigh each day - use the Lasix if needed.   Call the [place] office at [phone] if you have any questions, problems or concerns.

## 2013-05-21 DIAGNOSIS — R627 Adult failure to thrive: Secondary | ICD-10-CM | POA: Diagnosis not present

## 2013-05-21 DIAGNOSIS — I509 Heart failure, unspecified: Secondary | ICD-10-CM | POA: Diagnosis not present

## 2013-05-21 DIAGNOSIS — N189 Chronic kidney disease, unspecified: Secondary | ICD-10-CM | POA: Diagnosis not present

## 2013-05-21 DIAGNOSIS — I4891 Unspecified atrial fibrillation: Secondary | ICD-10-CM | POA: Diagnosis not present

## 2013-05-21 DIAGNOSIS — Z466 Encounter for fitting and adjustment of urinary device: Secondary | ICD-10-CM | POA: Diagnosis not present

## 2013-05-21 DIAGNOSIS — C679 Malignant neoplasm of bladder, unspecified: Secondary | ICD-10-CM | POA: Diagnosis not present

## 2013-05-28 DIAGNOSIS — I509 Heart failure, unspecified: Secondary | ICD-10-CM | POA: Diagnosis not present

## 2013-05-28 DIAGNOSIS — R627 Adult failure to thrive: Secondary | ICD-10-CM | POA: Diagnosis not present

## 2013-05-28 DIAGNOSIS — I4891 Unspecified atrial fibrillation: Secondary | ICD-10-CM | POA: Diagnosis not present

## 2013-05-28 DIAGNOSIS — N189 Chronic kidney disease, unspecified: Secondary | ICD-10-CM | POA: Diagnosis not present

## 2013-05-28 DIAGNOSIS — Z466 Encounter for fitting and adjustment of urinary device: Secondary | ICD-10-CM | POA: Diagnosis not present

## 2013-05-28 DIAGNOSIS — C679 Malignant neoplasm of bladder, unspecified: Secondary | ICD-10-CM | POA: Diagnosis not present

## 2013-06-03 DIAGNOSIS — R627 Adult failure to thrive: Secondary | ICD-10-CM | POA: Diagnosis not present

## 2013-06-03 DIAGNOSIS — Z466 Encounter for fitting and adjustment of urinary device: Secondary | ICD-10-CM | POA: Diagnosis not present

## 2013-06-03 DIAGNOSIS — I509 Heart failure, unspecified: Secondary | ICD-10-CM | POA: Diagnosis not present

## 2013-06-03 DIAGNOSIS — N189 Chronic kidney disease, unspecified: Secondary | ICD-10-CM | POA: Diagnosis not present

## 2013-06-03 DIAGNOSIS — I4891 Unspecified atrial fibrillation: Secondary | ICD-10-CM | POA: Diagnosis not present

## 2013-06-03 DIAGNOSIS — C679 Malignant neoplasm of bladder, unspecified: Secondary | ICD-10-CM | POA: Diagnosis not present

## 2013-06-04 ENCOUNTER — Ambulatory Visit (INDEPENDENT_AMBULATORY_CARE_PROVIDER_SITE_OTHER): Payer: Medicare Other | Admitting: Pharmacist

## 2013-06-04 DIAGNOSIS — Z7901 Long term (current) use of anticoagulants: Secondary | ICD-10-CM | POA: Diagnosis not present

## 2013-06-04 DIAGNOSIS — I4891 Unspecified atrial fibrillation: Secondary | ICD-10-CM

## 2013-06-04 DIAGNOSIS — Z954 Presence of other heart-valve replacement: Secondary | ICD-10-CM

## 2013-06-04 DIAGNOSIS — I482 Chronic atrial fibrillation, unspecified: Secondary | ICD-10-CM

## 2013-06-04 DIAGNOSIS — Z952 Presence of prosthetic heart valve: Secondary | ICD-10-CM

## 2013-06-04 LAB — POCT INR: INR: 1.6

## 2013-06-12 ENCOUNTER — Other Ambulatory Visit: Payer: Self-pay | Admitting: Nurse Practitioner

## 2013-06-12 DIAGNOSIS — Z466 Encounter for fitting and adjustment of urinary device: Secondary | ICD-10-CM | POA: Diagnosis not present

## 2013-06-12 DIAGNOSIS — I509 Heart failure, unspecified: Secondary | ICD-10-CM | POA: Diagnosis not present

## 2013-06-12 DIAGNOSIS — I4891 Unspecified atrial fibrillation: Secondary | ICD-10-CM | POA: Diagnosis not present

## 2013-06-12 DIAGNOSIS — N189 Chronic kidney disease, unspecified: Secondary | ICD-10-CM | POA: Diagnosis not present

## 2013-06-12 DIAGNOSIS — C679 Malignant neoplasm of bladder, unspecified: Secondary | ICD-10-CM | POA: Diagnosis not present

## 2013-06-12 DIAGNOSIS — R627 Adult failure to thrive: Secondary | ICD-10-CM | POA: Diagnosis not present

## 2013-06-13 ENCOUNTER — Other Ambulatory Visit: Payer: Self-pay | Admitting: *Deleted

## 2013-06-13 MED ORDER — SIMVASTATIN 10 MG PO TABS: 10.0000 mg | ORAL_TABLET | Freq: Every day | ORAL | Status: DC

## 2013-06-17 DIAGNOSIS — I4891 Unspecified atrial fibrillation: Secondary | ICD-10-CM | POA: Diagnosis not present

## 2013-06-17 DIAGNOSIS — R627 Adult failure to thrive: Secondary | ICD-10-CM | POA: Diagnosis not present

## 2013-06-17 DIAGNOSIS — Z466 Encounter for fitting and adjustment of urinary device: Secondary | ICD-10-CM | POA: Diagnosis not present

## 2013-06-17 DIAGNOSIS — C679 Malignant neoplasm of bladder, unspecified: Secondary | ICD-10-CM | POA: Diagnosis not present

## 2013-06-17 DIAGNOSIS — N189 Chronic kidney disease, unspecified: Secondary | ICD-10-CM | POA: Diagnosis not present

## 2013-06-17 DIAGNOSIS — I509 Heart failure, unspecified: Secondary | ICD-10-CM | POA: Diagnosis not present

## 2013-06-18 ENCOUNTER — Ambulatory Visit (INDEPENDENT_AMBULATORY_CARE_PROVIDER_SITE_OTHER): Payer: Medicare Other | Admitting: *Deleted

## 2013-06-18 DIAGNOSIS — Z954 Presence of other heart-valve replacement: Secondary | ICD-10-CM | POA: Diagnosis not present

## 2013-06-18 DIAGNOSIS — Z7901 Long term (current) use of anticoagulants: Secondary | ICD-10-CM

## 2013-06-18 DIAGNOSIS — I4891 Unspecified atrial fibrillation: Secondary | ICD-10-CM | POA: Diagnosis not present

## 2013-06-18 DIAGNOSIS — I482 Chronic atrial fibrillation, unspecified: Secondary | ICD-10-CM

## 2013-06-18 DIAGNOSIS — Z952 Presence of prosthetic heart valve: Secondary | ICD-10-CM

## 2013-06-18 LAB — POCT INR: INR: 2.3

## 2013-06-24 DIAGNOSIS — C679 Malignant neoplasm of bladder, unspecified: Secondary | ICD-10-CM | POA: Diagnosis not present

## 2013-06-24 DIAGNOSIS — Z466 Encounter for fitting and adjustment of urinary device: Secondary | ICD-10-CM | POA: Diagnosis not present

## 2013-06-24 DIAGNOSIS — I509 Heart failure, unspecified: Secondary | ICD-10-CM | POA: Diagnosis not present

## 2013-06-24 DIAGNOSIS — R627 Adult failure to thrive: Secondary | ICD-10-CM | POA: Diagnosis not present

## 2013-06-24 DIAGNOSIS — I4891 Unspecified atrial fibrillation: Secondary | ICD-10-CM | POA: Diagnosis not present

## 2013-06-24 DIAGNOSIS — N189 Chronic kidney disease, unspecified: Secondary | ICD-10-CM | POA: Diagnosis not present

## 2013-06-27 ENCOUNTER — Other Ambulatory Visit: Payer: Self-pay

## 2013-06-27 DIAGNOSIS — R339 Retention of urine, unspecified: Secondary | ICD-10-CM | POA: Diagnosis not present

## 2013-06-27 MED ORDER — CARVEDILOL 6.25 MG PO TABS: ORAL_TABLET | ORAL | Status: DC

## 2013-07-01 DIAGNOSIS — R627 Adult failure to thrive: Secondary | ICD-10-CM | POA: Diagnosis not present

## 2013-07-01 DIAGNOSIS — N189 Chronic kidney disease, unspecified: Secondary | ICD-10-CM | POA: Diagnosis not present

## 2013-07-01 DIAGNOSIS — Z466 Encounter for fitting and adjustment of urinary device: Secondary | ICD-10-CM | POA: Diagnosis not present

## 2013-07-01 DIAGNOSIS — I4891 Unspecified atrial fibrillation: Secondary | ICD-10-CM | POA: Diagnosis not present

## 2013-07-01 DIAGNOSIS — C679 Malignant neoplasm of bladder, unspecified: Secondary | ICD-10-CM | POA: Diagnosis not present

## 2013-07-01 DIAGNOSIS — I509 Heart failure, unspecified: Secondary | ICD-10-CM | POA: Diagnosis not present

## 2013-07-02 ENCOUNTER — Ambulatory Visit (INDEPENDENT_AMBULATORY_CARE_PROVIDER_SITE_OTHER): Payer: Medicare Other | Admitting: *Deleted

## 2013-07-02 DIAGNOSIS — R627 Adult failure to thrive: Secondary | ICD-10-CM | POA: Diagnosis not present

## 2013-07-02 DIAGNOSIS — Z466 Encounter for fitting and adjustment of urinary device: Secondary | ICD-10-CM | POA: Diagnosis not present

## 2013-07-02 DIAGNOSIS — N189 Chronic kidney disease, unspecified: Secondary | ICD-10-CM | POA: Diagnosis not present

## 2013-07-02 DIAGNOSIS — Z954 Presence of other heart-valve replacement: Secondary | ICD-10-CM

## 2013-07-02 DIAGNOSIS — C679 Malignant neoplasm of bladder, unspecified: Secondary | ICD-10-CM | POA: Diagnosis not present

## 2013-07-02 DIAGNOSIS — Z7901 Long term (current) use of anticoagulants: Secondary | ICD-10-CM

## 2013-07-02 DIAGNOSIS — I4891 Unspecified atrial fibrillation: Secondary | ICD-10-CM

## 2013-07-02 DIAGNOSIS — Z952 Presence of prosthetic heart valve: Secondary | ICD-10-CM

## 2013-07-02 DIAGNOSIS — I509 Heart failure, unspecified: Secondary | ICD-10-CM | POA: Diagnosis not present

## 2013-07-02 DIAGNOSIS — I482 Chronic atrial fibrillation, unspecified: Secondary | ICD-10-CM

## 2013-07-02 LAB — POCT INR: INR: 3.7

## 2013-07-10 DIAGNOSIS — Z466 Encounter for fitting and adjustment of urinary device: Secondary | ICD-10-CM | POA: Diagnosis not present

## 2013-07-10 DIAGNOSIS — I4891 Unspecified atrial fibrillation: Secondary | ICD-10-CM | POA: Diagnosis not present

## 2013-07-10 DIAGNOSIS — R627 Adult failure to thrive: Secondary | ICD-10-CM | POA: Diagnosis not present

## 2013-07-10 DIAGNOSIS — I509 Heart failure, unspecified: Secondary | ICD-10-CM | POA: Diagnosis not present

## 2013-07-10 DIAGNOSIS — C679 Malignant neoplasm of bladder, unspecified: Secondary | ICD-10-CM | POA: Diagnosis not present

## 2013-07-10 DIAGNOSIS — N189 Chronic kidney disease, unspecified: Secondary | ICD-10-CM | POA: Diagnosis not present

## 2013-07-20 ENCOUNTER — Encounter (HOSPITAL_COMMUNITY): Payer: Self-pay | Admitting: Emergency Medicine

## 2013-07-20 ENCOUNTER — Emergency Department (HOSPITAL_COMMUNITY)
Admission: EM | Admit: 2013-07-20 | Discharge: 2013-07-20 | Disposition: A | Discharge status: Home / Self Care | Payer: Medicare Other | Attending: Emergency Medicine | Admitting: Emergency Medicine

## 2013-07-20 DIAGNOSIS — N189 Chronic kidney disease, unspecified: Secondary | ICD-10-CM | POA: Diagnosis not present

## 2013-07-20 DIAGNOSIS — C679 Malignant neoplasm of bladder, unspecified: Secondary | ICD-10-CM | POA: Diagnosis not present

## 2013-07-20 DIAGNOSIS — Z87442 Personal history of urinary calculi: Secondary | ICD-10-CM | POA: Diagnosis not present

## 2013-07-20 DIAGNOSIS — Z87891 Personal history of nicotine dependence: Secondary | ICD-10-CM | POA: Insufficient documentation

## 2013-07-20 DIAGNOSIS — Z85828 Personal history of other malignant neoplasm of skin: Secondary | ICD-10-CM | POA: Insufficient documentation

## 2013-07-20 DIAGNOSIS — Z79899 Other long term (current) drug therapy: Secondary | ICD-10-CM | POA: Diagnosis not present

## 2013-07-20 DIAGNOSIS — Y846 Urinary catheterization as the cause of abnormal reaction of the patient, or of later complication, without mention of misadventure at the time of the procedure: Secondary | ICD-10-CM | POA: Insufficient documentation

## 2013-07-20 DIAGNOSIS — T83091A Other mechanical complication of indwelling urethral catheter, initial encounter: Secondary | ICD-10-CM | POA: Diagnosis not present

## 2013-07-20 DIAGNOSIS — T839XXA Unspecified complication of genitourinary prosthetic device, implant and graft, initial encounter: Secondary | ICD-10-CM

## 2013-07-20 DIAGNOSIS — Z954 Presence of other heart-valve replacement: Secondary | ICD-10-CM | POA: Insufficient documentation

## 2013-07-20 DIAGNOSIS — I5042 Chronic combined systolic (congestive) and diastolic (congestive) heart failure: Secondary | ICD-10-CM | POA: Insufficient documentation

## 2013-07-20 DIAGNOSIS — I4891 Unspecified atrial fibrillation: Secondary | ICD-10-CM | POA: Diagnosis not present

## 2013-07-20 DIAGNOSIS — Z7901 Long term (current) use of anticoagulants: Secondary | ICD-10-CM | POA: Diagnosis not present

## 2013-07-20 DIAGNOSIS — I129 Hypertensive chronic kidney disease with stage 1 through stage 4 chronic kidney disease, or unspecified chronic kidney disease: Secondary | ICD-10-CM | POA: Diagnosis not present

## 2013-07-20 DIAGNOSIS — I10 Essential (primary) hypertension: Secondary | ICD-10-CM | POA: Diagnosis not present

## 2013-07-20 DIAGNOSIS — T85698A Other mechanical complication of other specified internal prosthetic devices, implants and grafts, initial encounter: Secondary | ICD-10-CM | POA: Diagnosis not present

## 2013-07-20 NOTE — ED Notes (Signed)
Pt's foley cath bag switched out to a new bag.

## 2013-07-20 NOTE — ED Notes (Signed)
Pt states that he has bladder cancer that he is not being treated for.  Pt states that he stepped on it the other day.  States that yesterday, he started urinating around the catheter.  Pt also states that he drug it on the ground and now the bag has a hole in it.

## 2013-07-20 NOTE — ED Provider Notes (Signed)
CSN: [id number]     Arrival date & time 07/20/13  0841 History   First MD Initiated Contact with Patient 07/20/13 [phone]     Chief Complaint  Patient presents with  . Bladder Cancer  . Foley Catheter Problem      (Consider location/radiation/quality/duration/timing/severity/associated sxs/prior Treatment) HPI Comments: Has been stepping on his foley for the past few days. Walks on it because he's unable to use a leg bag because he cannot change it with his arthritis. Has indwelling Foley since February due to bladder cancer they are not treating due to poor cardiac history making him a poor surgical candidate.  Patient is a 80 y.o. male presenting with male genitourinary complaint. The history is provided by the patient.  Male GU Problem Presenting symptoms: no dysuria and no penile discharge   Presenting symptoms comment:  Foley leaking Context: spontaneously (has walked on Foley and stepped on it, causing tears in the bag)   Relieved by:  Nothing Worsened by:  Nothing tried Associated symptoms: no abdominal pain, no fever, no nausea and no vomiting     Past Medical History  Diagnosis Date  . Chronic atrial fibrillation   . Chronic anticoagulation     a. afib/st. jude avr - goal 2.5-3.5   . BPH (benign prostatic hypertrophy)   . Hypertension   . History of kidney stones   . Gout   . Syncope   . History of skin cancer   . Bladder tumor     a. s/p resection 02/19/2013.  [name] Hx of scarlet fever   . Seizures 2014    evaluated by Dr. Jannifer Franklin at [facility] neurology -  no cause determined - refered back to cardiologist - recommended loop recorder to monitor rythm but pt refused - continues to have seizures per family - notes in EPIC  . Chronic combined systolic and diastolic CHF (congestive heart failure)     a. EF 45 to 50% per echo in 2011 following exacerbation  . Chronic kidney disease    Past Surgical History  Procedure Laterality Date  . Aortic valve replacement  1987    #21 MM  ST JUDE PLACED BY DR.CHARLES WILSON  . Cataract extraction    . Transurethral resection of prostate N/A 02/19/2013    Procedure: TRANSURETHRAL RESECTION OF THE PROSTATE /bladder WITH GYRUS INSTRUMENTS;  Surgeon: Irine Seal, MD;  Location: [facility];  Service: Urology;  Laterality: N/A;  . Cystoscopy N/A 02/19/2013    Procedure: CYSTOSCOPY;  Surgeon: Irine Seal, MD;  Location: [facility];  Service: Urology;  Laterality: N/A;   Family History  Problem Relation Age of Onset  . Cancer Mother   . Heart disease Father   . Asthma Father   . Stroke Father   . Dementia Sister   . Heart disease Brother    History  Substance Use Topics  . Smoking status: Former Smoker    Quit date: 01/10/1971  . Smokeless tobacco: Never Used  . Alcohol Use: No    Review of Systems  Constitutional: Negative for fever.  Gastrointestinal: Negative for nausea, vomiting and abdominal pain.  Genitourinary: Negative for dysuria and discharge.  All other systems reviewed and are negative.     Allergies  Ace inhibitors; Codeine; and Sulfa drugs cross reactors  Home Medications   Prior to Admission medications   Medication Sig Start Date End Date Taking? Authorizing Provider  carvedilol (COREG) 6.25 MG tablet Take 6.25 mg by mouth 2 (two) times daily with a meal.  Yes Historical Provider, MD  feeding supplement, ENSURE COMPLETE, (ENSURE COMPLETE) LIQD Take 237 mLs by mouth 2 (two) times daily between meals. 03/15/13  Yes Robbie Lis, MD  finasteride (PROSCAR) 5 MG tablet Take 5 mg by mouth every morning.    Yes Historical Provider, MD  furosemide (LASIX) 40 MG tablet Take 1 tablet (40 mg total) by mouth daily as needed. 05/19/13  Yes Burtis Junes, NP  simvastatin (ZOCOR) 10 MG tablet Take 1 tablet (10 mg total) by mouth at bedtime. 06/13/13  Yes Minus Breeding, MD  terazosin (HYTRIN) 5 MG capsule Take 5 mg by mouth at bedtime.     Yes Historical Provider, MD  warfarin (COUMADIN) 5 MG tablet Take 2.5-5 mg by mouth  daily.   Yes Historical Provider, MD   BP 133/78  Pulse 73  Temp(Src) 97.5 F (36.4 C)  Resp 18  SpO2 100% Physical Exam  Nursing note and vitals reviewed. Constitutional: He is oriented to person, place, and time. He appears well-developed and well-nourished. No distress.  HENT:  Head: Normocephalic and atraumatic.  Mouth/Throat: No oropharyngeal exudate.  Eyes: EOM are normal. Pupils are equal, round, and reactive to light.  Neck: Normal range of motion. Neck supple.  Cardiovascular: Normal rate and regular rhythm.  Exam reveals no friction rub.   No murmur heard. Pulmonary/Chest: Effort normal and breath sounds normal. No respiratory distress. He has no wheezes. He has no rales.  Abdominal: He exhibits no distension. There is no tenderness. There is no rebound.  Genitourinary:  Foley in place    Musculoskeletal: Normal range of motion. He exhibits no edema.  Neurological: He is alert and oriented to person, place, and time.  Skin: He is not diaphoretic.    ED Course  Procedures (including critical care time) Labs Review Labs Reviewed - No data to display  Imaging Review No results found.   EKG Interpretation None      MDM   Final diagnoses:  Foley catheter problem, initial encounter    8M with bladder cancer and chronic foley presents with foley problem. He has been stepping on the bag and it is leaking. Unable to use the leg bag because him and his wife are unable to change the regular bag to the leg bag. Chronic foley due to bladder cancer - poor surgical candidate so not treating the bladder cancer. Patient has stable vitals, denies fevers, abdominal pain, vomiting.  Given new foley equipment, producing urine well. Has f/u with Urology on 7/20. Stable for discharge.    Osvaldo Shipper, MD 07/20/13 1027

## 2013-07-21 ENCOUNTER — Ambulatory Visit (INDEPENDENT_AMBULATORY_CARE_PROVIDER_SITE_OTHER): Payer: Medicare Other | Admitting: Nurse Practitioner

## 2013-07-21 ENCOUNTER — Encounter: Payer: Self-pay | Admitting: Nurse Practitioner

## 2013-07-21 ENCOUNTER — Ambulatory Visit (INDEPENDENT_AMBULATORY_CARE_PROVIDER_SITE_OTHER): Payer: Medicare Other | Admitting: Pharmacist

## 2013-07-21 ENCOUNTER — Telehealth: Payer: Self-pay | Admitting: Nurse Practitioner

## 2013-07-21 ENCOUNTER — Other Ambulatory Visit: Payer: Self-pay | Admitting: *Deleted

## 2013-07-21 VITALS — BP 120/70 | HR 64 | Ht 69.0 in | Wt 170.0 lb

## 2013-07-21 DIAGNOSIS — I482 Chronic atrial fibrillation, unspecified: Secondary | ICD-10-CM

## 2013-07-21 DIAGNOSIS — Z954 Presence of other heart-valve replacement: Secondary | ICD-10-CM | POA: Diagnosis not present

## 2013-07-21 DIAGNOSIS — Z952 Presence of prosthetic heart valve: Secondary | ICD-10-CM

## 2013-07-21 DIAGNOSIS — I4891 Unspecified atrial fibrillation: Secondary | ICD-10-CM

## 2013-07-21 DIAGNOSIS — E785 Hyperlipidemia, unspecified: Secondary | ICD-10-CM

## 2013-07-21 DIAGNOSIS — Z7901 Long term (current) use of anticoagulants: Secondary | ICD-10-CM | POA: Diagnosis not present

## 2013-07-21 LAB — BASIC METABOLIC PANEL
BUN: 26 mg/dL — ABNORMAL HIGH (ref 6–23)
CO2: 28 mEq/L (ref 19–32)
Calcium: 8.3 mg/dL — ABNORMAL LOW (ref 8.4–10.5)
Chloride: 107 mEq/L (ref 96–112)
Creatinine, Ser: 1.3 mg/dL (ref 0.4–1.5)
GFR: 57.44 mL/min — ABNORMAL LOW (ref >60.00)
Glucose, Bld: 97 mg/dL (ref 70–99)
Potassium: 4.4 mEq/L (ref 3.5–5.1)
Sodium: 142 mEq/L (ref 135–145)

## 2013-07-21 LAB — LIPID PANEL
Cholesterol: 135 mg/dL (ref 0–200)
HDL: 37.6 mg/dL — ABNORMAL LOW (ref >39.00)
LDL Cholesterol: 81 mg/dL (ref 0–99)
NonHDL: 97.4
Total CHOL/HDL Ratio: 4
Triglycerides: 84 mg/dL (ref 0.0–149.0)
VLDL: 16.8 mg/dL (ref 0.0–40.0)

## 2013-07-21 LAB — CBC
HCT: 33.7 % — ABNORMAL LOW (ref 39.0–52.0)
Hemoglobin: 10.7 g/dL — ABNORMAL LOW (ref 13.0–17.0)
MCHC: 31.7 g/dL (ref 30.0–36.0)
MCV: 91.4 fl (ref 78.0–100.0)
Platelets: 165 10*3/uL (ref 150.0–400.0)
RBC: 3.69 Mil/uL — ABNORMAL LOW (ref 4.22–5.81)
RDW: 16.1 % — ABNORMAL HIGH (ref 11.5–15.5)
WBC: 7.2 10*3/uL (ref 4.0–10.5)

## 2013-07-21 LAB — HEPATIC FUNCTION PANEL
ALT: 7 U/L (ref 0–53)
AST: 17 U/L (ref 0–37)
Albumin: 3.7 g/dL (ref 3.5–5.2)
Alkaline Phosphatase: 76 U/L (ref 39–117)
Bilirubin, Direct: 0.1 mg/dL (ref 0.0–0.3)
Total Bilirubin: 0.5 mg/dL (ref 0.2–1.2)
Total Protein: 7.2 g/dL (ref 6.0–8.3)

## 2013-07-21 LAB — POCT INR: INR: 3.9

## 2013-07-21 MED ORDER — DILTIAZEM HCL ER 240 MG PO CP24: 240.0000 mg | ORAL_CAPSULE | Freq: Every day | ORAL | Status: DC

## 2013-07-21 NOTE — Telephone Encounter (Signed)
I added it back to his list. He needs to continue to take.

## 2013-07-21 NOTE — Telephone Encounter (Signed)
New message     Diltiazem 240mg  is what the patient is taking.  He saw Cecille Rubin today and was supposed to call back with this info

## 2013-07-21 NOTE — Progress Notes (Signed)
Eric Wheeler Date of Birth: 1919-05-09 Medical Record #[id number]  History of Present Illness: Eric Wheeler is seen back today for a follow up/post hospital visit. This is a 2 month check. Seen for Dr. Percival Spanish - who has not yet met - former patient of Dr. Susa Simmonds. He is 80 years of age. He has chronic systolic HF with EF of 45 to 50% (per echo 02/2013) with prior AVR in 1987, HTN and in chronic atrial fib - managed with rate control and coumadin. He is intolerant to ACE due to dizziness.   Most recently has had surgery for a large bladder tumor - complicated by hematuria, obstruction, renal failure, AF with RVR - he has been back to the hospital several times. He has opted for conservative management with no plans for further treatment/chemo.  Back in the ER over the weekend with malfunction of his foley.   Comes back today. Here with son-in-law and wife. Continues to do very well. Back to all his basic activities. Mowing. On the tractor. No chest pain. Not short of breath. Weight stable at home. Not using any lasix. No palpitations. HR stable.   Current Outpatient Prescriptions  Medication Sig Dispense Refill  . carvedilol (COREG) 6.25 MG tablet Take 6.25 mg by mouth 2 (two) times daily with a meal.      . feeding supplement, ENSURE COMPLETE, (ENSURE COMPLETE) LIQD Take 237 mLs by mouth 2 (two) times daily between meals.  10 Bottle  0  . finasteride (PROSCAR) 5 MG tablet Take 5 mg by mouth every morning.       . furosemide (LASIX) 40 MG tablet Take 1 tablet (40 mg total) by mouth daily as needed.  90 tablet  3  . simvastatin (ZOCOR) 10 MG tablet Take 1 tablet (10 mg total) by mouth at bedtime.  30 tablet  1  . terazosin (HYTRIN) 5 MG capsule Take 5 mg by mouth at bedtime.        [name] warfarin (COUMADIN) 5 MG tablet Take 2.5-5 mg by mouth daily.      [name] diltiazem (DILACOR XR) 240 MG 24 hr capsule Take 1 capsule (240 mg total) by mouth daily.  30 capsule     No current facility-administered  medications for this visit.    Allergies  Allergen Reactions  . Ace Inhibitors Other (See Comments)    Dizziness   . Codeine Nausea And Vomiting  . Sulfa Drugs Cross Reactors Nausea And Vomiting    Past Medical History  Diagnosis Date  . Chronic atrial fibrillation   . Chronic anticoagulation     a. afib/st. jude avr - goal 2.5-3.5   . BPH (benign prostatic hypertrophy)   . Hypertension   . History of kidney stones   . Gout   . Syncope   . History of skin cancer   . Bladder tumor     a. s/p resection 02/19/2013.  [name] Hx of scarlet fever   . Seizures 2014    evaluated by Dr. Jannifer Franklin at [facility] neurology -  no cause determined - refered back to cardiologist - recommended loop recorder to monitor rythm but pt refused - continues to have seizures per family - notes in EPIC  . Chronic combined systolic and diastolic CHF (congestive heart failure)     a. EF 45 to 50% per echo in 2011 following exacerbation  . Chronic kidney disease     Past Surgical History  Procedure Laterality Date  . Aortic valve replacement  1987    #  [place] BY DR.CHARLES WILSON  . Cataract extraction    . Transurethral resection of prostate N/A 02/19/2013    Procedure: TRANSURETHRAL RESECTION OF THE PROSTATE /bladder WITH GYRUS INSTRUMENTS;  Surgeon: Irine Seal, MD;  Location: [facility];  Service: Urology;  Laterality: N/A;  . Cystoscopy N/A 02/19/2013    Procedure: CYSTOSCOPY;  Surgeon: Irine Seal, MD;  Location: [facility];  Service: Urology;  Laterality: N/A;    History  Smoking status  . Former Smoker  . Quit date: 01/10/1971  Smokeless tobacco  . Never Used    History  Alcohol Use No    Family History  Problem Relation Age of Onset  . Cancer Mother   . Heart disease Father   . Asthma Father   . Stroke Father   . Dementia Sister   . Heart disease Brother     Review of Systems: The review of systems is per the HPI.  All other systems were reviewed and are negative.  Physical  Exam: BP 120/70  Pulse 64  Ht _0  (1.753 m)  Wt 170 lb (77.111 kg)  BMI 25.09 kg/m2 Patient is very pleasant and in no acute distress. Skin is warm and dry. Color is normal.  HEENT is unremarkable. Normocephalic/atraumatic. PERRL. Sclera are nonicteric. Neck is supple. No masses. No JVD. Lungs are clear. Cardiac exam shows an irregular rhythm. His rate is ok. Abdomen is soft. Extremities are without edema. Gait and ROM are intact. No gross neurologic deficits noted.  Wt Readings from Last 3 Encounters:  07/21/13 170 lb (77.111 kg)  05/19/13 167 lb 12.8 oz (76.114 kg)  04/25/13 162 lb 12.8 oz (73.846 kg)    LABORATORY DATA/PROCEDURES: PENDING  Lab Results  Component Value Date   WBC 9.2 04/25/2013   HGB 10.9* 04/25/2013   HCT 33.0* 04/25/2013   PLT 210.0 04/25/2013   GLUCOSE 155* 04/25/2013   CHOL 148 09/20/2012   TRIG 97.0 09/20/2012   HDL 33.50* 09/20/2012   LDLCALC 95 09/20/2012   ALT 11 02/28/2013   AST 9 02/28/2013   NA 141 04/25/2013   K 4.0 04/25/2013   CL 106 04/25/2013   CREATININE 1.4 04/25/2013   BUN 39* 04/25/2013   CO2 27 04/25/2013   TSH 2.843 02/27/2013   INR 3.7 07/02/2013    Lab Results  Component Value Date   INR 3.7 07/02/2013   INR 2.3 06/18/2013   INR 1.6 06/04/2013     BNP (last 3 results)  Recent Labs  04/20/13 1106  PROBNP 5284.0*    Echo Study Conclusions from February 2015  - Left ventricle: The cavity size was normal. Wall thickness was normal. Systolic function was mildly reduced. The estimated ejection fraction was in the range of 45% to 50%. Diffuse hypokinesis. Indeterminant diastolic function (atrial fibrillation). - Aortic valve: St Jude mechanical aortic valve. No significant prosthetic valve regurgitation or stenosis. Mean gradient: 39m Hg (S). - Mitral valve: Moderately calcified annulus. Moderately calcified leaflets . Mild to moderate regurgitation. - Left atrium: The atrium was mildly to moderately dilated. - Right ventricle: The  cavity size was mildly dilated. Systolic function was normal. - Right atrium: The atrium was moderately dilated. - Tricuspid valve: Peak RV-RA gradient: 316mHg (S). Maximal regurgitant velocity: 292cm/s. - Pulmonary arteries: PA peak pressure: 5048mg (S). - Systemic veins: IVC measured 2.3 cm with < 50% respirophasic variation, suggesting RA pressure 15 mmHg. Impressions:  - The patient was in atrial fibrillation. Normal LV  size with EF 45-50%, mild diffuse hypokinesis. Mild to moderate MR. St Jude mechanical aortic valve appeared to function normally. Mildly dilated RV with normal systolic function. Moderate pulmonary hypertension.  Assessment / Plan:  1. Chronic atrial fib - rate is controlled - he is asymptomatic. I have left him on his current regimen.   2. Recent resection of large bladder tumor - complicated by renal failure, UTI, urinary retention - sees Dr. [name] -  no further therapy for his bladder cancer. I do not see where he has much, if any, reserve for continued complications.   3. Prior AVR   4. Systolic HF - using lasix/potassium prn.   5. Advanced age  50 his baseline labs today. He looks good. See back in 4 months.  Patient is agreeable to this plan and will call if any problems develop in the interim.   Burtis Junes, RN, [place] [address] [place], [place]  [address] [phone]

## 2013-07-21 NOTE — Patient Instructions (Signed)
We will check labs today.   We will check your coumadin today  I will see you 4 months  Call the [place] office at [phone] if you have any questions, problems or concerns.

## 2013-07-28 DIAGNOSIS — R339 Retention of urine, unspecified: Secondary | ICD-10-CM | POA: Diagnosis not present

## 2013-08-05 ENCOUNTER — Ambulatory Visit (INDEPENDENT_AMBULATORY_CARE_PROVIDER_SITE_OTHER): Payer: Medicare Other

## 2013-08-05 DIAGNOSIS — Z954 Presence of other heart-valve replacement: Secondary | ICD-10-CM

## 2013-08-05 DIAGNOSIS — I4891 Unspecified atrial fibrillation: Secondary | ICD-10-CM | POA: Diagnosis not present

## 2013-08-05 DIAGNOSIS — Z952 Presence of prosthetic heart valve: Secondary | ICD-10-CM

## 2013-08-05 DIAGNOSIS — Z7901 Long term (current) use of anticoagulants: Secondary | ICD-10-CM | POA: Diagnosis not present

## 2013-08-05 DIAGNOSIS — I482 Chronic atrial fibrillation, unspecified: Secondary | ICD-10-CM

## 2013-08-05 LAB — POCT INR: INR: 3.7

## 2013-08-10 ENCOUNTER — Emergency Department (HOSPITAL_COMMUNITY)
Admission: EM | Admit: 2013-08-10 | Discharge: 2013-08-10 | Disposition: A | Discharge status: Home / Self Care | Payer: Medicare Other | Attending: Emergency Medicine | Admitting: Emergency Medicine

## 2013-08-10 ENCOUNTER — Encounter (HOSPITAL_COMMUNITY): Payer: Self-pay | Admitting: Emergency Medicine

## 2013-08-10 DIAGNOSIS — I4891 Unspecified atrial fibrillation: Secondary | ICD-10-CM | POA: Insufficient documentation

## 2013-08-10 DIAGNOSIS — Y846 Urinary catheterization as the cause of abnormal reaction of the patient, or of later complication, without mention of misadventure at the time of the procedure: Secondary | ICD-10-CM | POA: Diagnosis not present

## 2013-08-10 DIAGNOSIS — Z8551 Personal history of malignant neoplasm of bladder: Secondary | ICD-10-CM | POA: Insufficient documentation

## 2013-08-10 DIAGNOSIS — T83091A Other mechanical complication of indwelling urethral catheter, initial encounter: Secondary | ICD-10-CM | POA: Diagnosis not present

## 2013-08-10 DIAGNOSIS — Z85828 Personal history of other malignant neoplasm of skin: Secondary | ICD-10-CM | POA: Insufficient documentation

## 2013-08-10 DIAGNOSIS — I129 Hypertensive chronic kidney disease with stage 1 through stage 4 chronic kidney disease, or unspecified chronic kidney disease: Secondary | ICD-10-CM | POA: Insufficient documentation

## 2013-08-10 DIAGNOSIS — Z9889 Other specified postprocedural states: Secondary | ICD-10-CM | POA: Insufficient documentation

## 2013-08-10 DIAGNOSIS — Z79899 Other long term (current) drug therapy: Secondary | ICD-10-CM | POA: Diagnosis not present

## 2013-08-10 DIAGNOSIS — I5042 Chronic combined systolic (congestive) and diastolic (congestive) heart failure: Secondary | ICD-10-CM | POA: Insufficient documentation

## 2013-08-10 DIAGNOSIS — Z87442 Personal history of urinary calculi: Secondary | ICD-10-CM | POA: Insufficient documentation

## 2013-08-10 DIAGNOSIS — Z87891 Personal history of nicotine dependence: Secondary | ICD-10-CM | POA: Insufficient documentation

## 2013-08-10 DIAGNOSIS — N189 Chronic kidney disease, unspecified: Secondary | ICD-10-CM | POA: Diagnosis not present

## 2013-08-10 DIAGNOSIS — Z7901 Long term (current) use of anticoagulants: Secondary | ICD-10-CM | POA: Insufficient documentation

## 2013-08-10 DIAGNOSIS — Z8669 Personal history of other diseases of the nervous system and sense organs: Secondary | ICD-10-CM | POA: Insufficient documentation

## 2013-08-10 DIAGNOSIS — I10 Essential (primary) hypertension: Secondary | ICD-10-CM | POA: Diagnosis not present

## 2013-08-10 DIAGNOSIS — T839XXA Unspecified complication of genitourinary prosthetic device, implant and graft, initial encounter: Secondary | ICD-10-CM

## 2013-08-10 LAB — URINALYSIS, ROUTINE W REFLEX MICROSCOPIC
Bilirubin Urine: NEGATIVE
Glucose, UA: NEGATIVE mg/dL
Ketones, ur: NEGATIVE mg/dL
Nitrite: NEGATIVE
PH: 8.5 — AB (ref 5.0–8.0)
Protein, ur: 30 mg/dL — AB
SPECIFIC GRAVITY, URINE: 1.01 (ref 1.005–1.030)
Urobilinogen, UA: 1 mg/dL (ref 0.0–1.0)

## 2013-08-10 LAB — URINE MICROSCOPIC-ADD ON

## 2013-08-10 LAB — PROTIME-INR
INR: 1.83 — AB (ref 0.00–1.49)
PROTHROMBIN TIME: 21.2 s — AB (ref 11.6–15.2)

## 2013-08-10 NOTE — ED Provider Notes (Signed)
CSN: [id number]     Arrival date & time 08/10/13  1005 History   First MD Initiated Contact with Patient 08/10/13 1020     Chief Complaint  Patient presents with  . Urinary Retention     (Consider location/radiation/quality/duration/timing/severity/associated sxs/prior Treatment) HPI Comments: Pt states his foley is not draining into the bag. He doesn't feel like his bladder is full and he thinks he is making urine. Denies fever, chills, n/v, d/a. Daughter reports his INR has been high lately. He has known bladder cancer which they have chosen not to treat. Foley lasted changed 2 weeks ago.   The history is provided by the patient and a relative. No language interpreter was used.    Past Medical History  Diagnosis Date  . Chronic atrial fibrillation   . Chronic anticoagulation     a. afib/st. jude avr - goal 2.5-3.5   . BPH (benign prostatic hypertrophy)   . Hypertension   . History of kidney stones   . Gout   . Syncope   . History of skin cancer   . Bladder tumor     a. s/p resection 02/19/2013.  [name] Hx of scarlet fever   . Seizures 2014    evaluated by Dr. Jannifer Franklin at [facility] neurology -  no cause determined - refered back to cardiologist - recommended loop recorder to monitor rythm but pt refused - continues to have seizures per family - notes in EPIC  . Chronic combined systolic and diastolic CHF (congestive heart failure)     a. EF 45 to 50% per echo in 2011 following exacerbation  . Chronic kidney disease    Past Surgical History  Procedure Laterality Date  . Aortic valve replacement  1987    #21 MM ST JUDE PLACED BY DR.CHARLES WILSON  . Cataract extraction    . Transurethral resection of prostate N/A 02/19/2013    Procedure: TRANSURETHRAL RESECTION OF THE PROSTATE /bladder WITH GYRUS INSTRUMENTS;  Surgeon: Irine Seal, MD;  Location: [facility];  Service: Urology;  Laterality: N/A;  . Cystoscopy N/A 02/19/2013    Procedure: CYSTOSCOPY;  Surgeon: Irine Seal, MD;  Location: [facility]  [facility];  Service: Urology;  Laterality: N/A;   Family History  Problem Relation Age of Onset  . Cancer Mother   . Heart disease Father   . Asthma Father   . Stroke Father   . Dementia Sister   . Heart disease Brother    History  Substance Use Topics  . Smoking status: Former Smoker    Quit date: 01/10/1971  . Smokeless tobacco: Never Used  . Alcohol Use: No    Review of Systems  Constitutional: Negative for fever, activity change, appetite change and fatigue.  HENT: Negative for congestion, facial swelling, rhinorrhea and trouble swallowing.   Eyes: Negative for photophobia and pain.  Respiratory: Negative for cough, chest tightness and shortness of breath.   Cardiovascular: Negative for chest pain and leg swelling.  Gastrointestinal: Negative for nausea, vomiting, abdominal pain, diarrhea and constipation.  Endocrine: Negative for polydipsia and polyuria.  Genitourinary: Negative for dysuria, urgency, decreased urine volume and difficulty urinating.  Musculoskeletal: Negative for back pain and gait problem.  Skin: Negative for color change, rash and wound.  Allergic/Immunologic: Negative for immunocompromised state.  Neurological: Negative for dizziness, facial asymmetry, speech difficulty, weakness, numbness and headaches.  Psychiatric/Behavioral: Negative for confusion, decreased concentration and agitation.      Allergies  Ace inhibitors; Codeine; and Sulfa drugs cross reactors  Home Medications  Prior to Admission medications   Medication Sig Start Date End Date Taking? Authorizing Provider  carvedilol (COREG) 6.25 MG tablet Take 6.25 mg by mouth 2 (two) times daily with a meal.   Yes Historical Provider, MD  diltiazem (DILACOR XR) 240 MG 24 hr capsule Take 1 capsule (240 mg total) by mouth daily. 07/21/13  Yes Burtis Junes, NP  feeding supplement, ENSURE COMPLETE, (ENSURE COMPLETE) LIQD Take 237 mLs by mouth 2 (two) times daily between meals. 03/15/13  Yes Robbie Lis, MD  finasteride (PROSCAR) 5 MG tablet Take 5 mg by mouth every morning.    Yes Historical Provider, MD  furosemide (LASIX) 40 MG tablet Take 1 tablet (40 mg total) by mouth daily as needed. 05/19/13  Yes Burtis Junes, NP  potassium chloride (K-DUR) 10 MEQ tablet Take 10 mEq by mouth daily as needed (for fluid).   Yes Historical Provider, MD  simvastatin (ZOCOR) 10 MG tablet Take 1 tablet (10 mg total) by mouth at bedtime. 06/13/13  Yes Minus Breeding, MD  terazosin (HYTRIN) 5 MG capsule Take 5 mg by mouth at bedtime.     Yes Historical Provider, MD  warfarin (COUMADIN) 5 MG tablet Take 2.5-5 mg by mouth daily. Takes 2.5mg  everyday except every 4th day takes 5mg    Yes Historical Provider, MD   BP 133/55  Pulse 79  Temp(Src) 97.8 F (36.6 C) (Oral)  Resp 16  SpO2 98% Physical Exam  Constitutional: He is oriented to person, place, and time. He appears well-developed and well-nourished. No distress.  HENT:  Head: Normocephalic and atraumatic.  Mouth/Throat: No oropharyngeal exudate.  Eyes: Pupils are equal, round, and reactive to light.  Neck: Normal range of motion. Neck supple.  Cardiovascular: Normal rate, regular rhythm and normal heart sounds.  Exam reveals no gallop and no friction rub.   No murmur heard. Pulmonary/Chest: Effort normal and breath sounds normal. No respiratory distress. He has no wheezes. He has no rales.  Abdominal: Soft. Bowel sounds are normal. He exhibits no distension and no mass. There is no tenderness. There is no rebound and no guarding.  Genitourinary:  Urine draining into foley from proximal end, but appears clogged by sediment at distal end.   Musculoskeletal: Normal range of motion. He exhibits no edema and no tenderness.  Neurological: He is alert and oriented to person, place, and time.  Skin: Skin is warm and dry.  Psychiatric: He has a normal mood and affect.    ED Course  Procedures (including critical care time) Labs Review Labs Reviewed   PROTIME-INR - Abnormal; Notable for the following:    Prothrombin Time 21.2 (*)    INR 1.83 (*)    All other components within normal limits  URINALYSIS, ROUTINE W REFLEX MICROSCOPIC - Abnormal; Notable for the following:    APPearance TURBID (*)    pH 8.5 (*)    Hgb urine dipstick SMALL (*)    Protein, ur 30 (*)    Leukocytes, UA SMALL (*)    All other components within normal limits  URINE CULTURE  URINE MICROSCOPIC-ADD ON    Imaging Review No results found.   EKG Interpretation None      MDM   Final diagnoses:  Foley catheter problem, initial encounter   Pt is a 80 y.o. male with Pmhx as above including known bladder cancer (not being treated), chronic indwelling foley, who presents with clogged foley. He denies bladder pressure of fullness, fever, chills, n/v/d. On PE, VSS,  pt in NAD. He has sediment in foley tubing which appears to be blockage drainage as the tubing enters the foley bag. Urine draining from proximal portion of tubing. Abdomen benign. Will change tubing, sent UA. Will also send PT INR as daughter reports INR has been elevated recently.   After tubing change foley draining easily. UA not clearly infected, culture sent. INR slightly low. Pt recently told to skip pill due to elevated INR, but is now back on home dose. Have asked him to recheck in 1 week. Return precautions given for new or worsening symptoms including worsening pain, fever, inability to tolerate PO.  Neta Ehlers, MD 08/10/13 2042

## 2013-08-10 NOTE — ED Notes (Signed)
He states he has a foley catheter (and has had it since Feb., at which time he underwent a procedure for bladder cancer by Dr. [name]).  He is here today with c/o that his foley is "clogged up".  He is in no distress and c/o mild "pressure" in bladder area.

## 2013-08-10 NOTE — Discharge Instructions (Signed)
Foley Catheter Care °A Foley catheter is a soft, flexible tube that is placed into the bladder to drain urine. A Foley catheter may be inserted if: °· You leak urine or are not able to control when you urinate (urinary incontinence). °· You are not able to urinate when you need to (urinary retention). °· You had prostate surgery or surgery on the genitals. °· You have certain medical conditions, such as multiple sclerosis, dementia, or a spinal cord injury. °If you are going home with a Foley catheter in place, follow the instructions below. °TAKING CARE OF THE CATHETER °1. Wash your hands with soap and water. °2. Using mild soap and warm water on a clean washcloth: °· Clean the area on your body closest to the catheter insertion site using a circular motion, moving away from the catheter. Never wipe toward the catheter because this could sweep bacteria up into the urethra and cause infection. °· Remove all traces of soap. Pat the area dry with a clean towel. For males, reposition the foreskin. °3. Attach the catheter to your leg so there is no tension on the catheter. Use adhesive tape or a leg strap. If you are using adhesive tape, remove any sticky residue left behind by the previous tape you used. °4. Keep the drainage bag below the level of the bladder, but keep it off the floor. °5. Check throughout the day to be sure the catheter is working and urine is draining freely. Make sure the tubing does not become kinked. °6. Do not pull on the catheter or try to remove it. Pulling could damage internal tissues. °TAKING CARE OF THE DRAINAGE BAGS °You will be given two drainage bags to take home. One is a large overnight drainage bag, and the other is a smaller leg bag that fits underneath clothing. You may wear the overnight bag at any time, but you should never wear the smaller leg bag at night. Follow the instructions below for how to empty, change, and clean your drainage bags. °Emptying the Drainage Bag °You must  empty your drainage bag when it is  -½ full or at least 2-3 times a day. °1. Wash your hands with soap and water. °2. Keep the drainage bag below your hips, below the level of your bladder. This stops urine from going back into the tubing and into your bladder. °3. Hold the dirty bag over the toilet or a clean container. °4. Open the pour spout at the bottom of the bag and empty the urine into the toilet or container. Do not let the pour spout touch the toilet, container, or any other surface. Doing so can place bacteria on the bag, which can cause an infection. °5. Clean the pour spout with a gauze pad or cotton ball that has rubbing alcohol on it. °6. Close the pour spout. °7. Attach the bag to your leg with adhesive tape or a leg strap. °8. Wash your hands well. °Changing the Drainage Bag °Change your drainage bag once a month or sooner if it starts to smell bad or look dirty. Below are steps to follow when changing the drainage bag. °1. Wash your hands with soap and water. °2. Pinch off the rubber catheter so that urine does not spill out. °3. Disconnect the catheter tube from the drainage tube at the connection valve. Do not let the tubes touch any surface. °4. Clean the end of the catheter tube with an alcohol wipe. Use a different alcohol wipe to clean the   end of the drainage tube. °5. Connect the catheter tube to the drainage tube of the clean drainage bag. °6. Attach the new bag to the leg with adhesive tape or a leg strap. Avoid attaching the new bag too tightly. °7. Wash your hands well. °Cleaning the Drainage Bag °1. Wash your hands with soap and water. °2. Wash the bag in warm, soapy water. °3. Rinse the bag thoroughly with warm water. °4. Fill the bag with a solution of white vinegar and water (1 cup vinegar to 1 qt warm water [.2 L vinegar to 1 L warm water]). Close the bag and soak it for 30 minutes in the solution. °5. Rinse the bag with warm water. °6. Hang the bag to dry with the pour spout open  and hanging downward. °7. Store the clean bag (once it is dry) in a clean plastic bag. °8. Wash your hands well. °PREVENTING INFECTION °· Wash your hands before and after handling your catheter. °· Take showers daily and wash the area where the catheter enters your body. Do not take baths. Replace wet leg straps with dry ones, if this applies. °· Do not use powders, sprays, or lotions on the genital area. Only use creams, lotions, or ointments as directed by your caregiver. °· For females, wipe from front to back after each bowel movement. °· Drink enough fluids to keep your urine clear or pale yellow unless you have a fluid restriction. °· Do not let the drainage bag or tubing touch or lie on the floor. °· Wear cotton underwear to absorb moisture and to keep your skin drier. °SEEK MEDICAL CARE IF:  °· Your urine is cloudy or smells unusually bad. °· Your catheter becomes clogged. °· You are not draining urine into the bag or your bladder feels full. °· Your catheter starts to leak. °SEEK IMMEDIATE MEDICAL CARE IF:  °· You have pain, swelling, redness, or pus where the catheter enters the body. °· You have pain in the abdomen, legs, lower back, or bladder. °· You have a fever. °· You see blood fill the catheter, or your urine is pink or red. °· You have nausea, vomiting, or chills. °· Your catheter gets pulled out. °MAKE SURE YOU:  °· Understand these instructions. °· Will watch your condition. °· Will get help right away if you are not doing well or get worse. °Document Released: 12/26/2004 Document Revised: 05/12/2013 Document Reviewed: 12/18/2011 °ExitCare® Patient Information ©2015 [employer]. This information is not intended to replace advice given to you by your health care provider. Make sure you discuss any questions you have with your health care provider. ° °

## 2013-08-10 NOTE — ED Notes (Signed)
Flushed patients urinal catheter with sterile water. Then replaced urinal bag with a new one.

## 2013-08-11 LAB — URINE CULTURE: Special Requests: NORMAL

## 2013-08-18 DIAGNOSIS — R339 Retention of urine, unspecified: Secondary | ICD-10-CM | POA: Diagnosis not present

## 2013-08-26 ENCOUNTER — Ambulatory Visit (INDEPENDENT_AMBULATORY_CARE_PROVIDER_SITE_OTHER): Payer: Medicare Other | Admitting: *Deleted

## 2013-08-26 ENCOUNTER — Other Ambulatory Visit: Payer: Self-pay

## 2013-08-26 DIAGNOSIS — Z952 Presence of prosthetic heart valve: Secondary | ICD-10-CM

## 2013-08-26 DIAGNOSIS — Z954 Presence of other heart-valve replacement: Secondary | ICD-10-CM

## 2013-08-26 DIAGNOSIS — Z7901 Long term (current) use of anticoagulants: Secondary | ICD-10-CM | POA: Diagnosis not present

## 2013-08-26 DIAGNOSIS — I4891 Unspecified atrial fibrillation: Secondary | ICD-10-CM | POA: Diagnosis not present

## 2013-08-26 DIAGNOSIS — I482 Chronic atrial fibrillation, unspecified: Secondary | ICD-10-CM

## 2013-08-26 LAB — POCT INR: INR: 2.5

## 2013-08-26 MED ORDER — SIMVASTATIN 10 MG PO TABS: 10.0000 mg | ORAL_TABLET | Freq: Every day | ORAL | Status: DC

## 2013-08-31 ENCOUNTER — Other Ambulatory Visit: Payer: Self-pay | Admitting: Internal Medicine

## 2013-09-02 ENCOUNTER — Other Ambulatory Visit: Payer: Self-pay

## 2013-09-02 MED ORDER — DILTIAZEM HCL ER 240 MG PO CP24: 240.0000 mg | ORAL_CAPSULE | Freq: Every day | ORAL | Status: DC

## 2013-09-23 ENCOUNTER — Ambulatory Visit (INDEPENDENT_AMBULATORY_CARE_PROVIDER_SITE_OTHER): Payer: Medicare Other | Admitting: Pharmacist

## 2013-09-23 DIAGNOSIS — I4891 Unspecified atrial fibrillation: Secondary | ICD-10-CM

## 2013-09-23 DIAGNOSIS — Z954 Presence of other heart-valve replacement: Secondary | ICD-10-CM | POA: Diagnosis not present

## 2013-09-23 DIAGNOSIS — Z952 Presence of prosthetic heart valve: Secondary | ICD-10-CM

## 2013-09-23 DIAGNOSIS — Z7901 Long term (current) use of anticoagulants: Secondary | ICD-10-CM | POA: Diagnosis not present

## 2013-09-23 DIAGNOSIS — I482 Chronic atrial fibrillation, unspecified: Secondary | ICD-10-CM

## 2013-09-23 LAB — POCT INR: INR: 2.7

## 2013-10-21 ENCOUNTER — Ambulatory Visit (INDEPENDENT_AMBULATORY_CARE_PROVIDER_SITE_OTHER): Payer: Medicare Other | Admitting: Pharmacist Clinician (PhC)/ Clinical Pharmacy Specialist

## 2013-10-21 DIAGNOSIS — I482 Chronic atrial fibrillation, unspecified: Secondary | ICD-10-CM

## 2013-10-21 DIAGNOSIS — Z952 Presence of prosthetic heart valve: Secondary | ICD-10-CM

## 2013-10-21 DIAGNOSIS — Z7901 Long term (current) use of anticoagulants: Secondary | ICD-10-CM | POA: Diagnosis not present

## 2013-10-21 DIAGNOSIS — Z954 Presence of other heart-valve replacement: Secondary | ICD-10-CM | POA: Diagnosis not present

## 2013-10-21 LAB — POCT INR: INR: 3.1

## 2013-11-19 ENCOUNTER — Encounter: Payer: Self-pay | Admitting: Nurse Practitioner

## 2013-11-19 ENCOUNTER — Ambulatory Visit (INDEPENDENT_AMBULATORY_CARE_PROVIDER_SITE_OTHER): Payer: Medicare Other | Admitting: *Deleted

## 2013-11-19 ENCOUNTER — Ambulatory Visit (INDEPENDENT_AMBULATORY_CARE_PROVIDER_SITE_OTHER): Payer: Medicare Other | Admitting: Nurse Practitioner

## 2013-11-19 VITALS — BP 152/70 | HR 68 | Ht 69.0 in | Wt 168.0 lb

## 2013-11-19 DIAGNOSIS — C679 Malignant neoplasm of bladder, unspecified: Secondary | ICD-10-CM | POA: Diagnosis not present

## 2013-11-19 DIAGNOSIS — Z954 Presence of other heart-valve replacement: Secondary | ICD-10-CM

## 2013-11-19 DIAGNOSIS — Z952 Presence of prosthetic heart valve: Secondary | ICD-10-CM

## 2013-11-19 DIAGNOSIS — Z7901 Long term (current) use of anticoagulants: Secondary | ICD-10-CM

## 2013-11-19 DIAGNOSIS — I482 Chronic atrial fibrillation, unspecified: Secondary | ICD-10-CM

## 2013-11-19 LAB — CBC
HCT: 35.7 % — ABNORMAL LOW (ref 39.0–52.0)
Hemoglobin: 11.4 g/dL — ABNORMAL LOW (ref 13.0–17.0)
MCHC: 31.8 g/dL (ref 30.0–36.0)
MCV: 89.3 fl (ref 78.0–100.0)
Platelets: 129 10*3/uL — ABNORMAL LOW (ref 150.0–400.0)
RBC: 4 Mil/uL — ABNORMAL LOW (ref 4.22–5.81)
RDW: 16.2 % — ABNORMAL HIGH (ref 11.5–15.5)
WBC: 7 10*3/uL (ref 4.0–10.5)

## 2013-11-19 LAB — BASIC METABOLIC PANEL
BUN: 37 mg/dL — ABNORMAL HIGH (ref 6–23)
CO2: 32 mEq/L (ref 19–32)
Calcium: 8.7 mg/dL (ref 8.4–10.5)
Chloride: 107 mEq/L (ref 96–112)
Creatinine, Ser: 1.3 mg/dL (ref 0.4–1.5)
GFR: 53.44 mL/min — ABNORMAL LOW (ref >60.00)
Glucose, Bld: 110 mg/dL — ABNORMAL HIGH (ref 70–99)
Potassium: 4.6 mEq/L (ref 3.5–5.1)
Sodium: 140 mEq/L (ref 135–145)

## 2013-11-19 LAB — POCT INR: INR: 2.4

## 2013-11-19 MED ORDER — SIMVASTATIN 10 MG PO TABS: 10.0000 mg | ORAL_TABLET | Freq: Every day | ORAL | Status: DC

## 2013-11-19 MED ORDER — DILTIAZEM HCL ER 240 MG PO CP24: 240.0000 mg | ORAL_CAPSULE | Freq: Every day | ORAL | Status: DC

## 2013-11-19 MED ORDER — CARVEDILOL 6.25 MG PO TABS: 6.2500 mg | ORAL_TABLET | Freq: Two times a day (BID) | ORAL | Status: DC

## 2013-11-19 NOTE — Progress Notes (Signed)
Eric Wheeler Date of Birth: [birth year] Medical Record #[id number]  History of Present Illness: Eric Wheeler is seen back today for a follow up visit. This is a 4 month check. Seen for Dr. Percival Wheeler - who has not yet met - former patient of Dr. Susa Wheeler. He is 80 years of age. He has chronic systolic HF with EF of 45 to 50% (per echo 02/2013) with prior AVR in 1987, HTN and in chronic atrial fib - managed with rate control and coumadin. He is intolerant to ACE due to dizziness.   He had surgery earlier this year for a large bladder tumor - complicated by hematuria, obstruction, renal failure, AF with RVR - he has been back to the hospital several times. He has opted for conservative management with no plans for further treatment/chemo.  I last saw him in July - he was doing very well. Back to all his basic activities. Mowing. On the tractor.  Comes back today. Here with son-in-law. Continues to do ok. He has turned 89. Seems to be sleeping more during the daytime but still getting on his tractor and mowing.  No chest pain. Not short of breath. Weight stable at home. Not using any lasix or potassium - this is just prn. No palpitations. HR stable. Has had his foley out since August. He is voiding frequently but able to void. Little unsteady. Got a "little too helpful carring groceries in and stumbled". No injury.   Current Outpatient Prescriptions  Medication Sig Dispense Refill  . carvedilol (COREG) 6.25 MG tablet Take 1 tablet (6.25 mg total) by mouth 2 (two) times daily with a meal. 60 tablet 11  . diltiazem (DILACOR XR) 240 MG 24 hr capsule Take 1 capsule (240 mg total) by mouth daily. 30 capsule 11  . feeding supplement, ENSURE COMPLETE, (ENSURE COMPLETE) LIQD Take 237 mLs by mouth 2 (two) times daily between meals. 10 Bottle 0  . finasteride (PROSCAR) 5 MG tablet Take 5 mg by mouth every morning.     . furosemide (LASIX) 40 MG tablet Take 1 tablet (40 mg total) by mouth daily as needed. 90  tablet 3  . potassium chloride (K-DUR) 10 MEQ tablet Take 10 mEq by mouth daily as needed (for fluid).    . simvastatin (ZOCOR) 10 MG tablet Take 1 tablet (10 mg total) by mouth at bedtime. 30 tablet 11  . terazosin (HYTRIN) 5 MG capsule Take 5 mg by mouth at bedtime.      [name] warfarin (COUMADIN) 5 MG tablet Take 2.5-5 mg by mouth daily. Takes 2.18m everyday except every 4th day takes 538m    No current facility-administered medications for this visit.    Allergies  Allergen Reactions  . Ace Inhibitors Other (See Comments)    Dizziness   . Codeine Nausea And Vomiting  . Sulfa Drugs Cross Reactors Nausea And Vomiting    Past Medical History  Diagnosis Date  . Chronic atrial fibrillation   . Chronic anticoagulation     a. afib/st. jude avr - goal 2.5-3.5   . BPH (benign prostatic hypertrophy)   . Hypertension   . History of kidney stones   . Gout   . Syncope   . History of skin cancer   . Bladder tumor     a. s/p resection 02/19/2013.  . Marland Kitchenx of scarlet fever   . Seizures 2014    [facility]  no cause determined - refered back to cardiologist -  recommended loop recorder to monitor rythm but pt refused - continues to have seizures per family - notes in EPIC  . Chronic combined systolic and diastolic CHF (congestive heart failure)     a. EF 45 to 50% per echo in 2011 following exacerbation  . Chronic kidney disease     Past Surgical History  Procedure Laterality Date  . Aortic valve replacement  1987    #21 MM ST JUDE PLACED BY DR.CHARLES Wheeler  . Cataract extraction    . Transurethral resection of prostate N/A 02/19/2013    Procedure: TRANSURETHRAL RESECTION OF THE PROSTATE /bladder WITH GYRUS INSTRUMENTS;  Surgeon: Irine Seal, MD;  Location: [facility];  Service: Urology;  Laterality: N/A;  . Cystoscopy N/A 02/19/2013    Procedure: CYSTOSCOPY;  Surgeon: Irine Seal, MD;  Location: [facility];  Service: Urology;  Laterality: N/A;    History    Smoking status  . Former Smoker  . Quit date: 01/10/1971  Smokeless tobacco  . Never Used    History  Alcohol Use No    Family History  Problem Relation Age of Onset  . Cancer Mother   . Heart disease Father   . Asthma Father   . Stroke Father   . Dementia Sister   . Heart disease Brother     Review of Systems: The review of systems is per the HPI.  All other systems were reviewed and are negative.  Physical Exam: BP 152/70 mmHg  Pulse 68  Ht 5' 9" (1.753 m)  Wt 168 lb (76.204 kg)  BMI 24.80 kg/m2 Patient is very pleasant and in no acute distress. Little hard of hearing. His weight is stable. Skin is warm and dry. Color is normal.  HEENT is unremarkable. Normocephalic/atraumatic. PERRL. Sclera are nonicteric. Neck is supple. No masses. No JVD. Lungs are clear. Cardiac exam shows an irregular rhythm. Rate is ok. Valve is crisp. Abdomen is soft. Extremities are without edema. Gait and ROM are intact. No gross neurologic deficits noted.  Wt Readings from Last 3 Encounters:  11/19/13 168 lb (76.204 kg)  07/21/13 170 lb (77.111 kg)  05/19/13 167 lb 12.8 oz (76.114 kg)    LABORATORY DATA/PROCEDURES:  Lab Results  Component Value Date   WBC 7.2 07/21/2013   HGB 10.7* 07/21/2013   HCT 33.7* 07/21/2013   PLT 165.0 07/21/2013   GLUCOSE 97 07/21/2013   CHOL 135 07/21/2013   TRIG 84.0 07/21/2013   HDL 37.60* 07/21/2013   LDLCALC 81 07/21/2013   ALT 7 07/21/2013   AST 17 07/21/2013   NA 142 07/21/2013   K 4.4 07/21/2013   CL 107 07/21/2013   CREATININE 1.3 07/21/2013   BUN 26* 07/21/2013   CO2 28 07/21/2013   TSH 2.843 02/27/2013   INR 2.4 11/19/2013    BNP (last 3 results)  Recent Labs  04/20/13 1106  PROBNP 5284.0*   Echo Study Conclusions from February 2015  - Left ventricle: The cavity size was normal. Wall thickness was normal. Systolic function was mildly reduced. The estimated ejection fraction was in the range of 45% to 50%. Diffuse hypokinesis.  Indeterminant diastolic function (atrial fibrillation). - Aortic valve: St Jude mechanical aortic valve. No significant prosthetic valve regurgitation or stenosis. Mean gradient: 73m Hg (S). - Mitral valve: Moderately calcified annulus. Moderately calcified leaflets . Mild to moderate regurgitation. - Left atrium: The atrium was mildly to moderately dilated. - Right ventricle: The cavity size was mildly dilated. Systolic function was normal. - Right atrium:  The atrium was moderately dilated. - Tricuspid valve: Peak RV-RA gradient: 31m Hg (S). Maximal regurgitant velocity: 292cm/s. - Pulmonary arteries: PA peak pressure: 59mHg (S). - Systemic veins: IVC measured 2.3 cm with < 50% respirophasic variation, suggesting RA pressure 15 mmHg. Impressions:  - The patient was in atrial fibrillation. Normal LV size with EF 45-50%, mild diffuse hypokinesis. Mild to moderate MR. St Jude mechanical aortic valve appeared to function normally. Mildly dilated RV with normal systolic function. Moderate pulmonary hypertension.  Assessment / Plan:  1. Chronic atrial fib - rate is controlled and he is anticoagulated - he is asymptomatic. I have left him on his current regimen.   2. Prior resection of large bladder tumor - complicated by renal failure, UTI, urinary retention - sees Dr. WrJeffie Pollock no further therapy for his bladder cancer-  Not felt to have reserve for continued complications. His foley is out and he is able to void now.   3. Prior AVR   4. Systolic HF - using lasix/potassium prn and has not had to use in sometime.   5. Advanced age -  Does seem to be getting a little more feeble - but stable overall.  Patient is agreeable to this plan and will call if any problems develop in the interim.   LoBurtis JunesRN, [id number] New Saddle LaneuLindsayrCardingtonNC  [phone]

## 2013-11-19 NOTE — Patient Instructions (Signed)
We will be checking the following labs today BMET and CBC  Stay on your current medicines  I will see you in 6 months  Call the [place] office at [phone] if you have any questions, problems or concerns.

## 2013-12-10 ENCOUNTER — Telehealth: Payer: Self-pay | Admitting: Cardiology

## 2013-12-10 NOTE — Telephone Encounter (Signed)
New Msg   Spoke with pt son Fritz Pickerel who states pt refuses to get flu shot.

## 2013-12-18 ENCOUNTER — Ambulatory Visit (INDEPENDENT_AMBULATORY_CARE_PROVIDER_SITE_OTHER): Payer: Medicare Other | Admitting: *Deleted

## 2013-12-18 DIAGNOSIS — I482 Chronic atrial fibrillation, unspecified: Secondary | ICD-10-CM

## 2013-12-18 DIAGNOSIS — Z7901 Long term (current) use of anticoagulants: Secondary | ICD-10-CM

## 2013-12-18 DIAGNOSIS — Z952 Presence of prosthetic heart valve: Secondary | ICD-10-CM

## 2013-12-18 DIAGNOSIS — Z954 Presence of other heart-valve replacement: Secondary | ICD-10-CM | POA: Diagnosis not present

## 2013-12-18 LAB — POCT INR: INR: 2.3

## 2013-12-30 ENCOUNTER — Ambulatory Visit (INDEPENDENT_AMBULATORY_CARE_PROVIDER_SITE_OTHER): Payer: Medicare Other | Admitting: Pharmacist Clinician (PhC)/ Clinical Pharmacy Specialist

## 2013-12-30 DIAGNOSIS — Z7901 Long term (current) use of anticoagulants: Secondary | ICD-10-CM | POA: Diagnosis not present

## 2013-12-30 DIAGNOSIS — I482 Chronic atrial fibrillation, unspecified: Secondary | ICD-10-CM

## 2013-12-30 DIAGNOSIS — Z954 Presence of other heart-valve replacement: Secondary | ICD-10-CM | POA: Diagnosis not present

## 2013-12-30 DIAGNOSIS — Z952 Presence of prosthetic heart valve: Secondary | ICD-10-CM

## 2013-12-30 LAB — POCT INR: INR: 2.1

## 2014-01-15 ENCOUNTER — Ambulatory Visit (INDEPENDENT_AMBULATORY_CARE_PROVIDER_SITE_OTHER): Payer: Medicare Other | Admitting: Pharmacist

## 2014-01-15 DIAGNOSIS — I482 Chronic atrial fibrillation, unspecified: Secondary | ICD-10-CM

## 2014-01-15 DIAGNOSIS — Z954 Presence of other heart-valve replacement: Secondary | ICD-10-CM

## 2014-01-15 DIAGNOSIS — Z952 Presence of prosthetic heart valve: Secondary | ICD-10-CM

## 2014-01-15 DIAGNOSIS — Z7901 Long term (current) use of anticoagulants: Secondary | ICD-10-CM | POA: Diagnosis not present

## 2014-01-15 LAB — POCT INR: INR: 3.5

## 2014-02-05 ENCOUNTER — Ambulatory Visit (INDEPENDENT_AMBULATORY_CARE_PROVIDER_SITE_OTHER): Payer: Medicare Other | Admitting: *Deleted

## 2014-02-05 DIAGNOSIS — I482 Chronic atrial fibrillation, unspecified: Secondary | ICD-10-CM

## 2014-02-05 DIAGNOSIS — Z954 Presence of other heart-valve replacement: Secondary | ICD-10-CM

## 2014-02-05 DIAGNOSIS — Z7901 Long term (current) use of anticoagulants: Secondary | ICD-10-CM

## 2014-02-05 DIAGNOSIS — Z952 Presence of prosthetic heart valve: Secondary | ICD-10-CM

## 2014-02-05 LAB — POCT INR: INR: 3.9

## 2014-02-05 MED ORDER — WARFARIN SODIUM 5 MG PO TABS: ORAL_TABLET | ORAL | Status: DC

## 2014-02-20 ENCOUNTER — Ambulatory Visit (INDEPENDENT_AMBULATORY_CARE_PROVIDER_SITE_OTHER): Payer: Medicare Other | Admitting: *Deleted

## 2014-02-20 DIAGNOSIS — Z954 Presence of other heart-valve replacement: Secondary | ICD-10-CM | POA: Diagnosis not present

## 2014-02-20 DIAGNOSIS — I482 Chronic atrial fibrillation, unspecified: Secondary | ICD-10-CM

## 2014-02-20 DIAGNOSIS — Z952 Presence of prosthetic heart valve: Secondary | ICD-10-CM

## 2014-02-20 DIAGNOSIS — Z7901 Long term (current) use of anticoagulants: Secondary | ICD-10-CM

## 2014-02-20 LAB — POCT INR: INR: 2.9

## 2014-03-01 IMAGING — CT CT ABD-PELV W/O CM
1 of 2 series · 14 of 32 positions shown, 18 images · non-contrast
Comparison: DG ABD ACUTE W/CHEST dated 02/27/2013; CT ABD-PEL WO/W
CM dated 12/31/2012

CLINICAL DATA: Anuria, history of bladder cancer with removal of
urinary catheter 4 days ago.

EXAM:
CT ABDOMEN AND PELVIS WITHOUT CONTRAST
TECHNIQUE: Multidetector CT imaging of the abdomen and pelvis was performed
following the standard protocol without intravenous contrast.

[Series 2: abd/pel w/o · axial · non-contrast · 0.85mm/px · z∈[+1253,+1638]mm · 14 of 85 slices shown, 18 images]
[im 4/85  soft-tissue]
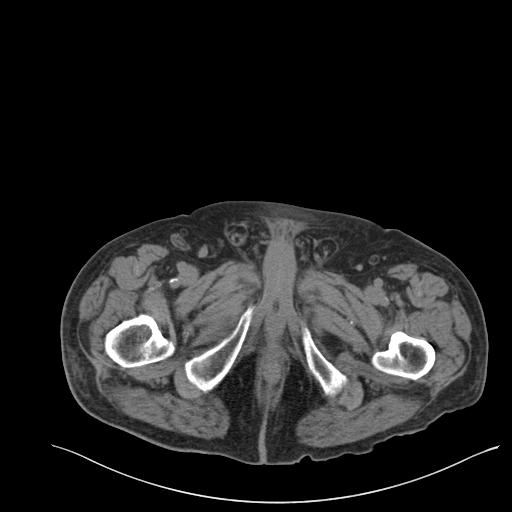
[im 4/85  bone]
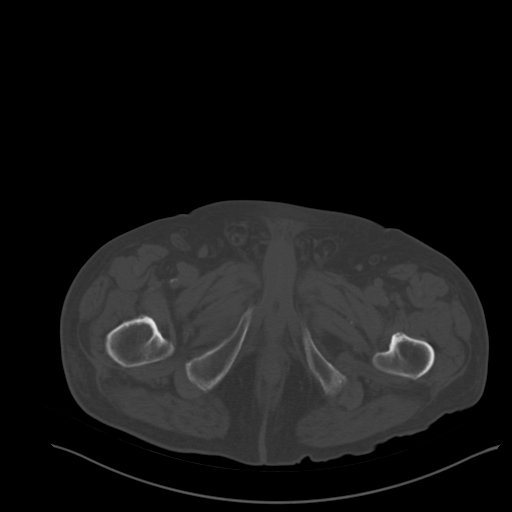
[im 11/85  soft-tissue]
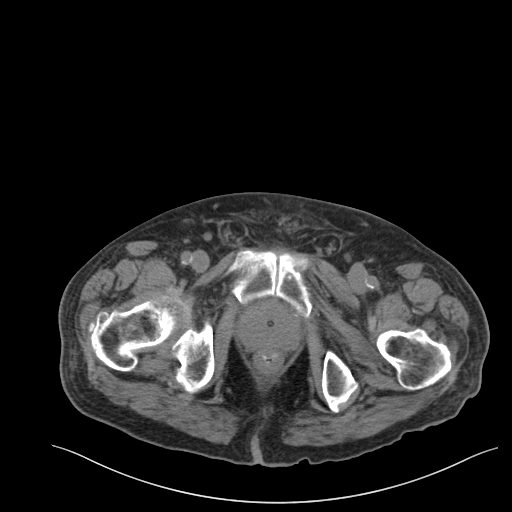
[im 18/85  soft-tissue]
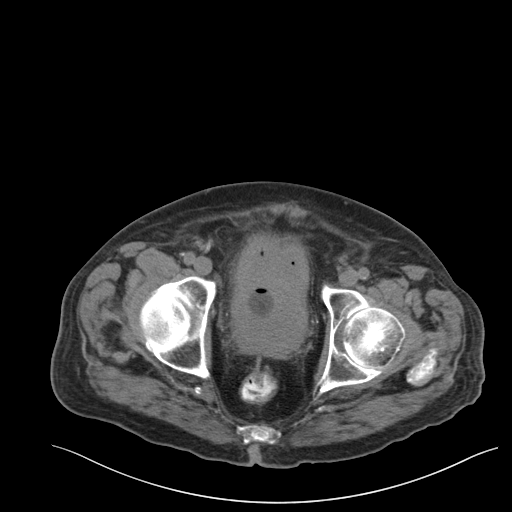
[im 25/85  soft-tissue]
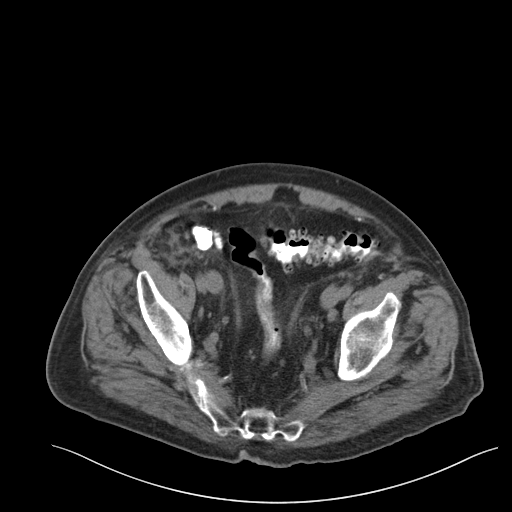
[im 32/85  soft-tissue]
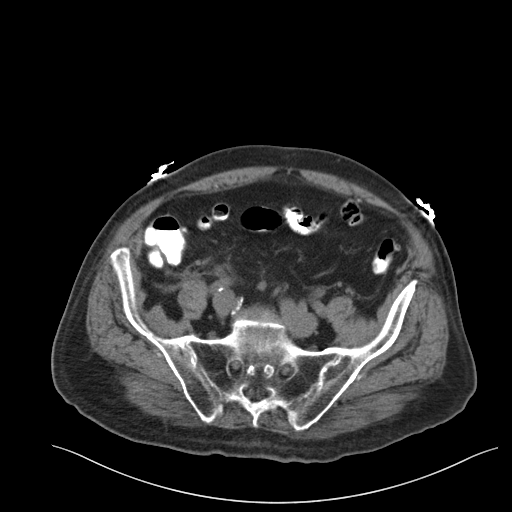
[im 39/85  soft-tissue]
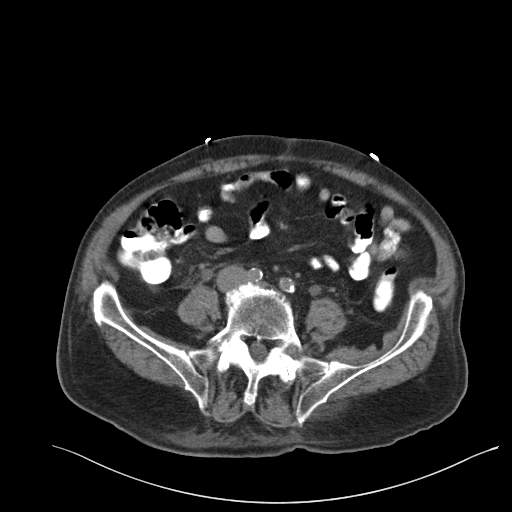
[im 46/85  soft-tissue]
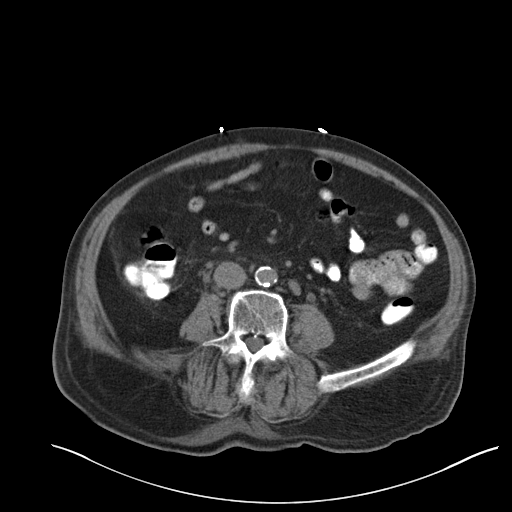
[im 53/85  soft-tissue]
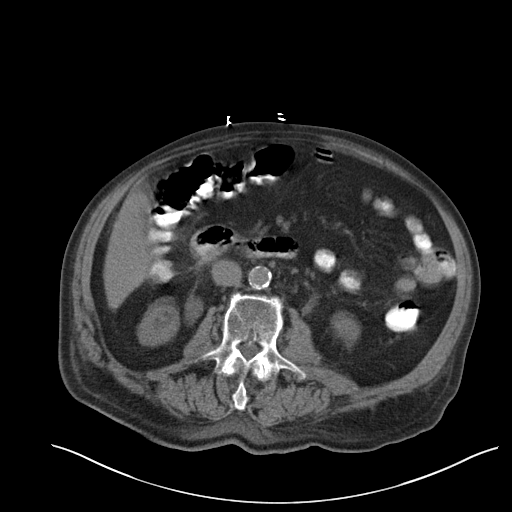
[im 60/85  soft-tissue]
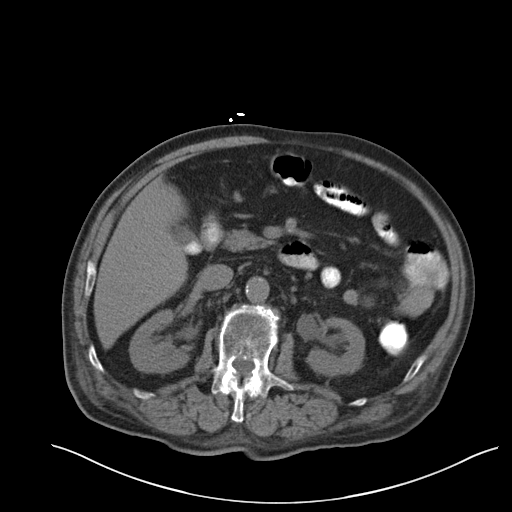
[im 60/85  bone]
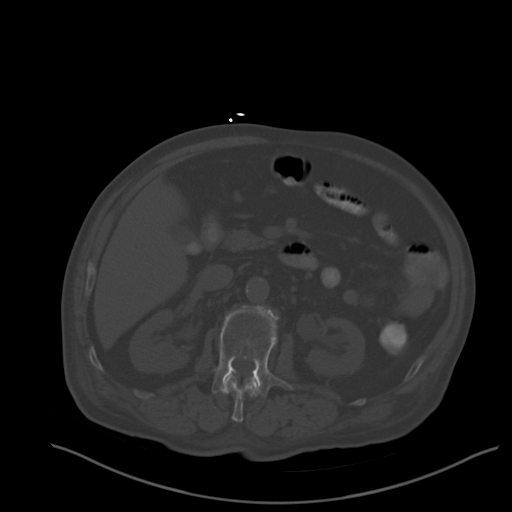
[im 67/85  soft-tissue]
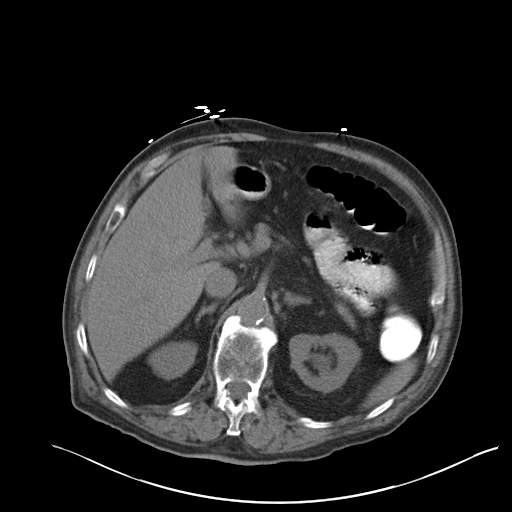
[im 71/85  lung]
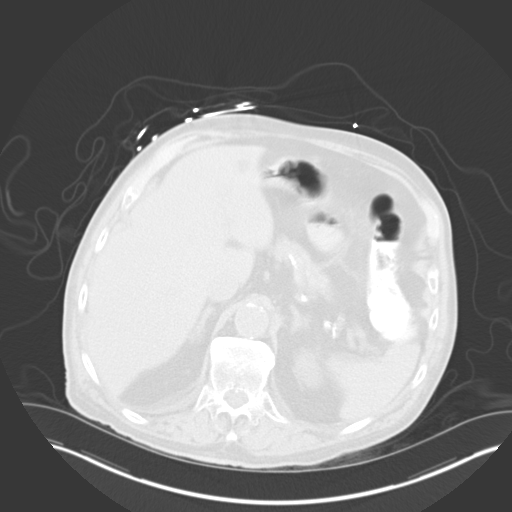
[im 74/85  soft-tissue]
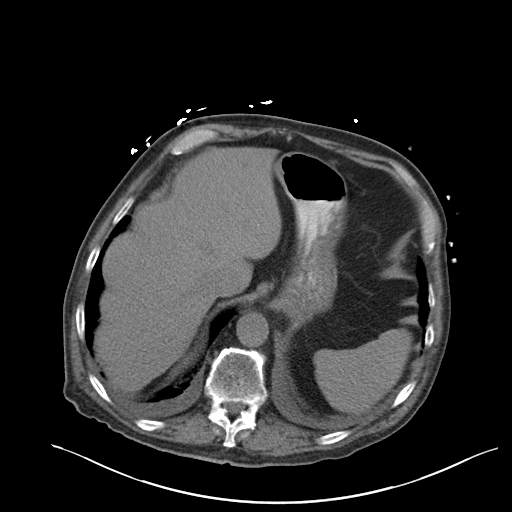
[im 74/85  lung]
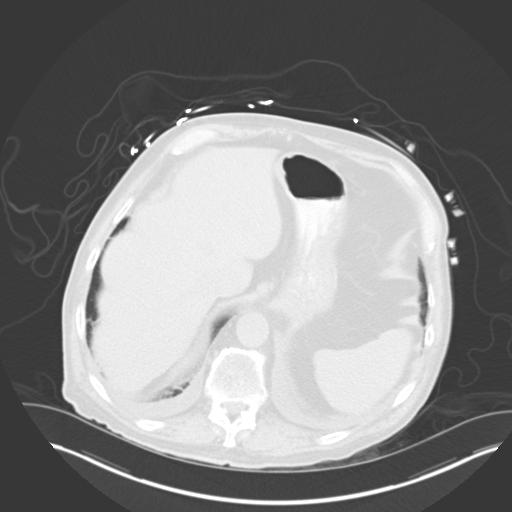
[im 78/85  lung]
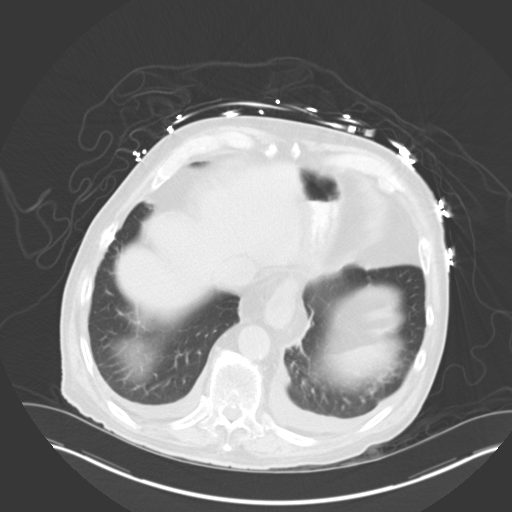
[im 81/85  soft-tissue]
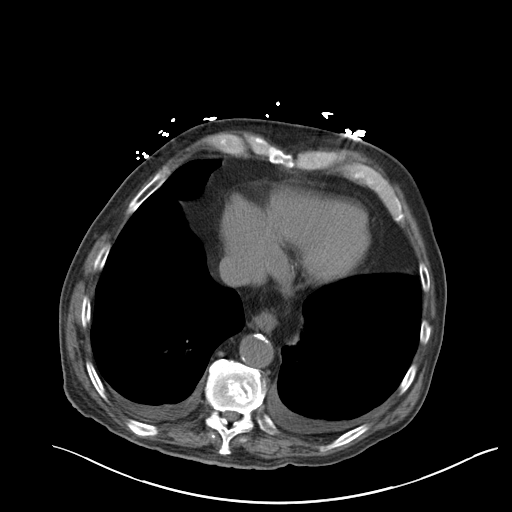
[im 81/85  lung]
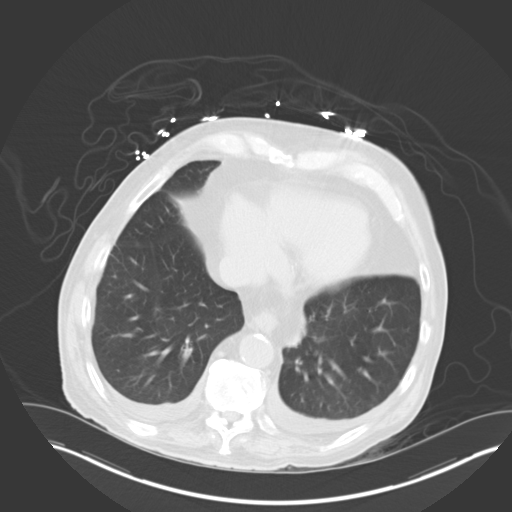

[14 of 32 positions shown; findings below may reference images not displayed]

FINDINGS: Trace pleural effusions are noted.

Unenhanced CT was performed per clinician order. Lack of IV contrast
limits sensitivity and specificity, especially for evaluation of
abdominal/pelvic solid viscera. 1 cm lateral segment left hepatic
lobe cyst is reidentified. Too small to characterize 5 mm lateral
segment left hepatic lobe hypodense lesion image 9 is stable.
Gallstones are noted within the decompressed otherwise
normal-appearing gallbladder. Motion artifact degrades [HOSPITAL]
multiple levels. Adrenal glands, spleen, and pancreas are grossly
unremarkable.

There has been interval development of mild hydroureteronephrosis to
the level of the bladder which is partly decompressed but
demonstrates partly visualized wall thickening of at least 1 cm
image 67. A Foley catheter is in place. At the right bladder apex,
there is a 0.7 cm dominant lymph node as well as other
similar-appearing enlarged supravesical lymph nodes for example
image 57. 0.8 cm periaortic node identified image 40. Pericaval
cm lymph node identified image 51, not significantly changed. The
supravesical nodes are increase in size since previously but the
dominant periaortic node is stable in size. There is trace fluid
tracking along the ureters tracking to the pelvis. Marked
enlargement of the prostate is again noted. Bilateral small inguinal
nodes are stable.

No bowel wall thickening or focal segmental dilatation. Moderate
atheromatous aortic calcification without aneurysm.

The bones are subjectively osteopenic. Multilevel disc degenerative
changes noted. No new lytic or sclerotic osseous lesion is
identified. Lower thoracic loss of vertebral body height is
reidentified.
IMPRESSION: Diffusely thick walled bladder with mild proximal bilateral
hydroureteronephrosis. Given the history of bladder cancer, this may
indicate tumor involvement although cystitis or posttreatment change
could appear similar.

Retroperitoneal and supravesical lymphadenopathy, with interval
increase in size of the supravesical nodes which could be neoplastic
or inflammatory.

New trace bilateral pleural effusions.

## 2014-03-16 ENCOUNTER — Ambulatory Visit (INDEPENDENT_AMBULATORY_CARE_PROVIDER_SITE_OTHER): Payer: Medicare Other | Admitting: Pharmacist

## 2014-03-16 DIAGNOSIS — I482 Chronic atrial fibrillation, unspecified: Secondary | ICD-10-CM

## 2014-03-16 DIAGNOSIS — Z952 Presence of prosthetic heart valve: Secondary | ICD-10-CM

## 2014-03-16 DIAGNOSIS — Z7901 Long term (current) use of anticoagulants: Secondary | ICD-10-CM | POA: Diagnosis not present

## 2014-03-16 DIAGNOSIS — Z954 Presence of other heart-valve replacement: Secondary | ICD-10-CM

## 2014-03-16 LAB — POCT INR: INR: 3.5

## 2014-04-14 ENCOUNTER — Ambulatory Visit (INDEPENDENT_AMBULATORY_CARE_PROVIDER_SITE_OTHER): Payer: Medicare Other | Admitting: *Deleted

## 2014-04-14 DIAGNOSIS — Z954 Presence of other heart-valve replacement: Secondary | ICD-10-CM

## 2014-04-14 DIAGNOSIS — I482 Chronic atrial fibrillation, unspecified: Secondary | ICD-10-CM

## 2014-04-14 DIAGNOSIS — Z7901 Long term (current) use of anticoagulants: Secondary | ICD-10-CM

## 2014-04-14 DIAGNOSIS — Z952 Presence of prosthetic heart valve: Secondary | ICD-10-CM

## 2014-04-14 LAB — POCT INR: INR: 5.5

## 2014-04-22 IMAGING — CR DG CHEST 2V
2 series · 2 of 2 positions shown · non-contrast
Comparison: Chest and abdomen radiograph 02/27/2013

CLINICAL DATA: Shortness of breath, weakness, cough

EXAM:
CHEST  2 VIEW

[w chest lat]
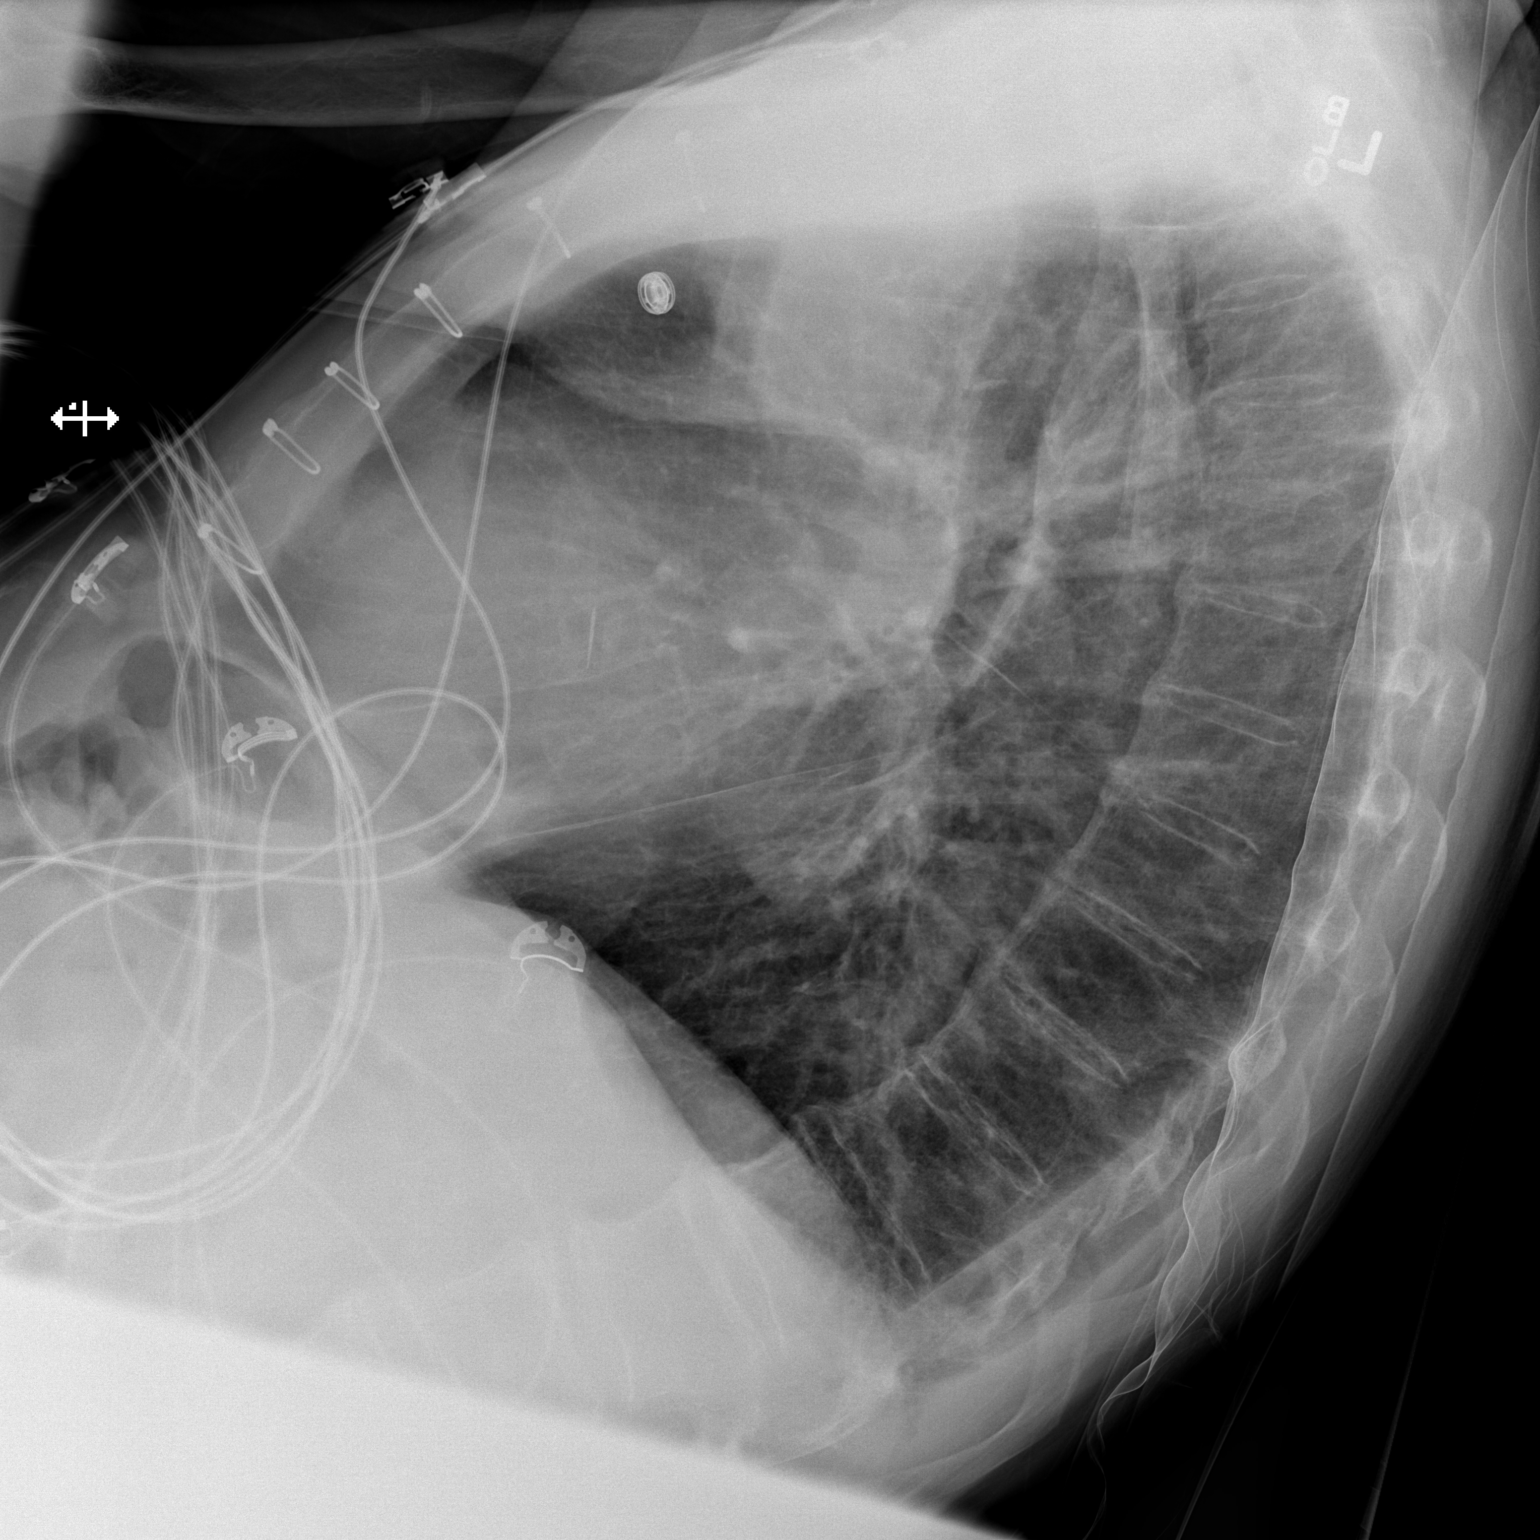

[x chest ap]
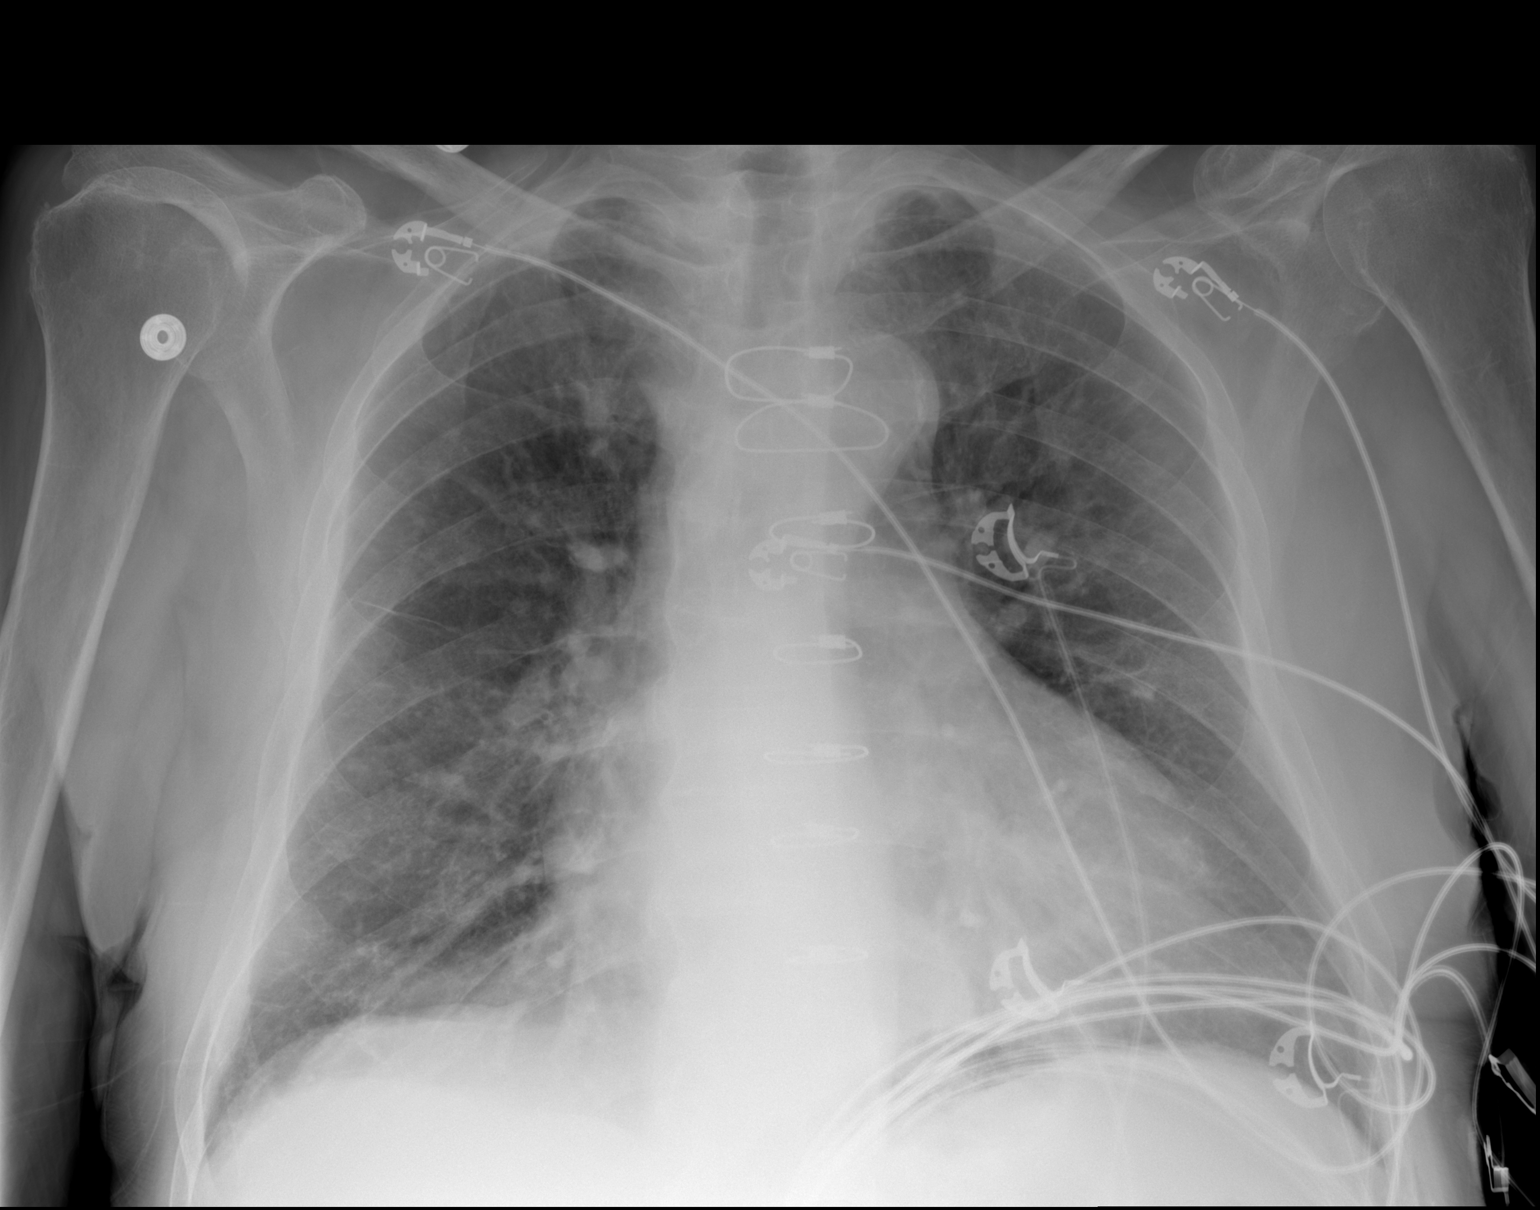

[2 of 2 positions shown; findings below may reference images not displayed]

FINDINGS: There changes of median sternotomy. Cardiomegaly is stable. Thoracic
aorta atherosclerotic calcification. Pulmonary vascularity is mildly
congested. There is slight chronic prominence of the interstitium.
No airspace disease, pleural effusion, or pneumothorax.

Small hiatal hernia.

Bones are diffusely osteopenic. No acute osseous abnormality
identified.
IMPRESSION: Cardiomegaly and mild pulmonary vascular congestion. Slight diffuse
interstitial prominence may be due to chronic interstitial
thickening, or mild interstitial pulmonary edema.

Small hiatal hernia.

## 2014-04-27 DIAGNOSIS — Z8551 Personal history of malignant neoplasm of bladder: Secondary | ICD-10-CM | POA: Diagnosis not present

## 2014-04-27 DIAGNOSIS — R339 Retention of urine, unspecified: Secondary | ICD-10-CM | POA: Diagnosis not present

## 2014-04-27 DIAGNOSIS — N403 Nodular prostate with lower urinary tract symptoms: Secondary | ICD-10-CM | POA: Diagnosis not present

## 2014-04-27 DIAGNOSIS — R312 Other microscopic hematuria: Secondary | ICD-10-CM | POA: Diagnosis not present

## 2014-04-27 DIAGNOSIS — D090 Carcinoma in situ of bladder: Secondary | ICD-10-CM | POA: Diagnosis not present

## 2014-04-27 DIAGNOSIS — C679 Malignant neoplasm of bladder, unspecified: Secondary | ICD-10-CM | POA: Diagnosis not present

## 2014-04-28 ENCOUNTER — Ambulatory Visit (INDEPENDENT_AMBULATORY_CARE_PROVIDER_SITE_OTHER): Payer: Medicare Other | Admitting: *Deleted

## 2014-04-28 DIAGNOSIS — Z954 Presence of other heart-valve replacement: Secondary | ICD-10-CM | POA: Diagnosis not present

## 2014-04-28 DIAGNOSIS — Z952 Presence of prosthetic heart valve: Secondary | ICD-10-CM

## 2014-04-28 DIAGNOSIS — I482 Chronic atrial fibrillation, unspecified: Secondary | ICD-10-CM

## 2014-04-28 DIAGNOSIS — Z7901 Long term (current) use of anticoagulants: Secondary | ICD-10-CM | POA: Diagnosis not present

## 2014-04-28 LAB — POCT INR: INR: 3.8

## 2014-04-30 ENCOUNTER — Telehealth: Payer: Self-pay | Admitting: Nurse Practitioner

## 2014-04-30 NOTE — Telephone Encounter (Signed)
Received medical records from [facility], sent to Dr. Servando Snare. 04/30/14/ss

## 2014-05-12 ENCOUNTER — Ambulatory Visit (INDEPENDENT_AMBULATORY_CARE_PROVIDER_SITE_OTHER): Payer: Medicare Other | Admitting: *Deleted

## 2014-05-12 DIAGNOSIS — Z954 Presence of other heart-valve replacement: Secondary | ICD-10-CM

## 2014-05-12 DIAGNOSIS — I482 Chronic atrial fibrillation, unspecified: Secondary | ICD-10-CM

## 2014-05-12 DIAGNOSIS — Z7901 Long term (current) use of anticoagulants: Secondary | ICD-10-CM

## 2014-05-12 DIAGNOSIS — Z952 Presence of prosthetic heart valve: Secondary | ICD-10-CM

## 2014-05-12 LAB — POCT INR: INR: 2.5

## 2014-05-20 ENCOUNTER — Other Ambulatory Visit: Payer: Self-pay | Admitting: *Deleted

## 2014-05-20 ENCOUNTER — Ambulatory Visit (INDEPENDENT_AMBULATORY_CARE_PROVIDER_SITE_OTHER): Payer: Medicare Other | Admitting: Nurse Practitioner

## 2014-05-20 ENCOUNTER — Encounter: Payer: Self-pay | Admitting: Nurse Practitioner

## 2014-05-20 VITALS — BP 102/46 | HR 50 | Ht 69.0 in | Wt 159.4 lb

## 2014-05-20 DIAGNOSIS — I5022 Chronic systolic (congestive) heart failure: Secondary | ICD-10-CM | POA: Diagnosis not present

## 2014-05-20 DIAGNOSIS — Z954 Presence of other heart-valve replacement: Secondary | ICD-10-CM

## 2014-05-20 DIAGNOSIS — Z952 Presence of prosthetic heart valve: Secondary | ICD-10-CM

## 2014-05-20 DIAGNOSIS — I482 Chronic atrial fibrillation, unspecified: Secondary | ICD-10-CM

## 2014-05-20 DIAGNOSIS — E0590 Thyrotoxicosis, unspecified without thyrotoxic crisis or storm: Secondary | ICD-10-CM

## 2014-05-20 LAB — CBC
HCT: 36.2 % — ABNORMAL LOW (ref 39.0–52.0)
Hemoglobin: 11.9 g/dL — ABNORMAL LOW (ref 13.0–17.0)
MCHC: 33 g/dL (ref 30.0–36.0)
MCV: 89.4 fl (ref 78.0–100.0)
Platelets: 160 10*3/uL (ref 150.0–400.0)
RBC: 4.05 Mil/uL — ABNORMAL LOW (ref 4.22–5.81)
RDW: 16.1 % — ABNORMAL HIGH (ref 11.5–15.5)
WBC: 6.7 10*3/uL (ref 4.0–10.5)

## 2014-05-20 LAB — BASIC METABOLIC PANEL
BUN: 35 mg/dL — ABNORMAL HIGH (ref 6–23)
CO2: 29 mEq/L (ref 19–32)
Calcium: 9 mg/dL (ref 8.4–10.5)
Chloride: 106 mEq/L (ref 96–112)
Creatinine, Ser: 1.27 mg/dL (ref 0.40–1.50)
GFR: 56.3 mL/min — ABNORMAL LOW (ref >60.00)
Glucose, Bld: 94 mg/dL (ref 70–99)
Potassium: 4.8 mEq/L (ref 3.5–5.1)
Sodium: 139 mEq/L (ref 135–145)

## 2014-05-20 LAB — TSH: TSH: 5.12 u[IU]/mL — ABNORMAL HIGH (ref 0.35–4.50)

## 2014-05-20 NOTE — Patient Instructions (Signed)
We will be checking the following labs today - BMET, CBC, TSH and HPF   Medication Instructions:    Continue with your current medicines.     Testing/Procedures To Be Arranged:  N/A  Follow-Up:   I will see you back in 6 months    Other Special Instructions:   N/A  Call the [place] office at [phone] if you have any questions, problems or concerns.

## 2014-05-20 NOTE — Progress Notes (Signed)
CARDIOLOGY OFFICE NOTE  Date:  05/20/2014    Eric Wheeler Date of Birth: Jul 09, 1919 Medical Record #[id number]  PCP:  [name], NP  Cardiologist:  Hochrein/Geraldyne Barraclough    Chief Complaint  Patient presents with  . Atrial Fibrillation    Follow up visit - seen for Dr. Percival Spanish     History of Present Illness: Eric Wheeler is a 81 y.o. male who presents today for a follow up visit. 6 month check. Seen for Dr. Percival Spanish - who has not yet met - former patient of Dr. Susa Simmonds. He is 81 years of age. He has chronic systolic HF with EF of 45 to 50% (per echo 02/2013) with prior AVR in 1987, HTN and in chronic atrial fib - managed with rate control and coumadin. He is intolerant to ACE due to dizziness.   He had surgery last year in 2015 for a large bladder tumor - complicated by hematuria, obstruction, renal failure, AF with RVR - he has been back to the hospital several times. He has opted for conservative management with no plans for further treatment/chemo.  I last saw him in November - he was doing ok.  Comes back today. Here with son-in-law. Continues to do ok. Has good days and bad days. Tires easily. Has been on his tractor. Foley is out. No chest pain. Breathing is "ok". No swelling. Has lost more weight. Recent visit with GU that was ok. Seems to be holding his own. Does sleep more during the day.   Past Medical History  Diagnosis Date  . Chronic atrial fibrillation   . Chronic anticoagulation     a. afib/st. jude avr - goal 2.5-3.5   . BPH (benign prostatic hypertrophy)   . Hypertension   . History of kidney stones   . Gout   . Syncope   . History of skin cancer   . Bladder tumor     a. s/p resection 02/19/2013.  [name] Hx of scarlet fever   . Seizures 2014    evaluated by Dr. Jannifer Franklin at [facility] neurology -  no cause determined - refered back to cardiologist - recommended loop recorder to monitor rythm but pt refused - continues to have seizures per family -  notes in EPIC  . Chronic combined systolic and diastolic CHF (congestive heart failure)     a. EF 45 to 50% per echo in 2011 following exacerbation  . Chronic kidney disease   . Cancer     carcinoma in situ of bladder   . Disorder of fluid or electrolyte   . Elevated PSA   . Hematuria   . Penile bleeding   . Urethral trauma   . Urinary retention   . Arthritis   . Gout   . GERD (gastroesophageal reflux disease)   . Kidney stone   . Heart murmur   . Pyuria   . Skin cancer     Past Surgical History  Procedure Laterality Date  . Aortic valve replacement  1987    #21 MM ST JUDE PLACED BY DR.CHARLES WILSON  . Cataract extraction    . Transurethral resection of prostate N/A 02/19/2013    Procedure: TRANSURETHRAL RESECTION OF THE PROSTATE /bladder WITH GYRUS INSTRUMENTS;  Surgeon: Irine Seal, MD;  Location: [facility];  Service: Urology;  Laterality: N/A;  . Cystoscopy N/A 02/19/2013    Procedure: CYSTOSCOPY;  Surgeon: Irine Seal, MD;  Location: [facility];  Service: Urology;  Laterality: N/A;  . Skin biopsy  Medications: Current Outpatient Prescriptions  Medication Sig Dispense Refill  . carvedilol (COREG) 6.25 MG tablet Take 1 tablet (6.25 mg total) by mouth 2 (two) times daily with a meal. 60 tablet 11  . diltiazem (DILACOR XR) 240 MG 24 hr capsule Take 1 capsule (240 mg total) by mouth daily. 30 capsule 11  . feeding supplement, ENSURE COMPLETE, (ENSURE COMPLETE) LIQD Take 237 mLs by mouth 2 (two) times daily between meals. 10 Bottle 0  . finasteride (PROSCAR) 5 MG tablet Take 5 mg by mouth every morning.     . simvastatin (ZOCOR) 10 MG tablet Take 1 tablet (10 mg total) by mouth at bedtime. 30 tablet 11  . terazosin (HYTRIN) 5 MG capsule Take 5 mg by mouth at bedtime.      [name] warfarin (COUMADIN) 5 MG tablet Take as directed by coumadin clinic 90 tablet 0   No current facility-administered medications for this visit.    Allergies: Allergies  Allergen Reactions  . Ace  Inhibitors Other (See Comments)    Dizziness   . Codeine Nausea And Vomiting  . Sulfa Drugs Cross Reactors Nausea And Vomiting    Social History: The patient  reports that he quit smoking about 43 years ago. He has never used smokeless tobacco. He reports that he does not drink alcohol or use illicit drugs.   Family History: The patient's family history includes Asthma in his father; Cancer in his mother; Dementia in his sister; Heart disease in his brother and father; Stroke in his father.   Review of Systems: Please see the history of present illness.   Otherwise, the review of systems is positive for chronic DOE - no worse.   All other systems are reviewed and negative.   Physical Exam: VS:  BP 102/46 mmHg  Pulse 50  Ht $R'5\' 9"'DO$  (1.753 m)  Wt 159 lb 6.4 oz (72.303 kg)  BMI 23.53 kg/m2  SpO2 99% .  BMI Body mass index is 23.53 kg/(m^2).  Wt Readings from Last 3 Encounters:  05/20/14 159 lb 6.4 oz (72.303 kg)  11/19/13 168 lb (76.204 kg)  07/21/13 170 lb (77.111 kg)   Repeat BY by me is 120/70  General: Pleasant. Elderly male but in no acute distress. His weight is down 9 pounds. HEENT: Normal. Neck: Supple, no JVD, carotid bruits, or masses noted.  Cardiac: Irregular irregular rhythm but with good rate control. No edema.  Respiratory:  Lungs are clear to auscultation bilaterally with normal work of breathing.  GI: Soft and nontender.  MS: No deformity or atrophy. Gait and ROM intact. Skin: Warm and dry. Color is normal.  Neuro:  Strength and sensation are intact and no gross focal deficits noted.  Psych: Alert, appropriate and with normal affect.   LABORATORY DATA:  EKG:  EKG is not ordered today.  Lab Results  Component Value Date   WBC 7.0 11/19/2013   HGB 11.4* 11/19/2013   HCT 35.7* 11/19/2013   PLT 129.0* 11/19/2013   GLUCOSE 110* 11/19/2013   CHOL 135 07/21/2013   TRIG 84.0 07/21/2013   HDL 37.60* 07/21/2013   LDLCALC 81 07/21/2013   ALT 7 07/21/2013    AST 17 07/21/2013   NA 140 11/19/2013   K 4.6 11/19/2013   CL 107 11/19/2013   CREATININE 1.3 11/19/2013   BUN 37* 11/19/2013   CO2 32 11/19/2013   TSH 2.843 02/27/2013   INR 2.5 05/12/2014   Lab Results  Component Value Date   INR 2.5 05/12/2014  INR 3.8 04/28/2014   INR 5.5 04/14/2014    BNP (last 3 results) No results for input(s): BNP in the last 8760 hours.  ProBNP (last 3 results) No results for input(s): PROBNP in the last 8760 hours.   Other Studies Reviewed Today:  Echo Study Conclusions from February 2015  - Left ventricle: The cavity size was normal. Wall thickness was normal. Systolic function was mildly reduced. The estimated ejection fraction was in the range of 45% to 50%. Diffuse hypokinesis. Indeterminant diastolic function (atrial fibrillation). - Aortic valve: St Jude mechanical aortic valve. No significant prosthetic valve regurgitation or stenosis. Mean gradient: 16mm Hg (S). - Mitral valve: Moderately calcified annulus. Moderately calcified leaflets . Mild to moderate regurgitation. - Left atrium: The atrium was mildly to moderately dilated. - Right ventricle: The cavity size was mildly dilated. Systolic function was normal. - Right atrium: The atrium was moderately dilated. - Tricuspid valve: Peak RV-RA gradient: 1mm Hg (S). Maximal regurgitant velocity: 292cm/s. - Pulmonary arteries: PA peak pressure: 58mm Hg (S). - Systemic veins: IVC measured 2.3 cm with < 50% respirophasic variation, suggesting RA pressure 15 mmHg. Impressions:  - The patient was in atrial fibrillation. Normal LV size with EF 45-50%, mild diffuse hypokinesis. Mild to moderate MR. St Jude mechanical aortic valve appeared to function normally. Mildly dilated RV with normal systolic function. Moderate pulmonary hypertension.  Assessment / Plan:  1. Chronic atrial fib - rate is controlled and he is anticoagulated - he is asymptomatic. I have left him on his current  regimen.   2. Prior resection of large bladder tumor - complicated by renal failure, UTI, urinary retention - sees Dr. [name] - no further therapy for his bladder cancer- Not felt to have reserve for continued complications. His foley is out and he is able to void now.   3. Prior AVR   4. Systolic HF - has lasix/potassium on hand to just use prn and has not had to use in quite sometime. Volume status seems stable  5. Advanced age - Does seem to be getting a little more feeble - but stable overall.  6 Weight loss - continues to lose weight. Recheck some labs today.   Current medicines are reviewed with the patient today.  The patient does not have concerns regarding medicines other than what has been noted above.  The following changes have been made:  See above.  Labs/ tests ordered today include:    Orders Placed This Encounter  Procedures  . Basic metabolic panel  . CBC  . TSH     Disposition:   FU with me in 6 months.   Patient is agreeable to this plan and will call if any problems develop in the interim.   Signed: Burtis Junes, RN, ANP-C 05/20/2014 10:16 AM  [facility] [address] [place], [place]  [address] Phone: [phone] Fax: [phone]

## 2014-05-26 ENCOUNTER — Other Ambulatory Visit: Payer: Self-pay | Admitting: *Deleted

## 2014-05-26 ENCOUNTER — Other Ambulatory Visit (INDEPENDENT_AMBULATORY_CARE_PROVIDER_SITE_OTHER): Payer: Medicare Other

## 2014-05-26 ENCOUNTER — Ambulatory Visit (INDEPENDENT_AMBULATORY_CARE_PROVIDER_SITE_OTHER): Payer: Medicare Other | Admitting: Pharmacist Clinician (PhC)/ Clinical Pharmacy Specialist

## 2014-05-26 DIAGNOSIS — Z7901 Long term (current) use of anticoagulants: Secondary | ICD-10-CM | POA: Diagnosis not present

## 2014-05-26 DIAGNOSIS — I482 Chronic atrial fibrillation, unspecified: Secondary | ICD-10-CM

## 2014-05-26 DIAGNOSIS — E0590 Thyrotoxicosis, unspecified without thyrotoxic crisis or storm: Secondary | ICD-10-CM

## 2014-05-26 DIAGNOSIS — Z954 Presence of other heart-valve replacement: Secondary | ICD-10-CM

## 2014-05-26 DIAGNOSIS — Z952 Presence of prosthetic heart valve: Secondary | ICD-10-CM

## 2014-05-26 LAB — TSH: TSH: 4.51 u[IU]/mL — ABNORMAL HIGH (ref 0.35–4.50)

## 2014-05-26 LAB — POCT INR: INR: 4.2

## 2014-06-09 ENCOUNTER — Ambulatory Visit (INDEPENDENT_AMBULATORY_CARE_PROVIDER_SITE_OTHER): Payer: Medicare Other

## 2014-06-09 DIAGNOSIS — Z954 Presence of other heart-valve replacement: Secondary | ICD-10-CM | POA: Diagnosis not present

## 2014-06-09 DIAGNOSIS — I482 Chronic atrial fibrillation, unspecified: Secondary | ICD-10-CM

## 2014-06-09 DIAGNOSIS — Z7901 Long term (current) use of anticoagulants: Secondary | ICD-10-CM

## 2014-06-09 DIAGNOSIS — Z952 Presence of prosthetic heart valve: Secondary | ICD-10-CM

## 2014-06-09 LAB — POCT INR: INR: 2.5

## 2014-06-16 ENCOUNTER — Other Ambulatory Visit (INDEPENDENT_AMBULATORY_CARE_PROVIDER_SITE_OTHER): Payer: Medicare Other | Admitting: *Deleted

## 2014-06-16 DIAGNOSIS — E0590 Thyrotoxicosis, unspecified without thyrotoxic crisis or storm: Secondary | ICD-10-CM

## 2014-06-16 LAB — TSH: TSH: 2.77 u[IU]/mL (ref 0.35–4.50)

## 2014-06-30 ENCOUNTER — Ambulatory Visit (INDEPENDENT_AMBULATORY_CARE_PROVIDER_SITE_OTHER): Payer: Medicare Other

## 2014-06-30 DIAGNOSIS — Z952 Presence of prosthetic heart valve: Secondary | ICD-10-CM

## 2014-06-30 DIAGNOSIS — I482 Chronic atrial fibrillation, unspecified: Secondary | ICD-10-CM

## 2014-06-30 DIAGNOSIS — Z7901 Long term (current) use of anticoagulants: Secondary | ICD-10-CM

## 2014-06-30 DIAGNOSIS — Z954 Presence of other heart-valve replacement: Secondary | ICD-10-CM

## 2014-06-30 LAB — POCT INR: INR: 3.4

## 2014-07-06 ENCOUNTER — Other Ambulatory Visit: Payer: Self-pay

## 2014-07-14 DIAGNOSIS — R312 Other microscopic hematuria: Secondary | ICD-10-CM | POA: Diagnosis not present

## 2014-07-14 DIAGNOSIS — N390 Urinary tract infection, site not specified: Secondary | ICD-10-CM | POA: Diagnosis not present

## 2014-07-21 ENCOUNTER — Ambulatory Visit (INDEPENDENT_AMBULATORY_CARE_PROVIDER_SITE_OTHER): Payer: Medicare Other

## 2014-07-21 DIAGNOSIS — Z952 Presence of prosthetic heart valve: Secondary | ICD-10-CM

## 2014-07-21 DIAGNOSIS — I482 Chronic atrial fibrillation, unspecified: Secondary | ICD-10-CM

## 2014-07-21 DIAGNOSIS — Z954 Presence of other heart-valve replacement: Secondary | ICD-10-CM

## 2014-07-21 DIAGNOSIS — Z7901 Long term (current) use of anticoagulants: Secondary | ICD-10-CM | POA: Diagnosis not present

## 2014-07-21 LAB — POCT INR: INR: 2.2

## 2014-07-22 ENCOUNTER — Other Ambulatory Visit: Payer: Self-pay | Admitting: Cardiology

## 2014-08-08 ENCOUNTER — Encounter (HOSPITAL_COMMUNITY): Payer: Self-pay

## 2014-08-08 ENCOUNTER — Emergency Department (HOSPITAL_COMMUNITY)
Admission: EM | Admit: 2014-08-08 | Discharge: 2014-08-08 | Disposition: A | Discharge status: Home / Self Care | Payer: Medicare Other | Attending: Emergency Medicine | Admitting: Emergency Medicine

## 2014-08-08 DIAGNOSIS — Z87891 Personal history of nicotine dependence: Secondary | ICD-10-CM | POA: Insufficient documentation

## 2014-08-08 DIAGNOSIS — N400 Enlarged prostate without lower urinary tract symptoms: Secondary | ICD-10-CM | POA: Diagnosis not present

## 2014-08-08 DIAGNOSIS — Z8719 Personal history of other diseases of the digestive system: Secondary | ICD-10-CM | POA: Insufficient documentation

## 2014-08-08 DIAGNOSIS — Z8551 Personal history of malignant neoplasm of bladder: Secondary | ICD-10-CM | POA: Insufficient documentation

## 2014-08-08 DIAGNOSIS — R011 Cardiac murmur, unspecified: Secondary | ICD-10-CM | POA: Insufficient documentation

## 2014-08-08 DIAGNOSIS — Z7901 Long term (current) use of anticoagulants: Secondary | ICD-10-CM | POA: Diagnosis not present

## 2014-08-08 DIAGNOSIS — I129 Hypertensive chronic kidney disease with stage 1 through stage 4 chronic kidney disease, or unspecified chronic kidney disease: Secondary | ICD-10-CM | POA: Diagnosis not present

## 2014-08-08 DIAGNOSIS — Z87442 Personal history of urinary calculi: Secondary | ICD-10-CM | POA: Insufficient documentation

## 2014-08-08 DIAGNOSIS — I5042 Chronic combined systolic (congestive) and diastolic (congestive) heart failure: Secondary | ICD-10-CM | POA: Diagnosis not present

## 2014-08-08 DIAGNOSIS — Z8619 Personal history of other infectious and parasitic diseases: Secondary | ICD-10-CM | POA: Diagnosis not present

## 2014-08-08 DIAGNOSIS — Z8639 Personal history of other endocrine, nutritional and metabolic disease: Secondary | ICD-10-CM | POA: Insufficient documentation

## 2014-08-08 DIAGNOSIS — Z85828 Personal history of other malignant neoplasm of skin: Secondary | ICD-10-CM | POA: Diagnosis not present

## 2014-08-08 DIAGNOSIS — R319 Hematuria, unspecified: Secondary | ICD-10-CM

## 2014-08-08 DIAGNOSIS — I482 Chronic atrial fibrillation: Secondary | ICD-10-CM | POA: Diagnosis not present

## 2014-08-08 DIAGNOSIS — N189 Chronic kidney disease, unspecified: Secondary | ICD-10-CM | POA: Insufficient documentation

## 2014-08-08 DIAGNOSIS — Z8739 Personal history of other diseases of the musculoskeletal system and connective tissue: Secondary | ICD-10-CM | POA: Diagnosis not present

## 2014-08-08 LAB — BASIC METABOLIC PANEL
ANION GAP: 8 (ref 5–15)
BUN: 40 mg/dL — AB (ref 6–20)
CO2: 24 mmol/L (ref 22–32)
Calcium: 8.6 mg/dL — ABNORMAL LOW (ref 8.9–10.3)
Chloride: 105 mmol/L (ref 101–111)
Creatinine, Ser: 1.47 mg/dL — ABNORMAL HIGH (ref 0.61–1.24)
GFR calc Af Amer: 46 mL/min — ABNORMAL LOW (ref >60)
GFR calc non Af Amer: 40 mL/min — ABNORMAL LOW (ref >60)
Glucose, Bld: 109 mg/dL — ABNORMAL HIGH (ref 65–99)
POTASSIUM: 5.1 mmol/L (ref 3.5–5.1)
Sodium: 137 mmol/L (ref 135–145)

## 2014-08-08 LAB — CBC WITH DIFFERENTIAL/PLATELET
BASOS PCT: 0 % (ref 0–1)
Basophils Absolute: 0 10*3/uL (ref 0.0–0.1)
EOS ABS: 0.4 10*3/uL (ref 0.0–0.7)
EOS PCT: 5 % (ref 0–5)
HEMATOCRIT: 38.3 % — AB (ref 39.0–52.0)
Hemoglobin: 11.9 g/dL — ABNORMAL LOW (ref 13.0–17.0)
LYMPHS PCT: 12 % (ref 12–46)
Lymphs Abs: 0.9 10*3/uL (ref 0.7–4.0)
MCH: 29.5 pg (ref 26.0–34.0)
MCHC: 31.1 g/dL (ref 30.0–36.0)
MCV: 94.8 fL (ref 78.0–100.0)
MONO ABS: 1 10*3/uL (ref 0.1–1.0)
Monocytes Relative: 13 % — ABNORMAL HIGH (ref 3–12)
Neutro Abs: 5.6 10*3/uL (ref 1.7–7.7)
Neutrophils Relative %: 70 % (ref 43–77)
Platelets: 155 10*3/uL (ref 150–400)
RBC: 4.04 MIL/uL — ABNORMAL LOW (ref 4.22–5.81)
RDW: 14.2 % (ref 11.5–15.5)
WBC: 7.9 10*3/uL (ref 4.0–10.5)

## 2014-08-08 LAB — PROTIME-INR
INR: 2.68 — ABNORMAL HIGH (ref 0.00–1.49)
PROTHROMBIN TIME: 28.1 s — AB (ref 11.6–15.2)

## 2014-08-08 LAB — URINALYSIS, ROUTINE W REFLEX MICROSCOPIC
Glucose, UA: NEGATIVE mg/dL
KETONES UR: NEGATIVE mg/dL
NITRITE: NEGATIVE
PH: 5 (ref 5.0–8.0)
Protein, ur: >300 mg/dL — AB
SPECIFIC GRAVITY, URINE: 1.016 (ref 1.005–1.030)
Urobilinogen, UA: 0.2 mg/dL (ref 0.0–1.0)

## 2014-08-08 LAB — URINE MICROSCOPIC-ADD ON

## 2014-08-08 MED ORDER — LIDOCAINE HCL 2 % EX GEL
CUTANEOUS | Status: AC
  Administered 2014-08-08: 1
  Filled 2014-08-08: qty 10

## 2014-08-08 MED ORDER — SODIUM CHLORIDE 0.9 % IV BOLUS (SEPSIS)
500.0000 mL | Freq: Once | INTRAVENOUS | Status: AC
  Administered 2014-08-08: 500 mL via INTRAVENOUS

## 2014-08-08 NOTE — ED Provider Notes (Signed)
CSN: [id number]     Arrival date & time 08/08/14  1645 History   First MD Initiated Contact with Patient 08/08/14 1723     Chief Complaint  Patient presents with  . Hematuria     (Consider location/radiation/quality/duration/timing/severity/associated sxs/prior Treatment) HPI Comments: 81 year old male with extensive past medical history including diastolic CHF, atrial fibrillation on Coumadin, and bladder cancer status post resection who presents with hematuria. Patient states that approximately 3 and half weeks ago, he went to his PCP for urinary frequency and dysuria. He was diagnosed with a UTI and given an antibiotic course which he finished. He states that his symptoms improved but this morning he woke up and began having gross hematuria. He denies any pain at this time. Specifically, no abdominal pain, back pain, burning with urination. No fevers or recent illness. His last INR check was normal.  Patient is a 81 y.o. male presenting with hematuria. The history is provided by the patient and a relative.  Hematuria    Past Medical History  Diagnosis Date  . Chronic atrial fibrillation   . Chronic anticoagulation     a. afib/st. jude avr - goal 2.5-3.5   . BPH (benign prostatic hypertrophy)   . Hypertension   . History of kidney stones   . Gout   . Syncope   . History of skin cancer   . Bladder tumor     a. s/p resection 02/19/2013.  [name] Hx of scarlet fever   . Seizures 2014    evaluated by Dr. Jannifer Franklin at [facility] neurology -  no cause determined - refered back to cardiologist - recommended loop recorder to monitor rythm but pt refused - continues to have seizures per family - notes in EPIC  . Chronic combined systolic and diastolic CHF (congestive heart failure)     a. EF 45 to 50% per echo in 2011 following exacerbation  . Chronic kidney disease   . Cancer     carcinoma in situ of bladder   . Disorder of fluid or electrolyte   . Elevated PSA   . Hematuria   . Penile  bleeding   . Urethral trauma   . Urinary retention   . Arthritis   . Gout   . GERD (gastroesophageal reflux disease)   . Kidney stone   . Heart murmur   . Pyuria   . Skin cancer    Past Surgical History  Procedure Laterality Date  . Aortic valve replacement  1987    #21 MM ST JUDE PLACED BY DR.CHARLES WILSON  . Cataract extraction    . Transurethral resection of prostate N/A 02/19/2013    Procedure: TRANSURETHRAL RESECTION OF THE PROSTATE /bladder WITH GYRUS INSTRUMENTS;  Surgeon: Irine Seal, MD;  Location: [facility];  Service: Urology;  Laterality: N/A;  . Cystoscopy N/A 02/19/2013    Procedure: CYSTOSCOPY;  Surgeon: Irine Seal, MD;  Location: [facility];  Service: Urology;  Laterality: N/A;  . Skin biopsy     Family History  Problem Relation Age of Onset  . Cancer Mother   . Heart disease Father   . Asthma Father   . Stroke Father   . Dementia Sister   . Heart disease Brother    History  Substance Use Topics  . Smoking status: Former Smoker    Quit date: 01/10/1971  . Smokeless tobacco: Never Used  . Alcohol Use: No    Review of Systems  Genitourinary: Positive for hematuria.   10 Systems reviewed and are  negative for acute change except as noted in the HPI.    Allergies  Ace inhibitors; Codeine; and Sulfa drugs cross reactors  Home Medications   Prior to Admission medications   Medication Sig Start Date End Date Taking? Authorizing Provider  carvedilol (COREG) 6.25 MG tablet Take 1 tablet (6.25 mg total) by mouth 2 (two) times daily with a meal. 11/19/13  Yes Burtis Junes, NP  diltiazem (DILACOR XR) 240 MG 24 hr capsule Take 1 capsule (240 mg total) by mouth daily. 11/19/13  Yes Burtis Junes, NP  feeding supplement, ENSURE COMPLETE, (ENSURE COMPLETE) LIQD Take 237 mLs by mouth 2 (two) times daily between meals. Patient taking differently: Take 237 mLs by mouth daily as needed (nutrtional supplement).  03/15/13  Yes Robbie Lis, MD  finasteride (PROSCAR) 5 MG  tablet Take 5 mg by mouth every morning.    Yes Historical Provider, MD  simvastatin (ZOCOR) 10 MG tablet Take 1 tablet (10 mg total) by mouth at bedtime. Patient taking differently: Take 10 mg by mouth daily.  11/19/13  Yes Burtis Junes, NP  terazosin (HYTRIN) 5 MG capsule Take 5 mg by mouth daily.    Yes Historical Provider, MD  warfarin (COUMADIN) 5 MG tablet TAKE AS DIRECTED BY COUMADIN CLINIC Patient taking differently: TAKE 5 MG BY MOUTH ON SUNDAY & THURSDAY. TAKE 2.5 MG ON ALL OTHER DAYS. 07/23/14  Yes Minus Breeding, MD   BP 136/85 mmHg  Pulse 85  Temp(Src) 97.8 F (36.6 C) (Oral)  Resp 14  SpO2 97% Physical Exam  Constitutional: He is oriented to person, place, and time. He appears well-developed and well-nourished. No distress.  HENT:  Head: Normocephalic and atraumatic.  Moist mucous membranes  Eyes: Conjunctivae are normal. Pupils are equal, round, and reactive to light.  Neck: Neck supple.  Cardiovascular: Normal rate, regular rhythm and normal heart sounds.   Aortic click  Pulmonary/Chest: Effort normal and breath sounds normal.  Abdominal: Soft. Bowel sounds are normal. He exhibits no distension. There is no tenderness.  Musculoskeletal: He exhibits no edema.  Neurological: He is alert and oriented to person, place, and time.  Fluent speech, normal gait  Skin: Skin is warm and dry.  Psychiatric: He has a normal mood and affect. Judgment normal.  Nursing note and vitals reviewed.   ED Course  Procedures (including critical care time) Labs Review Labs Reviewed  URINALYSIS, ROUTINE W REFLEX MICROSCOPIC (NOT AT [facility]) - Abnormal; Notable for the following:    Color, Urine RED (*)    APPearance TURBID (*)    Hgb urine dipstick LARGE (*)    Bilirubin Urine MODERATE (*)    Protein, ur >300 (*)    Leukocytes, UA MODERATE (*)    All other components within normal limits  BASIC METABOLIC PANEL - Abnormal; Notable for the following:    Glucose, Bld 109 (*)    BUN 40  (*)    Creatinine, Ser 1.47 (*)    Calcium 8.6 (*)    GFR calc non Af Amer 40 (*)    GFR calc Af Amer 46 (*)    All other components within normal limits  CBC WITH DIFFERENTIAL/PLATELET - Abnormal; Notable for the following:    RBC 4.04 (*)    Hemoglobin 11.9 (*)    HCT 38.3 (*)    Monocytes Relative 13 (*)    All other components within normal limits  PROTIME-INR - Abnormal; Notable for the following:    Prothrombin Time  28.1 (*)    INR 2.68 (*)    All other components within normal limits  URINE MICROSCOPIC-ADD ON - Abnormal; Notable for the following:    Squamous Epithelial / LPF FEW (*)    Bacteria, UA MANY (*)    All other components within normal limits    Imaging Review No results found.   EKG Interpretation None      MDM   Final diagnoses:  Hematuria   81 year old male with past medical history including Coumadin use and bladder cancer not currently on therapy presents with gross hematuria. Patient well-appearing with normal vital signs. No abdominal pain. Obtained labs and placed Foley catheter for bladder irrigation.  Labs show a therapeutic INR and stable blood counts. After continuous bladder irrigation for 1-2 hours, the patient's urine cleared from dark red to clear with pink tinge. No evidence of ongoing bleeding. I spoke with the urologist on call, Dr. Gaynelle Arabian, regarding patient's presentation and work up. He recommended leaving foley in place until appt w/ Dr. [name] in 3 days. Provided patient with leg bag and instructions on use. Reviewed return precautions including heavy bleeding with passage of clots, abdominal pain, fever, or urinary retention. Family voiced understanding and patient was discharged in satisfactory condition.  Sharlett Iles, MD 08/08/14 2154

## 2014-08-08 NOTE — ED Notes (Signed)
He c/o gross hematuria which began today.  He cites being on Coumadin for a-fib. And also he has bladder cancer "which we aren't treating anymore".  He is comfortable and in no distress.  He has brought a plastic container in a clear bag which he states is urine and it is in appearance as venous blood.

## 2014-08-08 NOTE — ED Notes (Signed)
Bladder scan complete, 117 ml found.

## 2014-08-08 NOTE — Discharge Instructions (Signed)

## 2014-08-12 DIAGNOSIS — D090 Carcinoma in situ of bladder: Secondary | ICD-10-CM | POA: Diagnosis not present

## 2014-08-12 DIAGNOSIS — R310 Gross hematuria: Secondary | ICD-10-CM | POA: Diagnosis not present

## 2014-08-12 DIAGNOSIS — R312 Other microscopic hematuria: Secondary | ICD-10-CM | POA: Diagnosis not present

## 2014-08-18 ENCOUNTER — Ambulatory Visit (INDEPENDENT_AMBULATORY_CARE_PROVIDER_SITE_OTHER): Payer: Medicare Other

## 2014-08-18 DIAGNOSIS — Z954 Presence of other heart-valve replacement: Secondary | ICD-10-CM

## 2014-08-18 DIAGNOSIS — I482 Chronic atrial fibrillation, unspecified: Secondary | ICD-10-CM

## 2014-08-18 DIAGNOSIS — Z7901 Long term (current) use of anticoagulants: Secondary | ICD-10-CM

## 2014-08-18 DIAGNOSIS — Z952 Presence of prosthetic heart valve: Secondary | ICD-10-CM

## 2014-08-18 LAB — POCT INR: INR: 5.3

## 2014-08-18 NOTE — Patient Outreach (Signed)
[facility]) Care Management  08/18/2014  Eric Wheeler 1919-02-04 [id number]   Referral from [place] [facility], assigned [name], RN to outreach.  Thanks, Ronnell Freshwater. [place], [place] Assistant Phone: [phone] Fax: [phone]

## 2014-08-24 ENCOUNTER — Telehealth: Payer: Self-pay | Admitting: Nurse Practitioner

## 2014-08-24 DIAGNOSIS — C670 Malignant neoplasm of trigone of bladder: Secondary | ICD-10-CM | POA: Diagnosis not present

## 2014-08-24 DIAGNOSIS — C675 Malignant neoplasm of bladder neck: Secondary | ICD-10-CM | POA: Diagnosis not present

## 2014-08-24 NOTE — Telephone Encounter (Signed)
I need to see some records.  I have been under impression that he was not going to have further treatment/procedures.

## 2014-08-24 NOTE — Telephone Encounter (Signed)
New message    Request for surgical clearance:  1. What type of surgery is being performed? Removal bladder tumor   2. When is this surgery scheduled? Pending    3. Are there any medications that need to be held prior to surgery and how long? Warfarin for 5 days prior.    4. Name of physician performing surgery? Dr. [name]   5. What is your office phone and fax number? [phone] /  ext [phone]. fax [phone]

## 2014-08-24 NOTE — Telephone Encounter (Signed)
Left office fax number to fax over paper work for surgical clearance from [facility] urology

## 2014-08-25 ENCOUNTER — Other Ambulatory Visit: Payer: Self-pay

## 2014-08-25 ENCOUNTER — Telehealth: Payer: Self-pay | Admitting: *Deleted

## 2014-08-25 NOTE — Telephone Encounter (Signed)
S/w P

## 2014-08-25 NOTE — Patient Outreach (Signed)
[facility]) Care Management  08/25/2014  Eric Wheeler [birth year] [id number]   SUBJECTIVE:  Telephone call to patient regarding high risk assessment.  Per EPIC note person authorized to speak with for patient is son in law, Eric Wheeler. Eric Wheeler contacted.  [facility] and [facility].  [name] states patient not in need of services at this time but will be having bladder surgery due to recurrent bladder cancer.  [name] in agreement to receive Eric Wheeler care management outreach letter and pamphlet to contact if needed.    PLAN: RNCM will send patient/son in law Eric Wheeler outreach letter and pamphlet. RNCM will refer to Eric Wheeler to close due to caregiver refusal of services. RNCM will notify patients primary of refusal of services.   Eric Plowman RN,BSN,CCM [place] Coordinator [phone]

## 2014-08-25 NOTE — Telephone Encounter (Signed)
S/w pam at [facility] urology will refax information over for surgical clearance for pt.

## 2014-08-28 NOTE — Patient Outreach (Signed)
[facility]) Care Management  08/25/2014  SHONTEZ SERMON [birth year] [id number]   Notification from [name], RN to close case due to patient refused [place] [facility] services.  Thanks, Ronnell Freshwater. Sophia, [place] Assistant Phone: [phone] Fax: [phone]

## 2014-08-31 NOTE — Telephone Encounter (Signed)
Will fax to [facility] at [facility] urology @ [phone]

## 2014-08-31 NOTE — Telephone Encounter (Signed)
I have completed his paper work for his GU procedure. Felt to be at increased risk.  Stop coumadin 5 days prior. Will be available as needed.

## 2014-09-01 ENCOUNTER — Ambulatory Visit (INDEPENDENT_AMBULATORY_CARE_PROVIDER_SITE_OTHER): Payer: Medicare Other | Admitting: *Deleted

## 2014-09-01 DIAGNOSIS — Z952 Presence of prosthetic heart valve: Secondary | ICD-10-CM

## 2014-09-01 DIAGNOSIS — Z954 Presence of other heart-valve replacement: Secondary | ICD-10-CM

## 2014-09-01 DIAGNOSIS — I482 Chronic atrial fibrillation, unspecified: Secondary | ICD-10-CM

## 2014-09-01 DIAGNOSIS — Z7901 Long term (current) use of anticoagulants: Secondary | ICD-10-CM | POA: Diagnosis not present

## 2014-09-01 LAB — POCT INR: INR: 1.9

## 2014-09-16 ENCOUNTER — Ambulatory Visit (INDEPENDENT_AMBULATORY_CARE_PROVIDER_SITE_OTHER): Payer: Medicare Other | Admitting: *Deleted

## 2014-09-16 DIAGNOSIS — Z954 Presence of other heart-valve replacement: Secondary | ICD-10-CM | POA: Diagnosis not present

## 2014-09-16 DIAGNOSIS — I482 Chronic atrial fibrillation, unspecified: Secondary | ICD-10-CM

## 2014-09-16 DIAGNOSIS — Z952 Presence of prosthetic heart valve: Secondary | ICD-10-CM

## 2014-09-16 DIAGNOSIS — Z7901 Long term (current) use of anticoagulants: Secondary | ICD-10-CM

## 2014-09-16 LAB — POCT INR: INR: 2.9

## 2014-09-28 ENCOUNTER — Encounter: Payer: Self-pay | Admitting: *Deleted

## 2014-09-30 ENCOUNTER — Ambulatory Visit (INDEPENDENT_AMBULATORY_CARE_PROVIDER_SITE_OTHER): Payer: Medicare Other | Admitting: *Deleted

## 2014-09-30 DIAGNOSIS — Z7901 Long term (current) use of anticoagulants: Secondary | ICD-10-CM

## 2014-09-30 DIAGNOSIS — Z952 Presence of prosthetic heart valve: Secondary | ICD-10-CM

## 2014-09-30 DIAGNOSIS — Z954 Presence of other heart-valve replacement: Secondary | ICD-10-CM | POA: Diagnosis not present

## 2014-09-30 DIAGNOSIS — I482 Chronic atrial fibrillation, unspecified: Secondary | ICD-10-CM

## 2014-09-30 LAB — POCT INR: INR: 3

## 2014-10-05 ENCOUNTER — Telehealth: Payer: Self-pay | Admitting: Nurse Practitioner

## 2014-10-05 DIAGNOSIS — R310 Gross hematuria: Secondary | ICD-10-CM | POA: Diagnosis not present

## 2014-10-05 DIAGNOSIS — C675 Malignant neoplasm of bladder neck: Secondary | ICD-10-CM | POA: Diagnosis not present

## 2014-10-05 DIAGNOSIS — C670 Malignant neoplasm of trigone of bladder: Secondary | ICD-10-CM | POA: Diagnosis not present

## 2014-10-05 NOTE — Telephone Encounter (Signed)
Spoke with pt's son in law and he states Mr Eric Wheeler had noted hematuria on Friday and nose bleed on Saturday Asking what he should do regarding his coumadin. Mr Eric Wheeler states that the pt states no longer having hematuria and no further nose bleeds Instructed we could see pt this afternoon to check his INR and if ok then will need to be checked by his other physicians and an appt made to be seen this afternoon for INR check

## 2014-10-05 NOTE — Telephone Encounter (Signed)
New message      Pt is on blood thinner----he is bleeding from his nose and has blood in his urine.  Should he have another coumadin ck?

## 2014-10-21 ENCOUNTER — Ambulatory Visit (INDEPENDENT_AMBULATORY_CARE_PROVIDER_SITE_OTHER): Payer: Medicare Other | Admitting: *Deleted

## 2014-10-21 DIAGNOSIS — Z954 Presence of other heart-valve replacement: Secondary | ICD-10-CM | POA: Diagnosis not present

## 2014-10-21 DIAGNOSIS — Z7901 Long term (current) use of anticoagulants: Secondary | ICD-10-CM | POA: Diagnosis not present

## 2014-10-21 DIAGNOSIS — Z952 Presence of prosthetic heart valve: Secondary | ICD-10-CM

## 2014-10-21 DIAGNOSIS — I482 Chronic atrial fibrillation, unspecified: Secondary | ICD-10-CM

## 2014-10-21 LAB — POCT INR: INR: 2.5

## 2014-11-17 ENCOUNTER — Ambulatory Visit (INDEPENDENT_AMBULATORY_CARE_PROVIDER_SITE_OTHER): Payer: Medicare Other | Admitting: *Deleted

## 2014-11-17 DIAGNOSIS — Z952 Presence of prosthetic heart valve: Secondary | ICD-10-CM

## 2014-11-17 DIAGNOSIS — I482 Chronic atrial fibrillation, unspecified: Secondary | ICD-10-CM

## 2014-11-17 DIAGNOSIS — Z954 Presence of other heart-valve replacement: Secondary | ICD-10-CM

## 2014-11-17 DIAGNOSIS — Z7901 Long term (current) use of anticoagulants: Secondary | ICD-10-CM

## 2014-11-17 LAB — POCT INR: INR: 3.2

## 2014-11-18 ENCOUNTER — Ambulatory Visit: Payer: Medicare Other | Admitting: Nurse Practitioner

## 2014-11-18 DIAGNOSIS — N501 Vascular disorders of male genital organs: Secondary | ICD-10-CM | POA: Diagnosis not present

## 2014-11-18 DIAGNOSIS — R351 Nocturia: Secondary | ICD-10-CM | POA: Diagnosis not present

## 2014-11-18 DIAGNOSIS — N390 Urinary tract infection, site not specified: Secondary | ICD-10-CM | POA: Diagnosis not present

## 2014-11-18 DIAGNOSIS — R350 Frequency of micturition: Secondary | ICD-10-CM | POA: Diagnosis not present

## 2014-11-18 DIAGNOSIS — S3994XA Unspecified injury of external genitals, initial encounter: Secondary | ICD-10-CM | POA: Diagnosis not present

## 2014-11-18 DIAGNOSIS — R339 Retention of urine, unspecified: Secondary | ICD-10-CM | POA: Diagnosis not present

## 2014-11-18 DIAGNOSIS — N403 Nodular prostate with lower urinary tract symptoms: Secondary | ICD-10-CM | POA: Diagnosis not present

## 2014-11-18 DIAGNOSIS — S3730XA Unspecified injury of urethra, initial encounter: Secondary | ICD-10-CM | POA: Diagnosis not present

## 2014-11-18 DIAGNOSIS — C670 Malignant neoplasm of trigone of bladder: Secondary | ICD-10-CM | POA: Diagnosis not present

## 2014-11-23 ENCOUNTER — Other Ambulatory Visit: Payer: Self-pay | Admitting: Nurse Practitioner

## 2014-11-23 NOTE — Telephone Encounter (Signed)
New message     *STAT* If patient is at the pharmacy, call can be transferred to refill team.   1. Which medications need to be refilled? (please list name of each medication and dose if known) diltiazem  240 mg   2. Which pharmacy/location (including street and city if local pharmacy) is medication to be sent to? [facility] on [address] in [place]  [phone]   3. Do they need a 30 day or 90 day supply? 90 days

## 2014-11-24 ENCOUNTER — Encounter: Payer: Self-pay | Admitting: Nurse Practitioner

## 2014-11-24 ENCOUNTER — Ambulatory Visit (INDEPENDENT_AMBULATORY_CARE_PROVIDER_SITE_OTHER): Payer: Medicare Other | Admitting: Nurse Practitioner

## 2014-11-24 VITALS — BP 150/62 | HR 68 | Ht 69.0 in | Wt 156.0 lb

## 2014-11-24 DIAGNOSIS — Z7901 Long term (current) use of anticoagulants: Secondary | ICD-10-CM | POA: Diagnosis not present

## 2014-11-24 DIAGNOSIS — I5022 Chronic systolic (congestive) heart failure: Secondary | ICD-10-CM

## 2014-11-24 DIAGNOSIS — Z952 Presence of prosthetic heart valve: Secondary | ICD-10-CM

## 2014-11-24 DIAGNOSIS — Z954 Presence of other heart-valve replacement: Secondary | ICD-10-CM | POA: Diagnosis not present

## 2014-11-24 DIAGNOSIS — I482 Chronic atrial fibrillation, unspecified: Secondary | ICD-10-CM

## 2014-11-24 DIAGNOSIS — E785 Hyperlipidemia, unspecified: Secondary | ICD-10-CM

## 2014-11-24 MED ORDER — DILTIAZEM HCL ER 240 MG PO CP24: 240.0000 mg | ORAL_CAPSULE | Freq: Every day | ORAL | Status: DC

## 2014-11-24 MED ORDER — CARVEDILOL 6.25 MG PO TABS: 6.2500 mg | ORAL_TABLET | Freq: Two times a day (BID) | ORAL | Status: DC

## 2014-11-24 MED ORDER — SIMVASTATIN 10 MG PO TABS: 10.0000 mg | ORAL_TABLET | Freq: Every day | ORAL | Status: DC

## 2014-11-24 MED ORDER — WARFARIN SODIUM 5 MG PO TABS: ORAL_TABLET | ORAL | Status: DC

## 2014-11-24 NOTE — Patient Instructions (Addendum)
We will be checking the following labs today - NONE  On your next coumadin check we will check your labs   Medication Instructions:    Continue with your current medicines. I sent in your refills today.     Testing/Procedures To Be Arranged:  N/A  Follow-Up:   See me in 6 months    Other Special Instructions:   N/A    If you need a refill on your cardiac medications before your next appointment, please call your pharmacy.   Call the [place] office at [phone] if you have any questions, problems or concerns.

## 2014-11-24 NOTE — Progress Notes (Signed)
CARDIOLOGY OFFICE NOTE  Date:  11/24/2014    Eric Wheeler Date of Birth: 07-31-1919 Medical Record #[id number]  PCP:  Eric Lenz, MD  Cardiologist:  Eric/Kavish Wheeler    Chief Complaint  Patient presents with  . Congestive Heart Failure    Follow up visit - seen for Dr. Alvino Wheeler - former patient of Dr. Susa Wheeler    History of Present Illness: Eric Wheeler is a 81 y.o. male who presents today for a follow up visit. This is a 6 month check. Seen for Dr. Percival Wheeler - who has not yet met - former patient of Dr. Susa Wheeler.  He has chronic systolic HF with EF of 45 to 50% (per echo 02/2013) with prior AVR in 1987, HTN and in chronic atrial fib - managed with rate control and coumadin. He is intolerant to ACE due to dizziness.   He had surgery last year in 2015 for a large bladder tumor - complicated by hematuria, obstruction, renal failure, AF with RVR - he has been back to the hospital several times. He has opted for conservative management with no plans for further treatment/chemo.  I last saw him in May and he was doing ok.   Comes back today. Here with son-in-law. Continues to do ok. Says "he doesn't have a pain anywhere or a care in the world". No chest pain. Weight down just 3 pounds. Some intermittent hematuria - seems to be worse with riding on his lawn mower. He did NOT proceed with repeat GU procedure back in August - family felt that with that last procedure in 2015 "just too risky". Tolerating his medicines. No falls. Family notes that he still has these spells of where he just stares off, stops breathing and then mouth starts "bobbing". He has had neuro evaluation for seizures which was negative. Has had a spell in July and September - Rain really has no recollection.   Past Medical History  Diagnosis Date  . Chronic atrial fibrillation ([place])   . Chronic anticoagulation     a. afib/st. jude avr - goal 2.5-3.5   . BPH (benign prostatic hypertrophy)   .  Hypertension   . History of kidney stones   . Gout   . Syncope   . History of skin cancer   . Bladder tumor     a. s/p resection 02/19/2013.  [name] Hx of scarlet fever   . Seizures ([place]) 2014    evaluated by Dr. Jannifer Wheeler at [facility] neurology -  no cause determined - refered back to cardiologist - recommended loop recorder to monitor rythm but pt refused - continues to have seizures per family - notes in EPIC  . Chronic combined systolic and diastolic CHF (congestive heart failure) (HCC)     a. EF 45 to 50% per echo in 2011 following exacerbation  . Chronic kidney disease   . Cancer ([place])     carcinoma in situ of bladder   . Disorder of fluid or electrolyte   . Elevated PSA   . Hematuria   . Penile bleeding   . Urethral trauma   . Urinary retention   . Arthritis   . Gout   . GERD (gastroesophageal reflux disease)   . Kidney stone   . Heart murmur   . Pyuria   . Skin cancer     Past Surgical History  Procedure Laterality Date  . Aortic valve replacement  1987    #21 MM ST JUDE PLACED BY DR.CHARLES WILSON  .  Cataract extraction    . Transurethral resection of prostate N/A 02/19/2013    Procedure: TRANSURETHRAL RESECTION OF THE PROSTATE /bladder WITH GYRUS INSTRUMENTS;  Surgeon: Eric Seal, MD;  Location: [facility];  Service: Urology;  Laterality: N/A;  . Cystoscopy N/A 02/19/2013    Procedure: CYSTOSCOPY;  Surgeon: Eric Seal, MD;  Location: [facility];  Service: Urology;  Laterality: N/A;  . Skin biopsy       Medications: Current Outpatient Prescriptions  Medication Sig Dispense Refill  . carvedilol (COREG) 6.25 MG tablet Take 1 tablet (6.25 mg total) by mouth 2 (two) times daily with a meal. 180 tablet 3  . diltiazem (DILACOR XR) 240 MG 24 hr capsule Take 1 capsule (240 mg total) by mouth daily. 90 capsule 3  . feeding supplement, ENSURE COMPLETE, (ENSURE COMPLETE) LIQD Take 237 mLs by mouth 2 (two) times daily between meals. (Patient taking differently: Take 237 mLs by mouth daily as  needed (nutrtional supplement). ) 10 Bottle 0  . finasteride (PROSCAR) 5 MG tablet Take 5 mg by mouth every morning.     . simvastatin (ZOCOR) 10 MG tablet Take 1 tablet (10 mg total) by mouth at bedtime. 90 tablet 3  . terazosin (HYTRIN) 5 MG capsule Take 5 mg by mouth daily.     [name] warfarin (COUMADIN) 5 MG tablet TAKE AS DIRECTED BY COUMADIN CLINIC 90 tablet 3   No current facility-administered medications for this visit.    Allergies: Allergies  Allergen Reactions  . Ace Inhibitors Other (See Comments)    Dizziness   . Codeine Nausea And Vomiting  . Sulfa Drugs Cross Reactors Nausea And Vomiting    Social History: The patient  reports that he quit smoking about 43 years ago. He has never used smokeless tobacco. He reports that he does not drink alcohol or use illicit drugs.   Family History: The patient's family history includes Asthma in his father; Cancer in his mother; Dementia in his sister; Heart disease in his brother and father; Stroke in his father.   Review of Systems: Please see the history of present illness.   Otherwise, the review of systems is positive for none.   All other systems are reviewed and negative.   Physical Exam: VS:  BP 150/62 mmHg  Pulse 68  Ht $R'5\' 9"'lb$  (1.753 m)  Wt 156 lb (70.761 kg)  BMI 23.03 kg/m2  SpO2 99% .  BMI Body mass index is 23.03 kg/(m^2).  Wt Readings from Last 3 Encounters:  11/24/14 156 lb (70.761 kg)  05/20/14 159 lb 6.4 oz (72.303 kg)  11/19/13 168 lb (76.204 kg)    General: Pleasant. He looks good today. Has lost 3 more pounds. He is in no acute distress.  HEENT: Normal. Neck: Supple, no JVD, carotid bruits, or masses noted.  Cardiac: Irregular irregular rhythm. Rate is ok. No edema. Valve is crisp.  Respiratory:  Lungs are clear to auscultation bilaterally with normal work of breathing.  GI: Soft and nontender.  MS: No deformity or atrophy. Gait and ROM intact. Skin: Warm and dry. Color is normal.  Neuro:  Strength and  sensation are intact and no gross focal deficits noted.  Psych: Alert, appropriate and with normal affect.   LABORATORY DATA:  EKG:  EKG is not ordered today.  Lab Results  Component Value Date   WBC 7.9 08/08/2014   HGB 11.9* 08/08/2014   HCT 38.3* 08/08/2014   PLT 155 08/08/2014   GLUCOSE 109* 08/08/2014   CHOL 135  07/21/2013   TRIG 84.0 07/21/2013   HDL 37.60* 07/21/2013   LDLCALC 81 07/21/2013   ALT 7 07/21/2013   AST 17 07/21/2013   NA 137 08/08/2014   K 5.1 08/08/2014   CL 105 08/08/2014   CREATININE 1.47* 08/08/2014   BUN 40* 08/08/2014   CO2 24 08/08/2014   TSH 2.77 06/16/2014   INR 3.2 11/17/2014   Lab Results  Component Value Date   INR 3.2 11/17/2014   INR 2.5 10/21/2014   INR 3.0 09/30/2014     BNP (last 3 results) No results for input(s): BNP in the last 8760 hours.  ProBNP (last 3 results) No results for input(s): PROBNP in the last 8760 hours.   Other Studies Reviewed Today:   Assessment/Plan: Echo Study Conclusions from February 2015  - Left ventricle: The cavity size was normal. Wall thickness was normal. Systolic function was mildly reduced. The estimated ejection fraction was in the range of 45% to 50%. Diffuse hypokinesis. Indeterminant diastolic function (atrial fibrillation). - Aortic valve: St Jude mechanical aortic valve. No significant prosthetic valve regurgitation or stenosis. Mean gradient: 83mm Hg (S). - Mitral valve: Moderately calcified annulus. Moderately calcified leaflets . Mild to moderate regurgitation. - Left atrium: The atrium was mildly to moderately dilated. - Right ventricle: The cavity size was mildly dilated. Systolic function was normal. - Right atrium: The atrium was moderately dilated. - Tricuspid valve: Peak RV-RA gradient: 33mm Hg (S). Maximal regurgitant velocity: 292cm/s. - Pulmonary arteries: PA peak pressure: 74mm Hg (S). - Systemic veins: IVC measured 2.3 cm with < 50% respirophasic variation,  suggesting RA pressure 15 mmHg. Impressions:  - The patient was in atrial fibrillation. Normal LV size with EF 45-50%, mild diffuse hypokinesis. Mild to moderate MR. St Jude mechanical aortic valve appeared to function normally. Mildly dilated RV with normal systolic function. Moderate pulmonary hypertension.  Assessment / Plan:  1. Chronic atrial fib - rate is controlled and he is anticoagulated - he is asymptomatic. I have left him on his current regimen.   2. Prior resection of large bladder tumor - complicated by renal failure, UTI, urinary retention - sees Dr. [name] - no further therapy for his bladder cancer- Not felt to have reserve for continued complications. His foley is out and he is able to void now.   3. Prior AVR   4. Systolic HF - has not really been an issue for some time. His volume status seems stable  5. Advanced age - Does seem to be getting a little more feeble - but stable overall.  Current medicines are reviewed with the patient today.  The patient does not have concerns regarding medicines other than what has been noted above.  The following changes have been made:  See above.  Labs/ tests ordered today include:    Orders Placed This Encounter  Procedures  . Basic metabolic panel  . CBC  . Hepatic function panel  . Lipid panel     Disposition:   FU with me in 6 months.   Patient is agreeable to this plan and will call if any problems develop in the interim.   Signed: Burtis Junes, RN, ANP-C 11/24/2014 1:46 PM  [facility] [address] [place], [place]  [address] Phone: [phone] Fax: [phone]

## 2014-12-08 ENCOUNTER — Other Ambulatory Visit (INDEPENDENT_AMBULATORY_CARE_PROVIDER_SITE_OTHER): Payer: Medicare Other

## 2014-12-08 ENCOUNTER — Ambulatory Visit (INDEPENDENT_AMBULATORY_CARE_PROVIDER_SITE_OTHER): Payer: Medicare Other | Admitting: *Deleted

## 2014-12-08 DIAGNOSIS — Z952 Presence of prosthetic heart valve: Secondary | ICD-10-CM

## 2014-12-08 DIAGNOSIS — Z954 Presence of other heart-valve replacement: Secondary | ICD-10-CM

## 2014-12-08 DIAGNOSIS — I5022 Chronic systolic (congestive) heart failure: Secondary | ICD-10-CM | POA: Diagnosis not present

## 2014-12-08 DIAGNOSIS — Z7901 Long term (current) use of anticoagulants: Secondary | ICD-10-CM

## 2014-12-08 DIAGNOSIS — I4891 Unspecified atrial fibrillation: Secondary | ICD-10-CM | POA: Diagnosis not present

## 2014-12-08 DIAGNOSIS — I482 Chronic atrial fibrillation, unspecified: Secondary | ICD-10-CM

## 2014-12-08 DIAGNOSIS — E785 Hyperlipidemia, unspecified: Secondary | ICD-10-CM

## 2014-12-08 LAB — HEPATIC FUNCTION PANEL
ALT: 12 U/L (ref 9–46)
AST: 14 U/L (ref 10–35)
Albumin: 3.2 g/dL — ABNORMAL LOW (ref 3.6–5.1)
Alkaline Phosphatase: 70 U/L (ref 40–115)
Bilirubin, Direct: 0.1 mg/dL (ref <=0.2)
Indirect Bilirubin: 0.3 mg/dL (ref 0.2–1.2)
Total Bilirubin: 0.4 mg/dL (ref 0.2–1.2)
Total Protein: 5.8 g/dL — ABNORMAL LOW (ref 6.1–8.1)

## 2014-12-08 LAB — CBC
HCT: 35.7 % — ABNORMAL LOW (ref 39.0–52.0)
Hemoglobin: 11.5 g/dL — ABNORMAL LOW (ref 13.0–17.0)
MCH: 29.8 pg (ref 26.0–34.0)
MCHC: 32.2 g/dL (ref 30.0–36.0)
MCV: 92.5 fL (ref 78.0–100.0)
MPV: 11.2 fL (ref 8.6–12.4)
Platelets: 160 10*3/uL (ref 150–400)
RBC: 3.86 MIL/uL — ABNORMAL LOW (ref 4.22–5.81)
RDW: 14.6 % (ref 11.5–15.5)
WBC: 6 10*3/uL (ref 4.0–10.5)

## 2014-12-08 LAB — BASIC METABOLIC PANEL
BUN: 37 mg/dL — ABNORMAL HIGH (ref 7–25)
CO2: 27 mmol/L (ref 20–31)
Calcium: 8.4 mg/dL — ABNORMAL LOW (ref 8.6–10.3)
Chloride: 108 mmol/L (ref 98–110)
Creat: 1.4 mg/dL — ABNORMAL HIGH (ref 0.70–1.11)
Glucose, Bld: 111 mg/dL — ABNORMAL HIGH (ref 65–99)
Potassium: 4.2 mmol/L (ref 3.5–5.3)
Sodium: 141 mmol/L (ref 135–146)

## 2014-12-08 LAB — LIPID PANEL
Cholesterol: 146 mg/dL (ref 125–200)
HDL: 42 mg/dL (ref >=40)
LDL Cholesterol: 88 mg/dL (ref <130)
Total CHOL/HDL Ratio: 3.5 Ratio (ref <=5.0)
Triglycerides: 79 mg/dL (ref <150)
VLDL: 16 mg/dL (ref <30)

## 2014-12-08 LAB — POCT INR: INR: 3.1

## 2015-01-05 ENCOUNTER — Ambulatory Visit (INDEPENDENT_AMBULATORY_CARE_PROVIDER_SITE_OTHER): Payer: Medicare Other | Admitting: Pharmacist

## 2015-01-05 DIAGNOSIS — Z952 Presence of prosthetic heart valve: Secondary | ICD-10-CM

## 2015-01-05 DIAGNOSIS — I482 Chronic atrial fibrillation, unspecified: Secondary | ICD-10-CM

## 2015-01-05 DIAGNOSIS — Z954 Presence of other heart-valve replacement: Secondary | ICD-10-CM | POA: Diagnosis not present

## 2015-01-05 DIAGNOSIS — Z7901 Long term (current) use of anticoagulants: Secondary | ICD-10-CM | POA: Diagnosis not present

## 2015-01-05 LAB — POCT INR: INR: 4.4

## 2015-01-06 ENCOUNTER — Encounter: Payer: Self-pay | Admitting: *Deleted

## 2015-01-27 ENCOUNTER — Ambulatory Visit (INDEPENDENT_AMBULATORY_CARE_PROVIDER_SITE_OTHER): Payer: Medicare Other | Admitting: *Deleted

## 2015-01-27 DIAGNOSIS — Z7901 Long term (current) use of anticoagulants: Secondary | ICD-10-CM | POA: Diagnosis not present

## 2015-01-27 DIAGNOSIS — Z954 Presence of other heart-valve replacement: Secondary | ICD-10-CM | POA: Diagnosis not present

## 2015-01-27 DIAGNOSIS — Z952 Presence of prosthetic heart valve: Secondary | ICD-10-CM

## 2015-01-27 DIAGNOSIS — I482 Chronic atrial fibrillation, unspecified: Secondary | ICD-10-CM

## 2015-01-27 LAB — POCT INR: INR: 3.1

## 2015-02-22 DIAGNOSIS — R351 Nocturia: Secondary | ICD-10-CM | POA: Diagnosis not present

## 2015-02-22 DIAGNOSIS — C670 Malignant neoplasm of trigone of bladder: Secondary | ICD-10-CM | POA: Diagnosis not present

## 2015-02-22 DIAGNOSIS — C675 Malignant neoplasm of bladder neck: Secondary | ICD-10-CM | POA: Diagnosis not present

## 2015-02-22 DIAGNOSIS — R310 Gross hematuria: Secondary | ICD-10-CM | POA: Diagnosis not present

## 2015-02-22 DIAGNOSIS — N403 Nodular prostate with lower urinary tract symptoms: Secondary | ICD-10-CM | POA: Diagnosis not present

## 2015-02-22 DIAGNOSIS — Z0000 Encounter for general adult medical examination without abnormal findings: Secondary | ICD-10-CM | POA: Diagnosis not present

## 2015-02-24 ENCOUNTER — Ambulatory Visit (INDEPENDENT_AMBULATORY_CARE_PROVIDER_SITE_OTHER): Payer: Medicare Other | Admitting: Pharmacist

## 2015-02-24 DIAGNOSIS — Z7901 Long term (current) use of anticoagulants: Secondary | ICD-10-CM

## 2015-02-24 DIAGNOSIS — I482 Chronic atrial fibrillation, unspecified: Secondary | ICD-10-CM

## 2015-02-24 DIAGNOSIS — Z952 Presence of prosthetic heart valve: Secondary | ICD-10-CM

## 2015-02-24 DIAGNOSIS — Z954 Presence of other heart-valve replacement: Secondary | ICD-10-CM | POA: Diagnosis not present

## 2015-02-24 LAB — POCT INR: INR: 3.2

## 2015-03-18 ENCOUNTER — Ambulatory Visit (INDEPENDENT_AMBULATORY_CARE_PROVIDER_SITE_OTHER): Payer: Medicare Other | Admitting: *Deleted

## 2015-03-18 DIAGNOSIS — Z954 Presence of other heart-valve replacement: Secondary | ICD-10-CM

## 2015-03-18 DIAGNOSIS — Z7901 Long term (current) use of anticoagulants: Secondary | ICD-10-CM

## 2015-03-18 DIAGNOSIS — I482 Chronic atrial fibrillation, unspecified: Secondary | ICD-10-CM

## 2015-03-18 DIAGNOSIS — Z952 Presence of prosthetic heart valve: Secondary | ICD-10-CM

## 2015-03-18 LAB — POCT INR: INR: 3.3

## 2015-04-08 ENCOUNTER — Ambulatory Visit (INDEPENDENT_AMBULATORY_CARE_PROVIDER_SITE_OTHER): Payer: Medicare Other | Admitting: *Deleted

## 2015-04-08 DIAGNOSIS — Z7901 Long term (current) use of anticoagulants: Secondary | ICD-10-CM

## 2015-04-08 DIAGNOSIS — I482 Chronic atrial fibrillation, unspecified: Secondary | ICD-10-CM

## 2015-04-08 DIAGNOSIS — Z954 Presence of other heart-valve replacement: Secondary | ICD-10-CM

## 2015-04-08 DIAGNOSIS — Z952 Presence of prosthetic heart valve: Secondary | ICD-10-CM

## 2015-04-08 LAB — POCT INR: INR: 3.3

## 2015-05-07 ENCOUNTER — Ambulatory Visit (INDEPENDENT_AMBULATORY_CARE_PROVIDER_SITE_OTHER): Payer: Medicare Other | Admitting: *Deleted

## 2015-05-07 DIAGNOSIS — I482 Chronic atrial fibrillation, unspecified: Secondary | ICD-10-CM

## 2015-05-07 DIAGNOSIS — Z7901 Long term (current) use of anticoagulants: Secondary | ICD-10-CM | POA: Diagnosis not present

## 2015-05-07 DIAGNOSIS — Z954 Presence of other heart-valve replacement: Secondary | ICD-10-CM

## 2015-05-07 DIAGNOSIS — Z952 Presence of prosthetic heart valve: Secondary | ICD-10-CM

## 2015-05-07 LAB — POCT INR: INR: 3.2

## 2015-05-19 DIAGNOSIS — C675 Malignant neoplasm of bladder neck: Secondary | ICD-10-CM | POA: Diagnosis not present

## 2015-05-19 DIAGNOSIS — C670 Malignant neoplasm of trigone of bladder: Secondary | ICD-10-CM | POA: Diagnosis not present

## 2015-05-19 DIAGNOSIS — Z0000 Encounter for general adult medical examination without abnormal findings: Secondary | ICD-10-CM | POA: Diagnosis not present

## 2015-05-19 DIAGNOSIS — N403 Nodular prostate with lower urinary tract symptoms: Secondary | ICD-10-CM | POA: Diagnosis not present

## 2015-05-19 DIAGNOSIS — R351 Nocturia: Secondary | ICD-10-CM | POA: Diagnosis not present

## 2015-05-19 DIAGNOSIS — N390 Urinary tract infection, site not specified: Secondary | ICD-10-CM | POA: Diagnosis not present

## 2015-05-25 ENCOUNTER — Encounter: Payer: Self-pay | Admitting: Nurse Practitioner

## 2015-05-25 ENCOUNTER — Ambulatory Visit (INDEPENDENT_AMBULATORY_CARE_PROVIDER_SITE_OTHER): Payer: Medicare Other | Admitting: Nurse Practitioner

## 2015-05-25 VITALS — BP 160/80 | HR 72 | Ht 70.0 in | Wt 150.4 lb

## 2015-05-25 DIAGNOSIS — E785 Hyperlipidemia, unspecified: Secondary | ICD-10-CM | POA: Diagnosis not present

## 2015-05-25 DIAGNOSIS — I482 Chronic atrial fibrillation, unspecified: Secondary | ICD-10-CM

## 2015-05-25 DIAGNOSIS — Z7901 Long term (current) use of anticoagulants: Secondary | ICD-10-CM | POA: Diagnosis not present

## 2015-05-25 DIAGNOSIS — I4891 Unspecified atrial fibrillation: Secondary | ICD-10-CM | POA: Diagnosis not present

## 2015-05-25 LAB — LIPID PANEL
Cholesterol: 161 mg/dL (ref 125–200)
HDL: 45 mg/dL (ref >=40)
LDL Cholesterol: 100 mg/dL (ref <130)
Total CHOL/HDL Ratio: 3.6 Ratio (ref <=5.0)
Triglycerides: 78 mg/dL (ref <150)
VLDL: 16 mg/dL (ref <30)

## 2015-05-25 LAB — CBC
HCT: 34.9 % — ABNORMAL LOW (ref 38.5–50.0)
Hemoglobin: 11.1 g/dL — ABNORMAL LOW (ref 13.2–17.1)
MCH: 29.1 pg (ref 27.0–33.0)
MCHC: 31.8 g/dL — ABNORMAL LOW (ref 32.0–36.0)
MCV: 91.4 fL (ref 80.0–100.0)
MPV: 10.7 fL (ref 7.5–12.5)
Platelets: 202 10*3/uL (ref 140–400)
RBC: 3.82 MIL/uL — ABNORMAL LOW (ref 4.20–5.80)
RDW: 14.9 % (ref 11.0–15.0)
WBC: 7.5 10*3/uL (ref 3.8–10.8)

## 2015-05-25 LAB — HEPATIC FUNCTION PANEL
ALT: 5 U/L — ABNORMAL LOW (ref 9–46)
AST: 8 U/L — ABNORMAL LOW (ref 10–35)
Albumin: 3.3 g/dL — ABNORMAL LOW (ref 3.6–5.1)
Alkaline Phosphatase: 81 U/L (ref 40–115)
Bilirubin, Direct: 0.1 mg/dL (ref <=0.2)
Indirect Bilirubin: 0.4 mg/dL (ref 0.2–1.2)
Total Bilirubin: 0.5 mg/dL (ref 0.2–1.2)
Total Protein: 5.8 g/dL — ABNORMAL LOW (ref 6.1–8.1)

## 2015-05-25 LAB — BASIC METABOLIC PANEL
BUN: 36 mg/dL — ABNORMAL HIGH (ref 7–25)
CO2: 23 mmol/L (ref 20–31)
Calcium: 8.3 mg/dL — ABNORMAL LOW (ref 8.6–10.3)
Chloride: 112 mmol/L — ABNORMAL HIGH (ref 98–110)
Creat: 1.71 mg/dL — ABNORMAL HIGH (ref 0.70–1.11)
Glucose, Bld: 95 mg/dL (ref 65–99)
Potassium: 4.6 mmol/L (ref 3.5–5.3)
Sodium: 144 mmol/L (ref 135–146)

## 2015-05-25 MED ORDER — SIMVASTATIN 10 MG PO TABS: 10.0000 mg | ORAL_TABLET | Freq: Every day | ORAL | Status: AC

## 2015-05-25 MED ORDER — CARVEDILOL 6.25 MG PO TABS: 6.2500 mg | ORAL_TABLET | Freq: Two times a day (BID) | ORAL | Status: DC

## 2015-05-25 MED ORDER — DILTIAZEM HCL ER 240 MG PO CP24: 240.0000 mg | ORAL_CAPSULE | Freq: Every day | ORAL | Status: AC

## 2015-05-25 NOTE — Progress Notes (Addendum)
CARDIOLOGY OFFICE NOTE  Date:  05/25/2015    [name] Date of Birth: [birth year] Medical Record #[id number]  PCP:  Leonard Downing, MD  Cardiologist:  [facility]    Chief Complaint  Patient presents with  . Congestive Heart Failure  . Atrial Fibrillation  . Cardiac Valve Problem    Follow up visit - seen for Dr. Percival Spanish    History of Present Illness: Eric Wheeler is a 82 y.o. male who presents today for a follow up visit. This is a 6 month check. Seen for Dr. Percival Spanish - who has actually never yet met - former patient of Dr. Susa Simmonds that has followed with me.   He has chronic systolic HF with EF of 45 to 50% (per echo 02/2013) with prior AVR in 1987, HTN and in chronic atrial fib - managed with rate control and coumadin. He is intolerant to ACE due to dizziness.   He had surgery last year in 2015 for a large bladder tumor - complicated by hematuria, obstruction, renal failure, AF with RVR - he has been back to the hospital several times. He has opted for conservative management with no plans for further treatment/chemo. 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   I last saw him in November and he was doing ok. Still opting for conservative management of his bladder cancer.   Comes back today. Here with son-in-law and wife today.   Past Medical History  Diagnosis Date  . Chronic atrial fibrillation ([place])   . Chronic anticoagulation     a. afib/st. jude avr - goal 2.5-3.5   . BPH (benign prostatic hypertrophy)   . Hypertension   . History of kidney stones   . Gout   . Syncope   . History of skin cancer   . Bladder tumor     a. s/p resection 02/19/2013.  [name] Hx of scarlet fever   . Seizures ([place]) 2014    evaluated by Dr. Jannifer Franklin at [facility] neurology -  no cause determined - refered back to cardiologist - recommended loop recorder to monitor rythm but pt refused - continues to have seizures per family - notes in  EPIC  . Chronic combined systolic and diastolic CHF (congestive heart failure) (HCC)     a. EF 45 to 50% per echo in 2011 following exacerbation  . Chronic kidney disease   . Cancer ([place])     carcinoma in situ of bladder   . Disorder of fluid or electrolyte   . Elevated PSA   . Hematuria   . Penile bleeding   . Urethral trauma   . Urinary retention   . Arthritis   . Gout   . GERD (gastroesophageal reflux disease)   . Kidney stone   . Heart murmur   . Pyuria   . Skin cancer     Past Surgical History  Procedure Laterality Date  . Aortic valve replacement  1987    #21 MM ST JUDE PLACED BY DR.CHARLES WILSON  . Cataract extraction    . Transurethral resection of prostate N/A 02/19/2013    Procedure: TRANSURETHRAL RESECTION OF THE PROSTATE /bladder WITH GYRUS INSTRUMENTS;  Surgeon: Irine Seal, MD;  Location: [facility];  Service: Urology;  Laterality: N/A;  . Cystoscopy N/A 02/19/2013    Procedure: CYSTOSCOPY;  Surgeon: Irine Seal, MD;  Location: [facility];  Service: Urology;  Laterality: N/A;  . Skin biopsy  Medications: Current Outpatient Prescriptions  Medication Sig Dispense Refill  . carvedilol (COREG) 6.25 MG tablet Take 1 tablet (6.25 mg total) by mouth 2 (two) times daily with a meal. 180 tablet 3  . diltiazem (DILACOR XR) 240 MG 24 hr capsule Take 1 capsule (240 mg total) by mouth daily. 90 capsule 3  . feeding supplement, ENSURE COMPLETE, (ENSURE COMPLETE) LIQD Take 237 mLs by mouth 2 (two) times daily between meals. (Patient taking differently: Take 237 mLs by mouth daily as needed (nutrtional supplement). ) 10 Bottle 0  . finasteride (PROSCAR) 5 MG tablet Take 5 mg by mouth every morning.     . simvastatin (ZOCOR) 10 MG tablet Take 1 tablet (10 mg total) by mouth at bedtime. 90 tablet 3  . terazosin (HYTRIN) 5 MG capsule Take 5 mg by mouth daily.     [name] warfarin (COUMADIN) 5 MG tablet TAKE AS DIRECTED BY COUMADIN CLINIC 90 tablet 3   No current facility-administered  medications for this visit.    Allergies: Allergies  Allergen Reactions  . Ace Inhibitors Other (See Comments)    Dizziness   . Codeine Nausea And Vomiting  . Sulfa Drugs Cross Reactors Nausea And Vomiting    Social History: The patient  reports that he quit smoking about 44 years ago. He has never used smokeless tobacco. He reports that he does not drink alcohol or use illicit drugs.   Family History: The patient's family history includes Asthma in his father; Cancer in his mother; Dementia in his sister; Heart disease in his brother and father; Stroke in his father.   Review of Systems: Please see the history of present illness.   Otherwise, the review of systems is positive for none.   All other systems are reviewed and negative.   Physical Exam: VS:  BP 160/80 mmHg  Pulse 72  Ht '5\' 10"'$  (1.778 m)  Wt 150 lb 6.4 oz (68.221 kg)  BMI 21.58 kg/m2 .  BMI Body mass index is 21.58 kg/(m^2).  Wt Readings from Last 3 Encounters:  05/25/15 150 lb 6.4 oz (68.221 kg)  11/24/14 156 lb (70.761 kg)  05/20/14 159 lb 6.4 oz (72.303 kg)   BP is 130/80 by me.   General: Pleasant. Elderly male who is alert and in no acute distress. He is a little hard of hearing. He is getting thinner. Little more frail.   HEENT: Normal. Neck: Supple, no JVD, carotid bruits, or masses noted.  Cardiac: Irregular irregular rhythm. Rate is ok. No edema.  Respiratory:  Lungs are clear to auscultation bilaterally with normal work of breathing.  GI: Soft and nontender.  MS: No deformity or atrophy. Gait and ROM intact. Skin: Warm and dry. Color is normal.  Neuro:  Strength and sensation are intact and no gross focal deficits noted.  Psych: Alert, appropriate and with normal affect.   LABORATORY DATA:  EKG:  EKG is not ordered today.  Lab Results  Component Value Date   WBC 6.0 12/08/2014   HGB 11.5* 12/08/2014   HCT 35.7* 12/08/2014   PLT 160 12/08/2014   GLUCOSE 111* 12/08/2014   CHOL 146  12/08/2014   TRIG 79 12/08/2014   HDL 42 12/08/2014   LDLCALC 88 12/08/2014   ALT 12 12/08/2014   AST 14 12/08/2014   NA 141 12/08/2014   K 4.2 12/08/2014   CL 108 12/08/2014   CREATININE 1.40* 12/08/2014   BUN 37* 12/08/2014   CO2 27 12/08/2014   TSH 2.77 06/16/2014  INR 3.2 05/07/2015    BNP (last 3 results) No results for input(s): BNP in the last 8760 hours.  ProBNP (last 3 results) No results for input(s): PROBNP in the last 8760 hours.   Other Studies Reviewed Today:  Echo Study Conclusions from February 2015  - Left ventricle: The cavity size was normal. Wall thickness was normal. Systolic function was mildly reduced. The estimated ejection fraction was in the range of 45% to 50%. Diffuse hypokinesis. Indeterminant diastolic function (atrial fibrillation). - Aortic valve: St Jude mechanical aortic valve. No significant prosthetic valve regurgitation or stenosis. Mean gradient: 4m Hg (S). - Mitral valve: Moderately calcified annulus. Moderately calcified leaflets . Mild to moderate regurgitation. - Left atrium: The atrium was mildly to moderately dilated. - Right ventricle: The cavity size was mildly dilated. Systolic function was normal. - Right atrium: The atrium was moderately dilated. - Tricuspid valve: Peak RV-RA gradient: 358mHg (S). Maximal regurgitant velocity: 292cm/s. - Pulmonary arteries: PA peak pressure: 5036mg (S). - Systemic veins: IVC measured 2.3 cm with < 50% respirophasic variation, suggesting RA pressure 15 mmHg. Impressions:  - The patient was in atrial fibrillation. Normal LV size with EF 45-50%, mild diffuse hypokinesis. Mild to moderate MR. St Jude mechanical aortic valve appeared to function normally. Mildly dilated RV with normal systolic function. Moderate pulmonary hypertension.  Assessment / Plan:  1. Chronic atrial fib - rate is controlled and he is anticoagulated - he is asymptomatic. I have left him on his current  regimen.   2. Prior resection of large bladder tumor - complicated by renal failure, UTI, urinary retention - sees Dr. WreJeffie Pollockhe has opted for no further surgical therapy for his bladder cancer- Not felt to have reserve for continued complications. His foley is out and he is able to void now. Currently not bleeding.   3. Prior AVR - St Jude - from 1987  4. Systolic HF - has not really been an issue for some time. His volume status seems stable. Weight continues to go down.   5. Advanced age - Does seem to be getting a little more feeble - but stable overall. Encouraged him to drink some Ensure/Boost daily.   6. Falls - encouraged him to use his cane/walker. Safety would be his primary goal.   Current medicines are reviewed with the patient today.  The patient does not have concerns regarding medicines other than what has been noted above.  The following changes have been made:  See above.  Labs/ tests ordered today include:    Orders Placed This Encounter  Procedures  . Basic metabolic panel  . CBC  . Hepatic function panel  . Lipid panel     Disposition:   FU with me in 6 months.   Patient is agreeable to this plan and will call if any problems develop in the interim.   Signed: LorBurtis JunesN, ANP-C 05/25/2015 1:57 PM  [facility] [address] [place]  [phone]: [phone]: [phone]

## 2015-05-25 NOTE — Patient Instructions (Addendum)
We will be checking the following labs today - BMET, CBC, Lipids and HPF   Medication Instructions:    Continue with your current medicines.   I did send in your refills today.     Testing/Procedures To Be Arranged:  N/A  Follow-Up:   See [place] back in 6 months.     Other Special Instructions:   N/A    If you need a refill on your cardiac medications before your next appointment, please call your pharmacy.   Call the [place] office at [phone] if you have any questions, problems or concerns.

## 2015-06-04 ENCOUNTER — Ambulatory Visit (INDEPENDENT_AMBULATORY_CARE_PROVIDER_SITE_OTHER): Payer: Medicare Other | Admitting: *Deleted

## 2015-06-04 DIAGNOSIS — Z954 Presence of other heart-valve replacement: Secondary | ICD-10-CM

## 2015-06-04 DIAGNOSIS — Z7901 Long term (current) use of anticoagulants: Secondary | ICD-10-CM | POA: Diagnosis not present

## 2015-06-04 DIAGNOSIS — Z952 Presence of prosthetic heart valve: Secondary | ICD-10-CM

## 2015-06-04 DIAGNOSIS — I482 Chronic atrial fibrillation, unspecified: Secondary | ICD-10-CM

## 2015-06-04 LAB — POCT INR: INR: 1.9

## 2015-06-18 ENCOUNTER — Ambulatory Visit (INDEPENDENT_AMBULATORY_CARE_PROVIDER_SITE_OTHER): Payer: Medicare Other | Admitting: *Deleted

## 2015-06-18 DIAGNOSIS — Z954 Presence of other heart-valve replacement: Secondary | ICD-10-CM | POA: Diagnosis not present

## 2015-06-18 DIAGNOSIS — I482 Chronic atrial fibrillation, unspecified: Secondary | ICD-10-CM

## 2015-06-18 DIAGNOSIS — Z952 Presence of prosthetic heart valve: Secondary | ICD-10-CM

## 2015-06-18 LAB — POCT INR: INR: 3.5

## 2015-07-09 ENCOUNTER — Ambulatory Visit (INDEPENDENT_AMBULATORY_CARE_PROVIDER_SITE_OTHER): Payer: Medicare Other

## 2015-07-09 DIAGNOSIS — I482 Chronic atrial fibrillation, unspecified: Secondary | ICD-10-CM

## 2015-07-09 DIAGNOSIS — Z954 Presence of other heart-valve replacement: Secondary | ICD-10-CM | POA: Diagnosis not present

## 2015-07-09 DIAGNOSIS — Z952 Presence of prosthetic heart valve: Secondary | ICD-10-CM

## 2015-07-09 LAB — POCT INR: INR: 2.8

## 2015-08-05 ENCOUNTER — Ambulatory Visit (INDEPENDENT_AMBULATORY_CARE_PROVIDER_SITE_OTHER): Payer: Medicare Other | Admitting: *Deleted

## 2015-08-05 DIAGNOSIS — Z954 Presence of other heart-valve replacement: Secondary | ICD-10-CM | POA: Diagnosis not present

## 2015-08-05 DIAGNOSIS — Z952 Presence of prosthetic heart valve: Secondary | ICD-10-CM

## 2015-08-05 DIAGNOSIS — I482 Chronic atrial fibrillation, unspecified: Secondary | ICD-10-CM

## 2015-08-05 LAB — POCT INR: INR: 2.8

## 2015-09-02 ENCOUNTER — Ambulatory Visit (INDEPENDENT_AMBULATORY_CARE_PROVIDER_SITE_OTHER): Payer: Medicare Other

## 2015-09-02 DIAGNOSIS — Z952 Presence of prosthetic heart valve: Secondary | ICD-10-CM

## 2015-09-02 DIAGNOSIS — I482 Chronic atrial fibrillation, unspecified: Secondary | ICD-10-CM

## 2015-09-02 DIAGNOSIS — Z954 Presence of other heart-valve replacement: Secondary | ICD-10-CM | POA: Diagnosis not present

## 2015-09-02 LAB — POCT INR: INR: 3.7

## 2015-09-08 DIAGNOSIS — R351 Nocturia: Secondary | ICD-10-CM | POA: Diagnosis not present

## 2015-09-08 DIAGNOSIS — C670 Malignant neoplasm of trigone of bladder: Secondary | ICD-10-CM | POA: Diagnosis not present

## 2015-09-08 DIAGNOSIS — R310 Gross hematuria: Secondary | ICD-10-CM | POA: Diagnosis not present

## 2015-09-08 DIAGNOSIS — N403 Nodular prostate with lower urinary tract symptoms: Secondary | ICD-10-CM | POA: Diagnosis not present

## 2015-09-29 ENCOUNTER — Ambulatory Visit (INDEPENDENT_AMBULATORY_CARE_PROVIDER_SITE_OTHER): Payer: Medicare Other | Admitting: Pharmacist

## 2015-09-29 DIAGNOSIS — I482 Chronic atrial fibrillation, unspecified: Secondary | ICD-10-CM

## 2015-09-29 DIAGNOSIS — Z954 Presence of other heart-valve replacement: Secondary | ICD-10-CM | POA: Diagnosis not present

## 2015-09-29 DIAGNOSIS — Z952 Presence of prosthetic heart valve: Secondary | ICD-10-CM

## 2015-09-29 LAB — POCT INR: INR: 2.1

## 2015-10-20 ENCOUNTER — Ambulatory Visit (INDEPENDENT_AMBULATORY_CARE_PROVIDER_SITE_OTHER): Payer: Medicare Other | Admitting: *Deleted

## 2015-10-20 DIAGNOSIS — Z952 Presence of prosthetic heart valve: Secondary | ICD-10-CM | POA: Diagnosis not present

## 2015-10-20 DIAGNOSIS — I482 Chronic atrial fibrillation, unspecified: Secondary | ICD-10-CM

## 2015-10-20 LAB — POCT INR: INR: 2.5

## 2015-11-02 ENCOUNTER — Encounter: Payer: Self-pay | Admitting: Nurse Practitioner

## 2015-11-16 ENCOUNTER — Ambulatory Visit (INDEPENDENT_AMBULATORY_CARE_PROVIDER_SITE_OTHER): Payer: Medicare Other

## 2015-11-16 ENCOUNTER — Ambulatory Visit (INDEPENDENT_AMBULATORY_CARE_PROVIDER_SITE_OTHER): Payer: Medicare Other | Admitting: Nurse Practitioner

## 2015-11-16 ENCOUNTER — Encounter: Payer: Self-pay | Admitting: Nurse Practitioner

## 2015-11-16 VITALS — BP 100/62 | HR 58 | Ht 69.0 in | Wt 145.4 lb

## 2015-11-16 DIAGNOSIS — Z952 Presence of prosthetic heart valve: Secondary | ICD-10-CM

## 2015-11-16 DIAGNOSIS — I482 Chronic atrial fibrillation, unspecified: Secondary | ICD-10-CM

## 2015-11-16 DIAGNOSIS — Z7901 Long term (current) use of anticoagulants: Secondary | ICD-10-CM | POA: Diagnosis not present

## 2015-11-16 DIAGNOSIS — E7800 Pure hypercholesterolemia, unspecified: Secondary | ICD-10-CM | POA: Diagnosis not present

## 2015-11-16 DIAGNOSIS — R634 Abnormal weight loss: Secondary | ICD-10-CM

## 2015-11-16 LAB — CBC
HCT: 31.6 % — ABNORMAL LOW (ref 38.5–50.0)
Hemoglobin: 9.9 g/dL — ABNORMAL LOW (ref 13.2–17.1)
MCH: 29.3 pg (ref 27.0–33.0)
MCHC: 31.3 g/dL — ABNORMAL LOW (ref 32.0–36.0)
MCV: 93.5 fL (ref 80.0–100.0)
MPV: 10.9 fL (ref 7.5–12.5)
Platelets: 196 10*3/uL (ref 140–400)
RBC: 3.38 MIL/uL — ABNORMAL LOW (ref 4.20–5.80)
RDW: 14.8 % (ref 11.0–15.0)
WBC: 6.1 10*3/uL (ref 3.8–10.8)

## 2015-11-16 LAB — POCT INR: INR: 4.3

## 2015-11-16 NOTE — Patient Instructions (Addendum)
We will be checking the following labs today - BMET, HPF, Lipids, TSH &CBC  Medication Instructions:    Continue with your current medicines. BUT  I am stopping the Coreg    Testing/Procedures To Be Arranged:  N/A  Follow-Up:   See me in 6 months. But let me know sooner if the blood pressure does not come up.     Other Special Instructions:   Keep a check on his BP for me - let me know if it is not coming up.     If you need a refill on your cardiac medications before your next appointment, please call your pharmacy.   Call the [place] office at [phone] if you have any questions, problems or concerns.

## 2015-11-16 NOTE — Progress Notes (Signed)
CARDIOLOGY OFFICE NOTE  Date:  11/16/2015    [name] Date of Birth: [birth year] Medical Record #[id number]  PCP:  Eric Downing, MD  Cardiologist:  Eric Wheeler    Chief Complaint  Patient presents with  . Atrial Fibrillation    Follow up visit - seen for Eric Wheeler    History of Present Illness: Eric Wheeler is a 82 y.o. male who presents today for a 6 month check. Seen for Eric Wheeler - who has actually never yet met - former patient of Eric Wheeler that has followed with me.   He has chronic systolic HF with EF of 45 to 50% (per echo 02/2013) with prior AVR in 1987, HTN and in chronic atrial fib - managed with rate control and coumadin. He is intolerant to ACE due to dizziness.   He had surgery in 2015 for a large bladder tumor - complicated by hematuria, obstruction, renal failure, AF with RVR - he has been back to the hospital several times. He has opted for conservative management with no plans for further treatment/chemo. 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   I last saw him in May and he was doing ok. Still opting for conservative management of his bladder cancer.   Comes back today. Here with daughter and wife today. Just turned 89 last month. He notes he is doing well but daughter says that "as soon as we leave here, he will complain of no energy and weakness". Sits most of the day. Complains of being cold. BP running lower at home. He has had some dizziness. No falls.   Past Medical History:  Diagnosis Date  . Arthritis   . Bladder tumor    a. s/p resection 02/19/2013.  [name] BPH (benign prostatic hypertrophy)   . Cancer ([place])    carcinoma in situ of bladder   . Chronic anticoagulation    a. afib/st. jude avr - goal 2.5-3.5   . Chronic atrial fibrillation ([place])   . Chronic combined systolic and diastolic CHF (congestive heart failure) ([facility])    a. EF 45 to 50% per echo in 2011  following exacerbation  . Chronic kidney disease   . Disorder of fluid or electrolyte   . Elevated PSA   . GERD (gastroesophageal reflux disease)   . Gout   . Gout   . Heart murmur   . Hematuria   . History of kidney stones   . History of skin cancer   . Hx of scarlet fever   . Hypertension   . Kidney stone   . Penile bleeding   . Pyuria   . Seizures ([place]) 2014   evaluated by Eric Wheeler at [facility] neurology -  no cause determined - refered back to cardiologist - recommended loop recorder to monitor rythm but pt refused - continues to have seizures per family - notes in EPIC  . Skin cancer   . Syncope   . Urethral trauma   . Urinary retention     Past Surgical History:  Procedure Laterality Date  . AORTIC VALVE REPLACEMENT  1987   #21 MM ST JUDE PLACED BY EricCHARLES WILSON  . CATARACT EXTRACTION    . CYSTOSCOPY N/A 02/19/2013   Procedure: CYSTOSCOPY;  Surgeon: Eric Seal, MD;  Location: [facility];  Service: Urology;  Laterality: N/A;  . SKIN BIOPSY    . TRANSURETHRAL RESECTION OF PROSTATE N/A 02/19/2013   Procedure: TRANSURETHRAL  RESECTION OF THE PROSTATE /bladder WITH GYRUS INSTRUMENTS;  Surgeon: Eric Seal, MD;  Location: [facility];  Service: Urology;  Laterality: N/A;     Medications: Current Outpatient Prescriptions  Medication Sig Dispense Refill  . diltiazem (DILACOR XR) 240 MG 24 hr capsule Take 1 capsule (240 mg total) by mouth daily. 90 capsule 3  . feeding supplement, ENSURE COMPLETE, (ENSURE COMPLETE) LIQD Take 237 mLs by mouth 2 (two) times daily between meals. (Patient taking differently: Take 237 mLs by mouth daily as needed (nutrtional supplement). ) 10 Bottle 0  . finasteride (PROSCAR) 5 MG tablet Take 5 mg by mouth every morning.     . simvastatin (ZOCOR) 10 MG tablet Take 1 tablet (10 mg total) by mouth at bedtime. 90 tablet 3  . terazosin (HYTRIN) 5 MG capsule Take 5 mg by mouth daily.     [name] warfarin (COUMADIN) 5 MG tablet TAKE AS DIRECTED BY COUMADIN CLINIC 90  tablet 3   No current facility-administered medications for this visit.     Allergies: Allergies  Allergen Reactions  . Ace Inhibitors Other (See Comments)    Dizziness   . Codeine Nausea And Vomiting  . Sulfa Drugs Cross Reactors Nausea And Vomiting    Social History: The patient  reports that he quit smoking about 44 years ago. He has never used smokeless tobacco. He reports that he does not drink alcohol or use drugs.   Family History: The patient's family history includes Asthma in his father; Cancer in his mother; Dementia in his sister; Heart disease in his brother and father; Stroke in his father.   Review of Systems: Please see the history of present illness.   Otherwise, the review of systems is positive for none.   All other systems are reviewed and negative.   Physical Exam: VS:  BP 100/62   Pulse (!) 58   Ht '5\' 9"'$  (1.753 m)   Wt 145 lb 6.4 oz (66 kg)   SpO2 100% Comment: at rest  BMI 21.47 kg/m  .  BMI Body mass index is 21.47 kg/m.  Wt Readings from Last 3 Encounters:  11/16/15 145 lb 6.4 oz (66 kg)  05/25/15 150 lb 6.4 oz (68.2 kg)  11/24/14 156 lb (70.8 kg)    General: Pleasant. Elderly male who is alert and in no acute distress.  His weight is down 5 pounds.  HEENT: Normal.  Neck: Supple, no JVD, carotid bruits, or masses noted.  Cardiac: Irregular irregular rhythm. Rate is ok.  No murmurs, rubs, or gallops. No edema.  Respiratory:  Lungs are clear to auscultation bilaterally with normal work of breathing.  GI: Soft and nontender.  MS: No deformity or atrophy. Gait and ROM intact. He is using a cane.   Skin: Warm and dry. Color is normal.  Neuro:  Strength and sensation are intact and no gross focal deficits noted.  Psych: Alert, appropriate and with normal affect.   LABORATORY DATA:  EKG:  EKG is not ordered today.  Lab Results  Component Value Date   WBC 7.5 05/25/2015   HGB 11.1 (L) 05/25/2015   HCT 34.9 (L) 05/25/2015   PLT 202  05/25/2015   GLUCOSE 95 05/25/2015   CHOL 161 05/25/2015   TRIG 78 05/25/2015   HDL 45 05/25/2015   LDLCALC 100 05/25/2015   ALT 5 (L) 05/25/2015   AST 8 (L) 05/25/2015   NA 144 05/25/2015   K 4.6 05/25/2015   CL 112 (H) 05/25/2015  CREATININE 1.71 (H) 05/25/2015   BUN 36 (H) 05/25/2015   CO2 23 05/25/2015   TSH 2.77 06/16/2014   INR 2.5 10/20/2015    BNP (last 3 results) No results for input(s): BNP in the last 8760 hours.  ProBNP (last 3 results) No results for input(s): PROBNP in the last 8760 hours.   Other Studies Reviewed Today:  Echo Study Conclusions from February 2015  - Left ventricle: The cavity size was normal. Wall thickness was normal. Systolic function was mildly reduced. The estimated ejection fraction was in the range of 45% to 50%. Diffuse hypokinesis. Indeterminant diastolic function (atrial fibrillation). - Aortic valve: St Jude mechanical aortic valve. No significant prosthetic valve regurgitation or stenosis. Mean gradient: 81m Hg (S). - Mitral valve: Moderately calcified annulus. Moderately calcified leaflets . Mild to moderate regurgitation. - Left atrium: The atrium was mildly to moderately dilated. - Right ventricle: The cavity size was mildly dilated. Systolic function was normal. - Right atrium: The atrium was moderately dilated. - Tricuspid valve: Peak RV-RA gradient: 366mHg (S). Maximal regurgitant velocity: 292cm/s. - Pulmonary arteries: PA peak pressure: 5026mg (S). - Systemic veins: IVC measured 2.3 cm with < 50% respirophasic variation, suggesting RA pressure 15 mmHg. Impressions:  - The patient was in atrial fibrillation. Normal LV size with EF 45-50%, mild diffuse hypokinesis. Mild to moderate MR. St Jude mechanical aortic valve appeared to function normally. Mildly dilated RV with normal systolic function. Moderate pulmonary hypertension.     Assessment / Plan:  1. Chronic/persistent atrial fib - rate is controlled  and he is anticoagulated - he is asymptomatic. I have left him on his current regimen.   2. Prior resection of large bladder tumor - complicated by renal failure, UTI, urinary retention - sees Dr. WreJeffie Pollockhe has opted for no further surgical therapy for his bladder cancer- Not felt to have reserve for continued complications. His foley is out and he is able to void now. Currently not bleeding.   3. Prior AVR - St Jude - from 1987  4. Systolic HF - has not really been an issue for some time. His volume status seems stable. Weight continues to go down.   5. Advanced age - Does seem to be getting a little more feeble - but stable overall. Encouraged him to drink some Ensure/Boost daily.   6. Low BP - stopping Coreg today - would like his BP to be higher. He seems to be in a general decline overall. Lab today. Supportive care. Family will monitor the BP for me and let me know if we need to cut back his CCB.    Current medicines are reviewed with the patient today.  The patient does not have concerns regarding medicines other than what has been noted above.  The following changes have been made:  See above.  Labs/ tests ordered today include:    Orders Placed This Encounter  Procedures  . TSH  . Basic metabolic panel  . CBC  . Hepatic function panel  . Lipid panel     Disposition:   FU with me in 6 months.   Patient is agreeable to this plan and will call if any problems develop in the interim.   Signed: LorBurtis JunesN, ANP-C 11/16/2015 11:35 AM  [place]  [id number]: [phone]: [phone]

## 2015-11-17 ENCOUNTER — Other Ambulatory Visit: Payer: Self-pay | Admitting: *Deleted

## 2015-11-17 DIAGNOSIS — Z79899 Other long term (current) drug therapy: Secondary | ICD-10-CM

## 2015-11-17 DIAGNOSIS — D62 Acute posthemorrhagic anemia: Secondary | ICD-10-CM

## 2015-11-17 LAB — HEPATIC FUNCTION PANEL
ALT: 7 U/L — ABNORMAL LOW (ref 9–46)
AST: 10 U/L (ref 10–35)
Albumin: 3.4 g/dL — ABNORMAL LOW (ref 3.6–5.1)
Alkaline Phosphatase: 60 U/L (ref 40–115)
Bilirubin, Direct: 0.1 mg/dL (ref <=0.2)
Indirect Bilirubin: 0.3 mg/dL (ref 0.2–1.2)
Total Bilirubin: 0.4 mg/dL (ref 0.2–1.2)
Total Protein: 5.7 g/dL — ABNORMAL LOW (ref 6.1–8.1)

## 2015-11-17 LAB — LIPID PANEL
Cholesterol: 168 mg/dL (ref <200)
HDL: 45 mg/dL (ref >40)
LDL Cholesterol: 107 mg/dL — ABNORMAL HIGH
Total CHOL/HDL Ratio: 3.7 Ratio (ref <5.0)
Triglycerides: 81 mg/dL (ref <150)
VLDL: 16 mg/dL (ref <30)

## 2015-11-17 LAB — BASIC METABOLIC PANEL
BUN: 48 mg/dL — ABNORMAL HIGH (ref 7–25)
CO2: 21 mmol/L (ref 20–31)
Calcium: 7.8 mg/dL — ABNORMAL LOW (ref 8.6–10.3)
Chloride: 111 mmol/L — ABNORMAL HIGH (ref 98–110)
Creat: 2.17 mg/dL — ABNORMAL HIGH (ref 0.70–1.11)
Glucose, Bld: 92 mg/dL (ref 65–99)
Potassium: 5.1 mmol/L (ref 3.5–5.3)
Sodium: 144 mmol/L (ref 135–146)

## 2015-11-17 LAB — TSH: TSH: 5.83 mIU/L — ABNORMAL HIGH (ref 0.40–4.50)

## 2015-11-17 MED ORDER — ADULT MULTIVITAMIN W/MINERALS CH: 1.0000 | ORAL_TABLET | Freq: Every day | ORAL | Status: AC

## 2015-11-30 ENCOUNTER — Other Ambulatory Visit: Payer: Medicare Other | Admitting: *Deleted

## 2015-11-30 ENCOUNTER — Ambulatory Visit (INDEPENDENT_AMBULATORY_CARE_PROVIDER_SITE_OTHER): Payer: Medicare Other | Admitting: *Deleted

## 2015-11-30 DIAGNOSIS — Z79899 Other long term (current) drug therapy: Secondary | ICD-10-CM | POA: Diagnosis not present

## 2015-11-30 DIAGNOSIS — I482 Chronic atrial fibrillation, unspecified: Secondary | ICD-10-CM

## 2015-11-30 DIAGNOSIS — D62 Acute posthemorrhagic anemia: Secondary | ICD-10-CM | POA: Diagnosis not present

## 2015-11-30 DIAGNOSIS — Z952 Presence of prosthetic heart valve: Secondary | ICD-10-CM

## 2015-11-30 LAB — CBC WITH DIFFERENTIAL/PLATELET
Basophils Absolute: 0 cells/uL (ref 0–200)
Basophils Relative: 0 %
Eosinophils Absolute: 70 cells/uL (ref 15–500)
Eosinophils Relative: 1 %
HCT: 31.1 % — ABNORMAL LOW (ref 38.5–50.0)
Hemoglobin: 9.7 g/dL — ABNORMAL LOW (ref 13.2–17.1)
Lymphocytes Relative: 7 %
Lymphs Abs: 490 cells/uL — ABNORMAL LOW (ref 850–3900)
MCH: 29.6 pg (ref 27.0–33.0)
MCHC: 31.2 g/dL — ABNORMAL LOW (ref 32.0–36.0)
MCV: 94.8 fL (ref 80.0–100.0)
MPV: 11 fL (ref 7.5–12.5)
Monocytes Absolute: 910 cells/uL (ref 200–950)
Monocytes Relative: 13 %
Neutro Abs: 5530 cells/uL (ref 1500–7800)
Neutrophils Relative %: 79 %
Platelets: 190 10*3/uL (ref 140–400)
RBC: 3.28 MIL/uL — ABNORMAL LOW (ref 4.20–5.80)
RDW: 14.9 % (ref 11.0–15.0)
WBC: 7 10*3/uL (ref 3.8–10.8)

## 2015-11-30 LAB — POCT INR: INR: 3.7

## 2015-11-30 LAB — TSH: TSH: 8.25 mIU/L — ABNORMAL HIGH (ref 0.40–4.50)

## 2015-12-01 ENCOUNTER — Other Ambulatory Visit: Payer: Self-pay | Admitting: *Deleted

## 2015-12-01 DIAGNOSIS — E039 Hypothyroidism, unspecified: Secondary | ICD-10-CM

## 2015-12-01 MED ORDER — LEVOTHYROXINE SODIUM 25 MCG PO CAPS: 25.0000 ug | ORAL_CAPSULE | Freq: Every day | ORAL | 3 refills | Status: DC

## 2015-12-01 MED ORDER — LEVOTHYROXINE SODIUM 25 MCG PO TABS: 25.0000 ug | ORAL_TABLET | Freq: Every day | ORAL | 3 refills | Status: AC

## 2015-12-01 NOTE — Addendum Note (Signed)
Addended by: Derl Barrow on: 12/01/2015 10:06 AM   Modules accepted: Orders

## 2015-12-24 ENCOUNTER — Ambulatory Visit (INDEPENDENT_AMBULATORY_CARE_PROVIDER_SITE_OTHER): Payer: Medicare Other | Admitting: Pharmacist Clinician (PhC)/ Clinical Pharmacy Specialist

## 2015-12-24 DIAGNOSIS — I482 Chronic atrial fibrillation, unspecified: Secondary | ICD-10-CM

## 2015-12-24 DIAGNOSIS — Z952 Presence of prosthetic heart valve: Secondary | ICD-10-CM

## 2015-12-24 LAB — POCT INR: INR: 4.8

## 2015-12-29 ENCOUNTER — Ambulatory Visit (INDEPENDENT_AMBULATORY_CARE_PROVIDER_SITE_OTHER): Payer: Medicare Other | Admitting: Pharmacist

## 2015-12-29 DIAGNOSIS — I482 Chronic atrial fibrillation, unspecified: Secondary | ICD-10-CM

## 2015-12-29 DIAGNOSIS — Z952 Presence of prosthetic heart valve: Secondary | ICD-10-CM

## 2015-12-29 LAB — POCT INR: INR: 2.9

## 2016-01-12 ENCOUNTER — Ambulatory Visit (INDEPENDENT_AMBULATORY_CARE_PROVIDER_SITE_OTHER): Payer: Medicare Other | Admitting: *Deleted

## 2016-01-12 ENCOUNTER — Other Ambulatory Visit: Payer: Medicare Other | Admitting: *Deleted

## 2016-01-12 DIAGNOSIS — I482 Chronic atrial fibrillation, unspecified: Secondary | ICD-10-CM

## 2016-01-12 DIAGNOSIS — Z952 Presence of prosthetic heart valve: Secondary | ICD-10-CM | POA: Diagnosis not present

## 2016-01-12 DIAGNOSIS — I5032 Chronic diastolic (congestive) heart failure: Secondary | ICD-10-CM | POA: Diagnosis not present

## 2016-01-12 DIAGNOSIS — I4891 Unspecified atrial fibrillation: Secondary | ICD-10-CM | POA: Diagnosis not present

## 2016-01-12 LAB — POCT INR: INR: 3

## 2016-01-13 LAB — TSH: TSH: 4.34 u[IU]/mL (ref 0.450–4.500)

## 2016-01-31 DIAGNOSIS — N403 Nodular prostate with lower urinary tract symptoms: Secondary | ICD-10-CM | POA: Diagnosis not present

## 2016-01-31 DIAGNOSIS — N3941 Urge incontinence: Secondary | ICD-10-CM | POA: Diagnosis not present

## 2016-01-31 DIAGNOSIS — C670 Malignant neoplasm of trigone of bladder: Secondary | ICD-10-CM | POA: Diagnosis not present

## 2016-01-31 DIAGNOSIS — R310 Gross hematuria: Secondary | ICD-10-CM | POA: Diagnosis not present

## 2016-01-31 DIAGNOSIS — R351 Nocturia: Secondary | ICD-10-CM | POA: Diagnosis not present

## 2016-02-02 ENCOUNTER — Ambulatory Visit (INDEPENDENT_AMBULATORY_CARE_PROVIDER_SITE_OTHER): Payer: Medicare Other | Admitting: *Deleted

## 2016-02-02 DIAGNOSIS — I482 Chronic atrial fibrillation, unspecified: Secondary | ICD-10-CM

## 2016-02-02 DIAGNOSIS — Z952 Presence of prosthetic heart valve: Secondary | ICD-10-CM | POA: Diagnosis not present

## 2016-02-02 LAB — POCT INR: INR: 4.3

## 2016-02-15 ENCOUNTER — Ambulatory Visit (INDEPENDENT_AMBULATORY_CARE_PROVIDER_SITE_OTHER): Payer: Medicare Other | Admitting: Pharmacist

## 2016-02-15 DIAGNOSIS — I482 Chronic atrial fibrillation, unspecified: Secondary | ICD-10-CM

## 2016-02-15 DIAGNOSIS — Z952 Presence of prosthetic heart valve: Secondary | ICD-10-CM | POA: Diagnosis not present

## 2016-02-15 LAB — POCT INR: INR: 1.7

## 2016-02-29 ENCOUNTER — Ambulatory Visit (INDEPENDENT_AMBULATORY_CARE_PROVIDER_SITE_OTHER): Payer: Medicare Other | Admitting: Cardiology

## 2016-02-29 DIAGNOSIS — N403 Nodular prostate with lower urinary tract symptoms: Secondary | ICD-10-CM | POA: Diagnosis not present

## 2016-02-29 DIAGNOSIS — R569 Unspecified convulsions: Secondary | ICD-10-CM | POA: Diagnosis not present

## 2016-02-29 DIAGNOSIS — N320 Bladder-neck obstruction: Secondary | ICD-10-CM | POA: Diagnosis not present

## 2016-02-29 DIAGNOSIS — R339 Retention of urine, unspecified: Secondary | ICD-10-CM | POA: Diagnosis not present

## 2016-02-29 DIAGNOSIS — I4891 Unspecified atrial fibrillation: Secondary | ICD-10-CM | POA: Diagnosis not present

## 2016-02-29 DIAGNOSIS — I10 Essential (primary) hypertension: Secondary | ICD-10-CM | POA: Diagnosis not present

## 2016-02-29 DIAGNOSIS — Z87891 Personal history of nicotine dependence: Secondary | ICD-10-CM | POA: Diagnosis not present

## 2016-02-29 DIAGNOSIS — C675 Malignant neoplasm of bladder neck: Secondary | ICD-10-CM | POA: Diagnosis not present

## 2016-02-29 DIAGNOSIS — I509 Heart failure, unspecified: Secondary | ICD-10-CM | POA: Diagnosis not present

## 2016-02-29 DIAGNOSIS — N189 Chronic kidney disease, unspecified: Secondary | ICD-10-CM | POA: Diagnosis not present

## 2016-02-29 DIAGNOSIS — I482 Chronic atrial fibrillation, unspecified: Secondary | ICD-10-CM

## 2016-02-29 DIAGNOSIS — C670 Malignant neoplasm of trigone of bladder: Secondary | ICD-10-CM | POA: Diagnosis not present

## 2016-02-29 DIAGNOSIS — Z952 Presence of prosthetic heart valve: Secondary | ICD-10-CM

## 2016-02-29 LAB — POCT INR: INR: 2.2

## 2016-03-01 DIAGNOSIS — C670 Malignant neoplasm of trigone of bladder: Secondary | ICD-10-CM | POA: Diagnosis not present

## 2016-03-01 DIAGNOSIS — N320 Bladder-neck obstruction: Secondary | ICD-10-CM | POA: Diagnosis not present

## 2016-03-01 DIAGNOSIS — I4891 Unspecified atrial fibrillation: Secondary | ICD-10-CM | POA: Diagnosis not present

## 2016-03-01 DIAGNOSIS — R339 Retention of urine, unspecified: Secondary | ICD-10-CM | POA: Diagnosis not present

## 2016-03-01 DIAGNOSIS — C675 Malignant neoplasm of bladder neck: Secondary | ICD-10-CM | POA: Diagnosis not present

## 2016-03-01 DIAGNOSIS — I509 Heart failure, unspecified: Secondary | ICD-10-CM | POA: Diagnosis not present

## 2016-03-02 DIAGNOSIS — R339 Retention of urine, unspecified: Secondary | ICD-10-CM | POA: Diagnosis not present

## 2016-03-02 DIAGNOSIS — C670 Malignant neoplasm of trigone of bladder: Secondary | ICD-10-CM | POA: Diagnosis not present

## 2016-03-02 DIAGNOSIS — C675 Malignant neoplasm of bladder neck: Secondary | ICD-10-CM | POA: Diagnosis not present

## 2016-03-02 DIAGNOSIS — I4891 Unspecified atrial fibrillation: Secondary | ICD-10-CM | POA: Diagnosis not present

## 2016-03-02 DIAGNOSIS — I509 Heart failure, unspecified: Secondary | ICD-10-CM | POA: Diagnosis not present

## 2016-03-02 DIAGNOSIS — N320 Bladder-neck obstruction: Secondary | ICD-10-CM | POA: Diagnosis not present

## 2016-03-06 DIAGNOSIS — I4891 Unspecified atrial fibrillation: Secondary | ICD-10-CM | POA: Diagnosis not present

## 2016-03-06 DIAGNOSIS — C675 Malignant neoplasm of bladder neck: Secondary | ICD-10-CM | POA: Diagnosis not present

## 2016-03-06 DIAGNOSIS — I509 Heart failure, unspecified: Secondary | ICD-10-CM | POA: Diagnosis not present

## 2016-03-06 DIAGNOSIS — C670 Malignant neoplasm of trigone of bladder: Secondary | ICD-10-CM | POA: Diagnosis not present

## 2016-03-06 DIAGNOSIS — R339 Retention of urine, unspecified: Secondary | ICD-10-CM | POA: Diagnosis not present

## 2016-03-06 DIAGNOSIS — N320 Bladder-neck obstruction: Secondary | ICD-10-CM | POA: Diagnosis not present

## 2016-03-07 ENCOUNTER — Ambulatory Visit: Payer: Medicare Other | Admitting: Nurse Practitioner

## 2016-03-08 DIAGNOSIS — I4891 Unspecified atrial fibrillation: Secondary | ICD-10-CM | POA: Diagnosis not present

## 2016-03-08 DIAGNOSIS — N320 Bladder-neck obstruction: Secondary | ICD-10-CM | POA: Diagnosis not present

## 2016-03-08 DIAGNOSIS — C675 Malignant neoplasm of bladder neck: Secondary | ICD-10-CM | POA: Diagnosis not present

## 2016-03-08 DIAGNOSIS — I509 Heart failure, unspecified: Secondary | ICD-10-CM | POA: Diagnosis not present

## 2016-03-08 DIAGNOSIS — R339 Retention of urine, unspecified: Secondary | ICD-10-CM | POA: Diagnosis not present

## 2016-03-08 DIAGNOSIS — C670 Malignant neoplasm of trigone of bladder: Secondary | ICD-10-CM | POA: Diagnosis not present

## 2016-03-09 DIAGNOSIS — I4891 Unspecified atrial fibrillation: Secondary | ICD-10-CM | POA: Diagnosis not present

## 2016-03-09 DIAGNOSIS — C675 Malignant neoplasm of bladder neck: Secondary | ICD-10-CM | POA: Diagnosis not present

## 2016-03-09 DIAGNOSIS — I10 Essential (primary) hypertension: Secondary | ICD-10-CM | POA: Diagnosis not present

## 2016-03-09 DIAGNOSIS — N189 Chronic kidney disease, unspecified: Secondary | ICD-10-CM | POA: Diagnosis not present

## 2016-03-09 DIAGNOSIS — C670 Malignant neoplasm of trigone of bladder: Secondary | ICD-10-CM | POA: Diagnosis not present

## 2016-03-09 DIAGNOSIS — N320 Bladder-neck obstruction: Secondary | ICD-10-CM | POA: Diagnosis not present

## 2016-03-09 DIAGNOSIS — I509 Heart failure, unspecified: Secondary | ICD-10-CM | POA: Diagnosis not present

## 2016-03-09 DIAGNOSIS — N403 Nodular prostate with lower urinary tract symptoms: Secondary | ICD-10-CM | POA: Diagnosis not present

## 2016-03-09 DIAGNOSIS — R339 Retention of urine, unspecified: Secondary | ICD-10-CM | POA: Diagnosis not present

## 2016-03-09 DIAGNOSIS — Z87891 Personal history of nicotine dependence: Secondary | ICD-10-CM | POA: Diagnosis not present

## 2016-03-09 DIAGNOSIS — R569 Unspecified convulsions: Secondary | ICD-10-CM | POA: Diagnosis not present

## 2016-03-10 DIAGNOSIS — R339 Retention of urine, unspecified: Secondary | ICD-10-CM | POA: Diagnosis not present

## 2016-03-10 DIAGNOSIS — I4891 Unspecified atrial fibrillation: Secondary | ICD-10-CM | POA: Diagnosis not present

## 2016-03-10 DIAGNOSIS — C675 Malignant neoplasm of bladder neck: Secondary | ICD-10-CM | POA: Diagnosis not present

## 2016-03-10 DIAGNOSIS — I509 Heart failure, unspecified: Secondary | ICD-10-CM | POA: Diagnosis not present

## 2016-03-10 DIAGNOSIS — C670 Malignant neoplasm of trigone of bladder: Secondary | ICD-10-CM | POA: Diagnosis not present

## 2016-03-10 DIAGNOSIS — N320 Bladder-neck obstruction: Secondary | ICD-10-CM | POA: Diagnosis not present

## 2016-03-13 ENCOUNTER — Other Ambulatory Visit: Payer: Self-pay | Admitting: Nurse Practitioner

## 2016-03-14 DIAGNOSIS — C670 Malignant neoplasm of trigone of bladder: Secondary | ICD-10-CM | POA: Diagnosis not present

## 2016-03-14 DIAGNOSIS — I509 Heart failure, unspecified: Secondary | ICD-10-CM | POA: Diagnosis not present

## 2016-03-14 DIAGNOSIS — R339 Retention of urine, unspecified: Secondary | ICD-10-CM | POA: Diagnosis not present

## 2016-03-14 DIAGNOSIS — C675 Malignant neoplasm of bladder neck: Secondary | ICD-10-CM | POA: Diagnosis not present

## 2016-03-14 DIAGNOSIS — N320 Bladder-neck obstruction: Secondary | ICD-10-CM | POA: Diagnosis not present

## 2016-03-14 DIAGNOSIS — I4891 Unspecified atrial fibrillation: Secondary | ICD-10-CM | POA: Diagnosis not present

## 2016-03-14 LAB — PROTIME-INR: INR: 3.7 — AB (ref <=1.1)

## 2016-03-15 ENCOUNTER — Ambulatory Visit (INDEPENDENT_AMBULATORY_CARE_PROVIDER_SITE_OTHER): Payer: Medicare Other | Admitting: Interventional Cardiology

## 2016-03-15 DIAGNOSIS — I509 Heart failure, unspecified: Secondary | ICD-10-CM | POA: Diagnosis not present

## 2016-03-15 DIAGNOSIS — N320 Bladder-neck obstruction: Secondary | ICD-10-CM | POA: Diagnosis not present

## 2016-03-15 DIAGNOSIS — C670 Malignant neoplasm of trigone of bladder: Secondary | ICD-10-CM | POA: Diagnosis not present

## 2016-03-15 DIAGNOSIS — I482 Chronic atrial fibrillation, unspecified: Secondary | ICD-10-CM

## 2016-03-15 DIAGNOSIS — C675 Malignant neoplasm of bladder neck: Secondary | ICD-10-CM | POA: Diagnosis not present

## 2016-03-15 DIAGNOSIS — I4891 Unspecified atrial fibrillation: Secondary | ICD-10-CM | POA: Diagnosis not present

## 2016-03-15 DIAGNOSIS — R339 Retention of urine, unspecified: Secondary | ICD-10-CM | POA: Diagnosis not present

## 2016-03-15 DIAGNOSIS — Z952 Presence of prosthetic heart valve: Secondary | ICD-10-CM

## 2016-03-16 DIAGNOSIS — R339 Retention of urine, unspecified: Secondary | ICD-10-CM | POA: Diagnosis not present

## 2016-03-16 DIAGNOSIS — C675 Malignant neoplasm of bladder neck: Secondary | ICD-10-CM | POA: Diagnosis not present

## 2016-03-16 DIAGNOSIS — C670 Malignant neoplasm of trigone of bladder: Secondary | ICD-10-CM | POA: Diagnosis not present

## 2016-03-16 DIAGNOSIS — N320 Bladder-neck obstruction: Secondary | ICD-10-CM | POA: Diagnosis not present

## 2016-03-16 DIAGNOSIS — I509 Heart failure, unspecified: Secondary | ICD-10-CM | POA: Diagnosis not present

## 2016-03-16 DIAGNOSIS — I4891 Unspecified atrial fibrillation: Secondary | ICD-10-CM | POA: Diagnosis not present

## 2016-04-09 DEATH — deceased

## 2016-05-17 ENCOUNTER — Ambulatory Visit: Payer: Medicare Other | Admitting: Nurse Practitioner
